# Patient Record
Sex: Female | Born: 1975 | Race: White | Hispanic: No | Marital: Married | Smoking: Never smoker
Health system: Southern US, Community
[De-identification: ages and names within clinical notes are randomized; demographics above are authoritative.]

## PROBLEM LIST (undated history)

## (undated) DIAGNOSIS — F32A Depression, unspecified: Secondary | ICD-10-CM

## (undated) DIAGNOSIS — G2581 Restless legs syndrome: Secondary | ICD-10-CM

## (undated) DIAGNOSIS — F329 Major depressive disorder, single episode, unspecified: Secondary | ICD-10-CM

## (undated) DIAGNOSIS — F319 Bipolar disorder, unspecified: Secondary | ICD-10-CM

## (undated) DIAGNOSIS — N912 Amenorrhea, unspecified: Secondary | ICD-10-CM

## (undated) DIAGNOSIS — L709 Acne, unspecified: Secondary | ICD-10-CM

## (undated) DIAGNOSIS — F419 Anxiety disorder, unspecified: Secondary | ICD-10-CM

## (undated) DIAGNOSIS — N979 Female infertility, unspecified: Secondary | ICD-10-CM

## (undated) DIAGNOSIS — E039 Hypothyroidism, unspecified: Secondary | ICD-10-CM

## (undated) HISTORY — PX: DILATION AND CURETTAGE OF UTERUS: SHX78

## (undated) HISTORY — DX: Female infertility, unspecified: N97.9

## (undated) HISTORY — PX: WISDOM TOOTH EXTRACTION: SHX21

## (undated) HISTORY — DX: Restless legs syndrome: G25.81

## (undated) HISTORY — DX: Acne, unspecified: L70.9

## (undated) HISTORY — DX: Anxiety disorder, unspecified: F41.9

## (undated) HISTORY — DX: Major depressive disorder, single episode, unspecified: F32.9

## (undated) HISTORY — DX: Amenorrhea, unspecified: N91.2

## (undated) HISTORY — DX: Depression, unspecified: F32.A

---

## 1998-03-31 ENCOUNTER — Emergency Department (HOSPITAL_COMMUNITY): Admission: EM | Admit: 1998-03-31 | Discharge: 1998-03-31 | Payer: Self-pay | Admitting: *Deleted

## 1998-06-18 ENCOUNTER — Inpatient Hospital Stay (HOSPITAL_COMMUNITY): Admission: EM | Admit: 1998-06-18 | Discharge: 1998-06-19 | Payer: Self-pay | Admitting: Emergency Medicine

## 1998-11-30 ENCOUNTER — Emergency Department (HOSPITAL_COMMUNITY): Admission: EM | Admit: 1998-11-30 | Discharge: 1998-11-30 | Payer: Self-pay | Admitting: Emergency Medicine

## 1999-03-07 ENCOUNTER — Inpatient Hospital Stay (HOSPITAL_COMMUNITY): Admission: AD | Admit: 1999-03-07 | Discharge: 1999-03-07 | Payer: Self-pay | Admitting: Obstetrics & Gynecology

## 1999-03-20 ENCOUNTER — Encounter: Payer: Self-pay | Admitting: Obstetrics & Gynecology

## 1999-03-20 ENCOUNTER — Inpatient Hospital Stay (HOSPITAL_COMMUNITY): Admission: AD | Admit: 1999-03-20 | Discharge: 1999-03-20 | Payer: Self-pay | Admitting: Obstetrics & Gynecology

## 1999-07-03 ENCOUNTER — Inpatient Hospital Stay (HOSPITAL_COMMUNITY): Admission: AD | Admit: 1999-07-03 | Discharge: 1999-07-03 | Payer: Self-pay | Admitting: Obstetrics and Gynecology

## 1999-08-13 ENCOUNTER — Observation Stay (HOSPITAL_COMMUNITY): Admission: AD | Admit: 1999-08-13 | Discharge: 1999-08-14 | Payer: Self-pay | Admitting: *Deleted

## 1999-09-11 ENCOUNTER — Encounter: Admission: RE | Admit: 1999-09-11 | Discharge: 1999-12-10 | Payer: Self-pay | Admitting: Obstetrics and Gynecology

## 1999-09-21 ENCOUNTER — Inpatient Hospital Stay (HOSPITAL_COMMUNITY): Admission: AD | Admit: 1999-09-21 | Discharge: 1999-09-21 | Payer: Self-pay | Admitting: Obstetrics and Gynecology

## 1999-09-26 ENCOUNTER — Inpatient Hospital Stay (HOSPITAL_COMMUNITY): Admission: AD | Admit: 1999-09-26 | Discharge: 1999-10-01 | Payer: Self-pay | Admitting: Obstetrics & Gynecology

## 1999-09-29 ENCOUNTER — Encounter: Payer: Self-pay | Admitting: Obstetrics and Gynecology

## 1999-10-09 ENCOUNTER — Observation Stay (HOSPITAL_COMMUNITY): Admission: AD | Admit: 1999-10-09 | Discharge: 1999-10-09 | Payer: Self-pay | Admitting: Obstetrics and Gynecology

## 1999-10-15 ENCOUNTER — Inpatient Hospital Stay (HOSPITAL_COMMUNITY): Admission: AD | Admit: 1999-10-15 | Discharge: 1999-10-17 | Payer: Self-pay | Admitting: Obstetrics & Gynecology

## 2000-03-10 ENCOUNTER — Emergency Department (HOSPITAL_COMMUNITY): Admission: EM | Admit: 2000-03-10 | Discharge: 2000-03-10 | Payer: Self-pay | Admitting: Emergency Medicine

## 2000-03-12 ENCOUNTER — Emergency Department (HOSPITAL_COMMUNITY): Admission: EM | Admit: 2000-03-12 | Discharge: 2000-03-12 | Payer: Self-pay | Admitting: Emergency Medicine

## 2002-06-18 ENCOUNTER — Other Ambulatory Visit: Admission: RE | Admit: 2002-06-18 | Discharge: 2002-06-18 | Payer: Self-pay | Admitting: Obstetrics & Gynecology

## 2004-09-12 ENCOUNTER — Other Ambulatory Visit: Admission: RE | Admit: 2004-09-12 | Discharge: 2004-09-12 | Payer: Self-pay | Admitting: Obstetrics & Gynecology

## 2004-12-28 ENCOUNTER — Ambulatory Visit: Payer: Self-pay | Admitting: Internal Medicine

## 2005-07-17 ENCOUNTER — Other Ambulatory Visit: Admission: RE | Admit: 2005-07-17 | Discharge: 2005-07-17 | Payer: Self-pay | Admitting: Obstetrics & Gynecology

## 2005-09-02 ENCOUNTER — Ambulatory Visit: Payer: Self-pay | Admitting: Internal Medicine

## 2006-05-02 ENCOUNTER — Ambulatory Visit: Payer: Self-pay | Admitting: Internal Medicine

## 2006-06-29 ENCOUNTER — Inpatient Hospital Stay (HOSPITAL_COMMUNITY): Admission: AD | Admit: 2006-06-29 | Discharge: 2006-06-29 | Payer: Self-pay | Admitting: Obstetrics and Gynecology

## 2006-11-03 ENCOUNTER — Ambulatory Visit: Payer: Self-pay | Admitting: Internal Medicine

## 2007-01-07 ENCOUNTER — Ambulatory Visit: Payer: Self-pay | Admitting: Internal Medicine

## 2007-05-05 ENCOUNTER — Ambulatory Visit: Payer: Self-pay | Admitting: Family Medicine

## 2007-08-12 DIAGNOSIS — N912 Amenorrhea, unspecified: Secondary | ICD-10-CM | POA: Insufficient documentation

## 2007-08-12 DIAGNOSIS — G43909 Migraine, unspecified, not intractable, without status migrainosus: Secondary | ICD-10-CM | POA: Insufficient documentation

## 2007-12-30 ENCOUNTER — Ambulatory Visit: Payer: Self-pay | Admitting: Internal Medicine

## 2007-12-30 DIAGNOSIS — J039 Acute tonsillitis, unspecified: Secondary | ICD-10-CM | POA: Insufficient documentation

## 2007-12-30 DIAGNOSIS — J069 Acute upper respiratory infection, unspecified: Secondary | ICD-10-CM | POA: Insufficient documentation

## 2007-12-30 LAB — CONVERTED CEMR LAB: Rapid Strep: NEGATIVE

## 2007-12-31 ENCOUNTER — Encounter: Payer: Self-pay | Admitting: Internal Medicine

## 2008-01-04 ENCOUNTER — Encounter: Payer: Self-pay | Admitting: Internal Medicine

## 2008-01-20 ENCOUNTER — Telehealth: Payer: Self-pay | Admitting: Internal Medicine

## 2008-05-24 ENCOUNTER — Telehealth: Payer: Self-pay | Admitting: Internal Medicine

## 2008-07-18 ENCOUNTER — Ambulatory Visit: Payer: Self-pay | Admitting: Internal Medicine

## 2008-07-18 DIAGNOSIS — R635 Abnormal weight gain: Secondary | ICD-10-CM | POA: Insufficient documentation

## 2008-07-18 DIAGNOSIS — N979 Female infertility, unspecified: Secondary | ICD-10-CM | POA: Insufficient documentation

## 2008-07-18 LAB — CONVERTED CEMR LAB
Thyroglobulin Ab: 38.5 (ref 0.0–60.0)
Thyroperoxidase Ab SerPl-aCnc: 2223.9 — ABNORMAL HIGH (ref 0.0–60.0)

## 2008-07-20 ENCOUNTER — Telehealth: Payer: Self-pay | Admitting: *Deleted

## 2008-07-26 ENCOUNTER — Ambulatory Visit (HOSPITAL_COMMUNITY): Admission: RE | Admit: 2008-07-26 | Discharge: 2008-07-26 | Payer: Self-pay | Admitting: Gynecology

## 2008-07-26 ENCOUNTER — Ambulatory Visit: Payer: Self-pay | Admitting: Internal Medicine

## 2008-07-26 LAB — CONVERTED CEMR LAB: Calcium, Total (PTH): 10.8 mg/dL — ABNORMAL HIGH (ref 8.4–10.5)

## 2008-07-27 ENCOUNTER — Telehealth: Payer: Self-pay | Admitting: Internal Medicine

## 2008-07-27 LAB — CONVERTED CEMR LAB
ALT: 32 units/L (ref 0–35)
AST: 30 units/L (ref 0–37)
Albumin: 4.3 g/dL (ref 3.5–5.2)
Alkaline Phosphatase: 60 units/L (ref 39–117)
BUN: 11 mg/dL (ref 6–23)
Basophils Absolute: 0 10*3/uL (ref 0.0–0.1)
Basophils Relative: 0.3 % (ref 0.0–3.0)
Bilirubin, Direct: 0.1 mg/dL (ref 0.0–0.3)
CO2: 31 meq/L (ref 19–32)
Calcium: 11.2 mg/dL — ABNORMAL HIGH (ref 8.4–10.5)
Chloride: 108 meq/L (ref 96–112)
Creatinine, Ser: 0.7 mg/dL (ref 0.4–1.2)
Eosinophils Absolute: 0.1 10*3/uL (ref 0.0–0.7)
Eosinophils Relative: 1.4 % (ref 0.0–5.0)
Free T4: 0.8 ng/dL (ref 0.6–1.6)
GFR calc Af Amer: 126 mL/min
GFR calc non Af Amer: 104 mL/min
Glucose, Bld: 89 mg/dL (ref 70–99)
HCT: 36.7 % (ref 36.0–46.0)
Hemoglobin: 13 g/dL (ref 12.0–15.0)
Lymphocytes Relative: 34 % (ref 12.0–46.0)
MCHC: 35.5 g/dL (ref 30.0–36.0)
MCV: 85.6 fL (ref 78.0–100.0)
Monocytes Absolute: 0.5 10*3/uL (ref 0.1–1.0)
Monocytes Relative: 6.6 % (ref 3.0–12.0)
Neutro Abs: 4.2 10*3/uL (ref 1.4–7.7)
Neutrophils Relative %: 57.7 % (ref 43.0–77.0)
Platelets: 288 10*3/uL (ref 150–400)
Potassium: 4 meq/L (ref 3.5–5.1)
RBC: 4.29 M/uL (ref 3.87–5.11)
RDW: 12.6 % (ref 11.5–14.6)
Sed Rate: 12 mm/hr (ref 0–22)
Sodium: 142 meq/L (ref 135–145)
T3, Free: 3.6 pg/mL (ref 2.3–4.2)
TSH: 4.24 microintl units/mL (ref 0.35–5.50)
Total Bilirubin: 0.5 mg/dL (ref 0.3–1.2)
Total Protein: 7.5 g/dL (ref 6.0–8.3)
WBC: 7.3 10*3/uL (ref 4.5–10.5)

## 2008-07-28 ENCOUNTER — Telehealth: Payer: Self-pay | Admitting: *Deleted

## 2008-08-05 ENCOUNTER — Telehealth: Payer: Self-pay | Admitting: Internal Medicine

## 2008-08-15 ENCOUNTER — Ambulatory Visit: Payer: Self-pay | Admitting: Internal Medicine

## 2008-08-15 DIAGNOSIS — J019 Acute sinusitis, unspecified: Secondary | ICD-10-CM | POA: Insufficient documentation

## 2008-12-05 ENCOUNTER — Encounter: Payer: Self-pay | Admitting: Internal Medicine

## 2009-01-02 ENCOUNTER — Ambulatory Visit: Payer: Self-pay | Admitting: Internal Medicine

## 2009-01-02 DIAGNOSIS — E069 Thyroiditis, unspecified: Secondary | ICD-10-CM | POA: Insufficient documentation

## 2009-01-02 DIAGNOSIS — R002 Palpitations: Secondary | ICD-10-CM | POA: Insufficient documentation

## 2009-01-03 ENCOUNTER — Encounter: Payer: Self-pay | Admitting: Internal Medicine

## 2009-01-03 LAB — CONVERTED CEMR LAB
Calcium, Total (PTH): 11.2 mg/dL — ABNORMAL HIGH (ref 8.4–10.5)
PTH: 46.8 pg/mL (ref 14.0–72.0)

## 2009-01-04 ENCOUNTER — Telehealth: Payer: Self-pay | Admitting: Internal Medicine

## 2009-01-05 ENCOUNTER — Ambulatory Visit: Payer: Self-pay | Admitting: Endocrinology

## 2009-01-05 DIAGNOSIS — R209 Unspecified disturbances of skin sensation: Secondary | ICD-10-CM | POA: Insufficient documentation

## 2009-01-05 DIAGNOSIS — E559 Vitamin D deficiency, unspecified: Secondary | ICD-10-CM | POA: Insufficient documentation

## 2009-01-05 LAB — CONVERTED CEMR LAB: Vitamin B-12: 333 pg/mL (ref 211–911)

## 2009-01-06 ENCOUNTER — Encounter: Payer: Self-pay | Admitting: Endocrinology

## 2009-01-06 LAB — CONVERTED CEMR LAB: Vit D, 25-Hydroxy: 28 ng/mL — ABNORMAL LOW (ref 30–89)

## 2009-01-09 ENCOUNTER — Encounter: Payer: Self-pay | Admitting: Endocrinology

## 2009-01-10 ENCOUNTER — Telehealth: Payer: Self-pay | Admitting: Endocrinology

## 2009-01-11 ENCOUNTER — Encounter: Payer: Self-pay | Admitting: Internal Medicine

## 2009-01-11 ENCOUNTER — Ambulatory Visit: Payer: Self-pay

## 2009-01-17 ENCOUNTER — Telehealth: Payer: Self-pay | Admitting: Internal Medicine

## 2009-10-27 ENCOUNTER — Encounter: Payer: Self-pay | Admitting: *Deleted

## 2009-10-27 ENCOUNTER — Encounter: Payer: Self-pay | Admitting: Internal Medicine

## 2009-11-06 ENCOUNTER — Ambulatory Visit: Payer: Self-pay | Admitting: Internal Medicine

## 2009-11-08 ENCOUNTER — Telehealth: Payer: Self-pay | Admitting: Endocrinology

## 2009-11-15 ENCOUNTER — Encounter: Payer: Self-pay | Admitting: *Deleted

## 2010-01-01 ENCOUNTER — Telehealth: Payer: Self-pay | Admitting: Internal Medicine

## 2010-12-16 LAB — CONVERTED CEMR LAB
CO2: 28 meq/L (ref 19–32)
Calcium: 10.6 mg/dL — ABNORMAL HIGH (ref 8.4–10.5)
Chloride: 108 meq/L (ref 96–112)
Creatinine, Ser: 0.6 mg/dL (ref 0.4–1.2)
Free T4: 0.6 ng/dL (ref 0.6–1.6)
Glucose, Bld: 81 mg/dL (ref 70–99)

## 2010-12-18 NOTE — Progress Notes (Signed)
Summary: yeast infection?  Phone Note Call from Patient   Caller: Patient Call For: Madelin Headings MD Complaint: Urinary/GYN Problems Summary of Call: Pt is asking for Difluclan for yeast infection (white discharge with itching and redness).  Was on an  antibiotic about 3 weeks ago. [phone] Initial call taken by: Lynann Beaver CMA,  January 01, 2010 10:28 AM  Follow-up for Phone Call        can use otc monistat   ,   or diflucan 150 Disp # 1  1 by mouth x1 . both work about the same.    recheck if persistent and progressive   symptoms Follow-up by: Madelin Headings MD,  January 01, 2010 5:39 PM    New/Updated Medications: DIFLUCAN 150 MG TABS (FLUCONAZOLE) one by mouth now Prescriptions: DIFLUCAN 150 MG TABS (FLUCONAZOLE) one by mouth now  #1 x 9   Entered by:   Lynann Beaver CMA   Authorized by:   Madelin Headings MD   Signed by:   Lynann Beaver CMA on 01/02/2010   Method used:   Electronically to        [facility]  [address] (retail)       [address]       [place], [place]  [address]       Ph: [phone]       Fax: [phone]   RxID:   [id number]  Pt. notified.

## 2011-01-02 ENCOUNTER — Other Ambulatory Visit: Payer: Self-pay | Admitting: Internal Medicine

## 2011-01-02 ENCOUNTER — Encounter: Payer: Self-pay | Admitting: Internal Medicine

## 2011-01-02 ENCOUNTER — Ambulatory Visit (INDEPENDENT_AMBULATORY_CARE_PROVIDER_SITE_OTHER): Payer: BC Managed Care – PPO | Admitting: Internal Medicine

## 2011-01-02 ENCOUNTER — Telehealth: Payer: Self-pay | Admitting: Internal Medicine

## 2011-01-02 DIAGNOSIS — R5381 Other malaise: Secondary | ICD-10-CM

## 2011-01-02 DIAGNOSIS — R5383 Other fatigue: Secondary | ICD-10-CM

## 2011-01-02 DIAGNOSIS — R252 Cramp and spasm: Secondary | ICD-10-CM

## 2011-01-02 DIAGNOSIS — E069 Thyroiditis, unspecified: Secondary | ICD-10-CM

## 2011-01-02 DIAGNOSIS — R002 Palpitations: Secondary | ICD-10-CM

## 2011-01-02 DIAGNOSIS — R635 Abnormal weight gain: Secondary | ICD-10-CM

## 2011-01-02 NOTE — Assessment & Plan Note (Signed)
Newer onset   R/o metabolic iron defic.

## 2011-01-02 NOTE — Progress Notes (Signed)
  Subjective:    Patient ID: Erica Larson, female    DOB: 1976-04-18, 35 y.o.   MRN: [id number]  HPI  patient comes in as an acute work in today upon request. She is having problems over the last couple weeks of extreme fatigue feeling like she has to go to sleep all the time and just exhausted. Has been one or 2 days where she felt a little off balance in the head but no falling treated due to his vertigo no weakness numbness and tingling. Gm died 45 of copd and cva  caretaking   .passed away a month ago  Felt better and then  Getting worse  Hard to sleep enough. Prev sleep pattern.   Was irreg  With sundowing of her gm .   She slept through the night the last week to 10 days but is still feeling tired. Her last visit with [place] was almost a year ago. Since that time she's been generally well went through infertility  Treatments with Dr. Vella Redhead that she is no longer going through and also sees the bariatric clinic at times. She believes her last lab work was in the fall. Pupils told her it was okay. She is no longer on thyroid medicine.  Review of Systems No fever, no weight loss, vision hearing  And cp sob.   Seeing nutritionist.   And jhas lost  18 pounds  .   Exercising.   Was going to the bariatic clinic .   Only ocassionally.   felt swimmy headed  drying hair and then hard to catch breath and then better and then  recurred when sitting at desk.    Increasing leg cramps mostly at night no numbness. Past Medical History  Diagnosis Date  . 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 Thyroiditis   . Acne    History reviewed. No pertinent past surgical history.  reports that she has never smoked. She does not have any smokeless tobacco history on file. She reports that she does not drink alcohol or use illicit drugs. family history includes Heart disease in her maternal grandmother; Nephrolithiasis in her father and mother; and Thyroid disease in her mother and sister.       Objective:   Physical Exam  well-developed well-nourished in no acute distress appears generally well slight proptosis but noted light her EOMs issue. HEENT normocephalic TMs clear eyes PERRLA EOMs full nares patent OP clear teeth in good repair neck without bruits lymphadenopathy thyroid is easily palpable nontender no nodules.    Chest: CTA BSE equal no wheezes rales or rhonchi. Cv: S1-S2 no gallops or murmurs peripheral pulses present without delay negative CCE.  Neurologic cranial nerves 3 through 12 appear intact no motor deficits Romberg negative heel-to-toe good finger to nose normal no tremor DTRs not abnormal and symmetrical.    Cognition and affect normal.       Assessment & Plan:    Fatigue:   probably reactive but need to rule out endocrine metabolic cause with her history of thyroiditis and hypercalcemia.  Also with her leg  cramps

## 2011-01-02 NOTE — Assessment & Plan Note (Signed)
recheck labs   Eyes  Look a bit proptotic but no lid lag.   Has lost 20 # intentionally and no other sx but sleepiness and fatigue.

## 2011-01-02 NOTE — Assessment & Plan Note (Signed)
better 

## 2011-01-02 NOTE — Assessment & Plan Note (Signed)
Improved    Had echo in past   Poss from  thryoid issue

## 2011-01-02 NOTE — Telephone Encounter (Signed)
Pt called and is complaining of extreme fatigue and not being able to sleep. This has been occuring for approx 2 wks.  Pt is req work in appt asap to see Dr Fabian Sharp.   Pls advise.

## 2011-01-02 NOTE — Patient Instructions (Signed)
Will notify you  of labs when available.     You have sleep deprivation still    A problem

## 2011-01-02 NOTE — Assessment & Plan Note (Signed)
Seems like increase sleep needs  And poss exhaustion and  Recovery from care taking and gm death and sleep deprivation  But need to recheck tyroid and calcium and iron status  With hx of same and some rls type sx.

## 2011-01-02 NOTE — Assessment & Plan Note (Signed)
Recheck labs today. 

## 2011-01-03 LAB — CBC WITH DIFFERENTIAL/PLATELET
Basophils Absolute: 0 10*3/uL (ref 0.0–0.1)
HCT: 38.3 % (ref 36.0–46.0)
Hemoglobin: 13 g/dL (ref 12.0–15.0)
Lymphs Abs: 2 10*3/uL (ref 0.7–4.0)
MCV: 86.8 fl (ref 78.0–100.0)
Monocytes Absolute: 0.4 10*3/uL (ref 0.1–1.0)
Monocytes Relative: 7 % (ref 3.0–12.0)
Neutro Abs: 3.2 10*3/uL (ref 1.4–7.7)
Platelets: 273 10*3/uL (ref 150.0–400.0)
RDW: 13.4 % (ref 11.5–14.6)

## 2011-01-03 LAB — BASIC METABOLIC PANEL
CO2: 27 mEq/L (ref 19–32)
Calcium: 10.7 mg/dL — ABNORMAL HIGH (ref 8.4–10.5)
Creatinine, Ser: 0.7 mg/dL (ref 0.4–1.2)
GFR: 102.04 mL/min (ref >60.00)
Glucose, Bld: 92 mg/dL (ref 70–99)

## 2011-01-03 LAB — SEDIMENTATION RATE: Sed Rate: 10 mm/hr (ref 0–22)

## 2011-01-03 LAB — IRON AND TIBC
%SAT: 21 % (ref 20–55)
Iron: 68 ug/dL (ref 42–145)
UIBC: 255 ug/dL

## 2011-01-03 LAB — FERRITIN: Ferritin: 37.5 ng/mL (ref 10.0–291.0)

## 2011-01-07 ENCOUNTER — Telehealth: Payer: Self-pay | Admitting: *Deleted

## 2011-01-07 NOTE — Telephone Encounter (Signed)
Tried to call pt back but was unable to leave a message.

## 2011-01-07 NOTE — Telephone Encounter (Signed)
Pt is returning shannon call please call [phone]

## 2011-01-07 NOTE — Telephone Encounter (Signed)
Message copied by Tor Netters on Mon Jan 07, 2011  9:12 AM ------      Message from: [facility] [place] [facility]      Created: Fri Jan 04, 2011  4:16 PM       Tell patient that labs are normal  except her calcium is still slightly  elevated .        Unsure if related to how she feels but would want you to follow up again about this  with endocrinology   We can do a referral back to endocrinologist of her choice.         Also because her iron level is low normal can try adding iron supplement daily to see if it helps  her legs at night .              Can follow up   Here if fatigue is not getting better after a month or so.

## 2011-01-07 NOTE — Telephone Encounter (Signed)
Left message to call back  

## 2011-01-07 NOTE — Telephone Encounter (Signed)
Pt aware of results. Pt wants to discuss going to a Endocrine with her husband and will call back to let us know.

## 2011-04-05 NOTE — Assessment & Plan Note (Signed)
[facility]                                 ON-CALL NOTE   NAME:Erica Larson, AKANKSHA                        MRN:          [id number]  DATE:09/20/2007                            DOB:          1976-07-19    PHONE NUMBER:  [phone]   SUBJECTIVE:  The patient complains of sharp pain in the left breast with  the right arm being numb.  She started this morning when she woke up  with stabbing pain through the chest on the left side.  She took an  aspirin, went to church, and it improved slightly.  She came home and  slept for a little while and now has woken up with numbness of her right  arm.  While talking with me, she is complaining of numbness of her left  leg as well.   OBJECTIVE:  Chest pain of unknown etiology.   PLAN:  With the constellation of symptoms she has, I do not know what is  going on.  She sounds very comfortable verbally over the phone, but  would have her go to the emergency room for evaluation.   PRIMARY CARE PHYSICIAN:  Neta Mends. Panosh, M.D., home office is  [place].     Arta Silence, MD  Electronically Signed    RNS/MedQ  DD: 09/20/2007  DT: 09/21/2007  Job #: [id number]

## 2011-04-05 NOTE — Discharge Summary (Signed)
[facility]  Patient:    Erica Larson Visit Number: [id number] MRN: [id number]          Service Type: NMG Location: [facility] Attending Physician:  Osborn Coho Dictated by:   Leilani Able, P.A. Admit Date:  09/11/1999 Disc. Date: 10/09/99                             Discharge Summary  FINAL DIAGNOSIS:              35+ week gestation in questionable labor.  This 35 year old, G1, P0, was admitted at 35+ weeks.  The patients prenatal course was complicated by gestational diabetes mellitus which was diet controlled and  history of preterm labor.  The patient was complaining of contractions.  Her cervix was 3 cm dilated, 50% effaced, and a -2 station.  The patient was questionably n early labor.  She was admitted for observation at this point.  The patient was allowed to walk.  She returned without any cervical change.  At this point, she was discharged home undelivered.  She was sent home with one Ambien 10 mg p.o. to take when she gets home if needed and told to follow up for her next scheduled appointment at the office. Dictated by:   Leilani Able, P.A. Attending Physician:  Osborn Coho DD:  11/16/99 TD:  11/17/99 Job: [id number]

## 2011-04-05 NOTE — Discharge Summary (Signed)
[facility]  Patient:    Erica Larson                          MRN: [id number] Adm. Date:  [id number] Disc. Date: 10/01/99 Attending:  Osborn Coho Dictator:   Leilani Able, P.A.                           Discharge Summary  FINAL DIAGNOSIS:              Intrauterine pregnancy at 34 weeks estimated gestational age, preterm labor.  HISTORY:                      This 35 year old G1, P0 was admitted at around 33-6/7ths weeks in preterm labor.  The patient had had multiple episodes of preterm labor since [redacted] weeks gestation.  She was treated with hospital admission, subcu  terbutaline, oral terbutaline, and bed rest. Today she presented to the office ith regular uterine contractions.  Her cervix was noted to be about 1-2 cm dilated, 50% effaced, and a full lower segment.  The patient was sent to triage where her contractions were increasing.  They were regular and painful.  The patient did ot have any response with subcu terbutaline at this point and therefore was admitted for IV magnesium sulfate.  HOSPITAL COURSE:              The patient was admitted, started on magnesium sulfate, was begun on betamethasone protocol and started on Unasyn 1 g q.6h. The patients contractions resolved and on November 9, she was stopped on her magnesium sulfate and started on Procardia but her contractions restarted again and the magnesium sulfate was begun again.  On November 11, patient had been off the magnesium sulfate for about 24 hours and was on oral terbutaline.  She started o have contractions at this point and it was felt to proceed with an amnio to check on fetal lung maturity.  The amnio revealed fetal lung maturity to be negative.  The patient was felt ready for discharge on hospital day #6.  She was doing well off her magnesium sulfate.  She was not having any contractions.  Cervix had not changed.  The patient was sent home on bed  rest, terbutaline 2.5 to 5 mg one q.4h. She was told to follow up in the office in one week. DD:  11/16/99 TD:  11/17/99 Job: [id number]

## 2011-04-10 ENCOUNTER — Encounter: Payer: Self-pay | Admitting: Internal Medicine

## 2011-04-10 ENCOUNTER — Ambulatory Visit: Payer: BC Managed Care – PPO | Admitting: Family Medicine

## 2011-04-11 ENCOUNTER — Ambulatory Visit: Payer: BC Managed Care – PPO | Admitting: Internal Medicine

## 2011-04-11 DIAGNOSIS — Z0289 Encounter for other administrative examinations: Secondary | ICD-10-CM

## 2011-06-20 ENCOUNTER — Encounter: Payer: Self-pay | Admitting: Family Medicine

## 2011-06-20 ENCOUNTER — Ambulatory Visit (INDEPENDENT_AMBULATORY_CARE_PROVIDER_SITE_OTHER): Payer: BC Managed Care – PPO | Admitting: Family Medicine

## 2011-06-20 VITALS — BP 100/70 | Temp 98.6°F | Wt 154.0 lb

## 2011-06-20 DIAGNOSIS — IMO0002 Reserved for concepts with insufficient information to code with codable children: Secondary | ICD-10-CM

## 2011-06-20 DIAGNOSIS — M545 Low back pain: Secondary | ICD-10-CM

## 2011-06-20 DIAGNOSIS — M79605 Pain in left leg: Secondary | ICD-10-CM

## 2011-06-20 MED ORDER — TRAMADOL HCL 50 MG PO TABS: 50.0000 mg | ORAL_TABLET | Freq: Four times a day (QID) | ORAL | Status: AC | PRN

## 2011-06-20 NOTE — Patient Instructions (Signed)
Do stretches as instructed. May continue with ibuprofen. Be in touch in 2 weeks if no better and sooner if any weakness or worsening pain.

## 2011-06-20 NOTE — Progress Notes (Signed)
  Subjective:    Patient ID: Erica Larson, female    DOB: Jul 16, 1976, 35 y.o.   MRN: [id number]  HPI Low back pain. Duration 4 weeks. Location left lumbar. Over the past several days radiation left anterior thigh down to the knee. No weakness. No numbness. No urine or stool incontinence. Initially thought UTI went to urgent care with normal urine. Pain is constant. Sharp stabbing pain. 7-8/10 in severity. Took husband's tramadol which helped some. No history of injury. Symptoms worse at night when supine. Advil without relief. No prior history of back problems. No appetite or weight changes.   Review of Systems  Constitutional: Negative for fever, chills, activity change, appetite change and unexpected weight change.  Gastrointestinal: Negative for abdominal pain.  Genitourinary: Negative for dysuria.  Musculoskeletal: Positive for back pain. Negative for arthralgias.  Skin: Negative for rash.  Neurological: Negative for weakness.  Hematological: Negative for adenopathy.       Objective:   Physical Exam  Constitutional: She appears well-developed and well-nourished. No distress.  Cardiovascular: Normal rate and regular rhythm.   Pulmonary/Chest: Effort normal and breath sounds normal. No respiratory distress. She has no wheezes. She has no rales.  Musculoskeletal: She exhibits no edema.       Straight leg raise left produces some pain upper thigh region.  tenderness left lower lumbar region  Neurological:       Full-strength lower extremities. Symmetric reflexes knee and ankle bilaterally No muscle atrophy  Skin: No rash noted.          Assessment & Plan:  Low-back pain with left radiculopathy symptoms. Nonfocal neuro exam. Symptoms suggest possible disc bulge or herniation. Tramadol 1-2 every 6 hours for pain relief. Continue Advil. Simple extension stretches given. Consider MRI lumbosacral spine in 2 weeks if no better and sooner as needed

## 2011-12-30 ENCOUNTER — Other Ambulatory Visit: Payer: Self-pay | Admitting: Family Medicine

## 2011-12-31 NOTE — Telephone Encounter (Signed)
Dr Panosh pt 

## 2011-12-31 NOTE — Telephone Encounter (Signed)
I have not prescribed this med for her and last visit with me was a year ago. Given by Dr B for a back problem .  Advise ROVto evaluate for refill .

## 2011-12-31 NOTE — Telephone Encounter (Signed)
Pt aware of this and didn't request the refill on this medication.

## 2013-02-18 ENCOUNTER — Other Ambulatory Visit (HOSPITAL_COMMUNITY): Payer: Self-pay | Admitting: Internal Medicine

## 2013-02-18 ENCOUNTER — Ambulatory Visit (HOSPITAL_COMMUNITY)
Admission: RE | Admit: 2013-02-18 | Discharge: 2013-02-18 | Disposition: A | Discharge status: Home / Self Care | Payer: BC Managed Care – PPO | Source: Ambulatory Visit | Attending: Internal Medicine | Admitting: Internal Medicine

## 2013-02-18 DIAGNOSIS — M25571 Pain in right ankle and joints of right foot: Secondary | ICD-10-CM

## 2013-02-18 DIAGNOSIS — M79609 Pain in unspecified limb: Secondary | ICD-10-CM | POA: Insufficient documentation

## 2013-09-20 ENCOUNTER — Emergency Department (INDEPENDENT_AMBULATORY_CARE_PROVIDER_SITE_OTHER)
Admission: EM | Admit: 2013-09-20 | Discharge: 2013-09-20 | Disposition: A | Discharge status: Home / Self Care | Payer: BC Managed Care – PPO | Source: Home / Self Care | Attending: Family Medicine | Admitting: Family Medicine

## 2013-09-20 ENCOUNTER — Emergency Department (HOSPITAL_COMMUNITY)
Admission: EM | Admit: 2013-09-20 | Discharge: 2013-09-20 | Discharge status: Against Medical Advice | Payer: BC Managed Care – PPO | Attending: Emergency Medicine | Admitting: Emergency Medicine

## 2013-09-20 ENCOUNTER — Encounter (HOSPITAL_COMMUNITY): Payer: Self-pay | Admitting: Emergency Medicine

## 2013-09-20 ENCOUNTER — Emergency Department (INDEPENDENT_AMBULATORY_CARE_PROVIDER_SITE_OTHER): Payer: BC Managed Care – PPO

## 2013-09-20 DIAGNOSIS — Z8679 Personal history of other diseases of the circulatory system: Secondary | ICD-10-CM | POA: Insufficient documentation

## 2013-09-20 DIAGNOSIS — Z8742 Personal history of other diseases of the female genital tract: Secondary | ICD-10-CM | POA: Insufficient documentation

## 2013-09-20 DIAGNOSIS — F419 Anxiety disorder, unspecified: Secondary | ICD-10-CM

## 2013-09-20 DIAGNOSIS — R209 Unspecified disturbances of skin sensation: Secondary | ICD-10-CM | POA: Insufficient documentation

## 2013-09-20 DIAGNOSIS — Z79899 Other long term (current) drug therapy: Secondary | ICD-10-CM | POA: Insufficient documentation

## 2013-09-20 DIAGNOSIS — R0602 Shortness of breath: Secondary | ICD-10-CM

## 2013-09-20 DIAGNOSIS — F411 Generalized anxiety disorder: Secondary | ICD-10-CM | POA: Insufficient documentation

## 2013-09-20 DIAGNOSIS — E069 Thyroiditis, unspecified: Secondary | ICD-10-CM | POA: Insufficient documentation

## 2013-09-20 DIAGNOSIS — R51 Headache: Secondary | ICD-10-CM | POA: Insufficient documentation

## 2013-09-20 DIAGNOSIS — Z872 Personal history of diseases of the skin and subcutaneous tissue: Secondary | ICD-10-CM | POA: Insufficient documentation

## 2013-09-20 LAB — GLUCOSE, CAPILLARY: Glucose-Capillary: 96 mg/dL (ref 70–99)

## 2013-09-20 MED ORDER — ALBUTEROL SULFATE (5 MG/ML) 0.5% IN NEBU
INHALATION_SOLUTION | RESPIRATORY_TRACT | Status: AC
  Filled 2013-09-20: qty 1

## 2013-09-20 MED ORDER — IPRATROPIUM BROMIDE 0.02 % IN SOLN
0.5000 mg | Freq: Once | RESPIRATORY_TRACT | Status: AC
  Administered 2013-09-20: 0.5 mg via RESPIRATORY_TRACT

## 2013-09-20 MED ORDER — ALBUTEROL SULFATE (5 MG/ML) 0.5% IN NEBU
5.0000 mg | INHALATION_SOLUTION | Freq: Once | RESPIRATORY_TRACT | Status: AC
  Administered 2013-09-20: 5 mg via RESPIRATORY_TRACT

## 2013-09-20 MED ORDER — IPRATROPIUM BROMIDE 0.02 % IN SOLN
RESPIRATORY_TRACT | Status: AC
  Filled 2013-09-20: qty 2.5

## 2013-09-20 MED ORDER — SODIUM CHLORIDE 0.9 % IV SOLN: Freq: Once | INTRAVENOUS | Status: DC

## 2013-09-20 NOTE — ED Notes (Signed)
Pt left AMA without notifying staff.  

## 2013-09-20 NOTE — ED Notes (Signed)
Patient dialed husbands number and gave phone to this nurse to talk to spouse, spouse is coming
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
## 2013-09-20 NOTE — ED Notes (Signed)
Patient agreeable to go by shuttle to ed for evaluation

## 2013-09-20 NOTE — ED Notes (Signed)
No improvement, reports numbness in extremities.  Instructed patient this is typical with her breathing pattern.
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
## 2013-09-20 NOTE — ED Notes (Signed)
CBG is 96. Notified Nurse Ashleigh.

## 2013-09-20 NOTE — ED Notes (Signed)
Pt instructed to try to slow down breathing.

## 2013-09-20 NOTE — ED Notes (Signed)
Dr. Effie Shy notified of pt departure.

## 2013-09-20 NOTE — ED Notes (Signed)
Shuttle not available
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
## 2013-09-22 ENCOUNTER — Ambulatory Visit (INDEPENDENT_AMBULATORY_CARE_PROVIDER_SITE_OTHER): Payer: BC Managed Care – PPO | Admitting: Psychiatry

## 2013-09-22 DIAGNOSIS — F419 Anxiety disorder, unspecified: Secondary | ICD-10-CM

## 2013-09-22 DIAGNOSIS — F411 Generalized anxiety disorder: Secondary | ICD-10-CM

## 2013-09-22 DIAGNOSIS — F39 Unspecified mood [affective] disorder: Secondary | ICD-10-CM

## 2013-09-24 ENCOUNTER — Encounter (HOSPITAL_COMMUNITY): Payer: Self-pay | Admitting: Psychiatry

## 2013-09-24 NOTE — Patient Instructions (Signed)
Discussed orally 
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
## 2013-09-27 ENCOUNTER — Ambulatory Visit (INDEPENDENT_AMBULATORY_CARE_PROVIDER_SITE_OTHER): Payer: BC Managed Care – PPO | Admitting: Psychiatry

## 2013-09-27 ENCOUNTER — Encounter (HOSPITAL_COMMUNITY): Payer: Self-pay | Admitting: Psychiatry

## 2013-09-27 VITALS — BP 110/80 | Ht 66.0 in | Wt 157.0 lb

## 2013-09-27 DIAGNOSIS — F410 Panic disorder [episodic paroxysmal anxiety] without agoraphobia: Secondary | ICD-10-CM

## 2013-09-27 DIAGNOSIS — F411 Generalized anxiety disorder: Secondary | ICD-10-CM

## 2013-09-27 MED ORDER — CLONAZEPAM 0.5 MG PO TABS: ORAL_TABLET | ORAL | Status: DC
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
## 2013-10-01 ENCOUNTER — Ambulatory Visit (HOSPITAL_COMMUNITY): Payer: Self-pay | Admitting: Psychiatry

## 2013-10-12 ENCOUNTER — Telehealth (HOSPITAL_COMMUNITY): Payer: Self-pay

## 2013-10-13 ENCOUNTER — Telehealth (HOSPITAL_COMMUNITY): Payer: Self-pay

## 2013-10-18 ENCOUNTER — Ambulatory Visit (INDEPENDENT_AMBULATORY_CARE_PROVIDER_SITE_OTHER): Payer: BC Managed Care – PPO | Admitting: Psychiatry

## 2013-10-18 DIAGNOSIS — F411 Generalized anxiety disorder: Secondary | ICD-10-CM

## 2013-10-18 DIAGNOSIS — F419 Anxiety disorder, unspecified: Secondary | ICD-10-CM

## 2013-10-18 DIAGNOSIS — F39 Unspecified mood [affective] disorder: Secondary | ICD-10-CM

## 2013-10-18 NOTE — Patient Instructions (Signed)
Discussed orally 
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
## 2013-10-22 ENCOUNTER — Ambulatory Visit (HOSPITAL_COMMUNITY): Payer: Self-pay | Admitting: Psychiatry

## 2013-10-25 ENCOUNTER — Ambulatory Visit (HOSPITAL_COMMUNITY): Payer: Self-pay | Admitting: Psychiatry

## 2013-11-04 ENCOUNTER — Ambulatory Visit (HOSPITAL_COMMUNITY): Payer: Self-pay | Admitting: Psychiatry

## 2013-11-05 ENCOUNTER — Ambulatory Visit (INDEPENDENT_AMBULATORY_CARE_PROVIDER_SITE_OTHER): Payer: BC Managed Care – PPO | Admitting: Psychiatry

## 2013-11-05 ENCOUNTER — Encounter (HOSPITAL_COMMUNITY): Payer: Self-pay | Admitting: Psychiatry

## 2013-11-05 VITALS — BP 110/80 | Ht 66.0 in | Wt 154.0 lb

## 2013-11-05 DIAGNOSIS — F419 Anxiety disorder, unspecified: Secondary | ICD-10-CM

## 2013-11-05 DIAGNOSIS — F410 Panic disorder [episodic paroxysmal anxiety] without agoraphobia: Secondary | ICD-10-CM

## 2013-11-05 MED ORDER — ZOLPIDEM TARTRATE 5 MG PO TABS: 5.0000 mg | ORAL_TABLET | Freq: Every evening | ORAL | Status: DC | PRN
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
## 2013-11-19 ENCOUNTER — Ambulatory Visit (HOSPITAL_COMMUNITY): Payer: Self-pay | Admitting: Psychiatry

## 2013-11-23 ENCOUNTER — Ambulatory Visit (INDEPENDENT_AMBULATORY_CARE_PROVIDER_SITE_OTHER): Payer: BC Managed Care – PPO | Admitting: Psychiatry

## 2013-11-23 DIAGNOSIS — F411 Generalized anxiety disorder: Secondary | ICD-10-CM

## 2013-11-23 DIAGNOSIS — F39 Unspecified mood [affective] disorder: Secondary | ICD-10-CM

## 2013-11-23 DIAGNOSIS — F419 Anxiety disorder, unspecified: Secondary | ICD-10-CM
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
## 2013-11-23 NOTE — Patient Instructions (Signed)
Discussed orally 

## 2013-11-25 ENCOUNTER — Ambulatory Visit (HOSPITAL_COMMUNITY): Payer: Self-pay | Admitting: Psychiatry

## 2013-12-20 ENCOUNTER — Ambulatory Visit (INDEPENDENT_AMBULATORY_CARE_PROVIDER_SITE_OTHER): Payer: BC Managed Care – PPO | Admitting: Psychiatry

## 2013-12-20 DIAGNOSIS — F411 Generalized anxiety disorder: Secondary | ICD-10-CM

## 2013-12-20 DIAGNOSIS — F419 Anxiety disorder, unspecified: Secondary | ICD-10-CM

## 2013-12-20 NOTE — Patient Instructions (Signed)
Discussed orally 
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
## 2014-01-03 ENCOUNTER — Telehealth (HOSPITAL_COMMUNITY): Payer: Self-pay | Admitting: *Deleted

## 2014-01-04 ENCOUNTER — Ambulatory Visit (HOSPITAL_COMMUNITY): Payer: Self-pay | Admitting: Psychiatry

## 2014-01-19 ENCOUNTER — Ambulatory Visit (INDEPENDENT_AMBULATORY_CARE_PROVIDER_SITE_OTHER): Payer: BC Managed Care – PPO | Admitting: Psychiatry

## 2014-01-19 DIAGNOSIS — F411 Generalized anxiety disorder: Secondary | ICD-10-CM

## 2014-01-19 DIAGNOSIS — F419 Anxiety disorder, unspecified: Secondary | ICD-10-CM
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
## 2014-01-19 NOTE — Patient Instructions (Signed)
Discussed orally 

## 2014-01-31 ENCOUNTER — Encounter (HOSPITAL_COMMUNITY): Payer: Self-pay | Admitting: Psychiatry

## 2014-01-31 ENCOUNTER — Ambulatory Visit (HOSPITAL_COMMUNITY): Payer: Self-pay | Admitting: Psychiatry

## 2014-02-02 ENCOUNTER — Ambulatory Visit (INDEPENDENT_AMBULATORY_CARE_PROVIDER_SITE_OTHER): Payer: BC Managed Care – PPO | Admitting: Psychiatry

## 2014-02-02 DIAGNOSIS — F411 Generalized anxiety disorder: Secondary | ICD-10-CM

## 2014-02-02 DIAGNOSIS — F419 Anxiety disorder, unspecified: Secondary | ICD-10-CM

## 2014-02-02 NOTE — Progress Notes (Signed)
   THERAPIST PROGRESS NOTE  Session Time: Wednesday 02/02/2014 1:05 PM -1:55 PM  Participation Level: Active  Behavioral Response: CasualAlertEuthymic  Type of Therapy: Individual Therapy  Treatment Goals addressed:       Increase self acceptance                                                          improve assertiveness skills and ability to set and maintain boundaries                                                          Improve mood and decrease anxiety response   Interventions: CBT, Assertiveness Training and Supportive  Summary: Erica Larson is a 38 y.o. female who presents  with a history of anxiety and anger outbursts that have been intermittent for several years. Patient reports often feeling enraged and out of control. She reports significant marital stress and trust issues in her marriage. Patient reports improved mood and decreased anxiety since last session 2 weeks ago. She also reports decreased marital stress as she and husband haven't argued as much as have been doing pleasant activities as a couple. Patient has increased efforts to improve assertiveness skills in communication with husband and shares examples. She also has set boundaries with her mother. Patient continues to experience anxiety and perfectionistic tendencies about self and performance. She is fearful of making mistakes, worries about other's opinions, and has tendency to seek others' approval. She shares more information regarding childhood relationship with parents including dad being very caring and supportive and mother being very critical and negative.  Suicidal/Homicidal: No  Therapist Response: Therapist works with patient to reinforce use of assertiveness skills, discuss the effects of use, begin to identify thinking blockers and helpers for effective assertion, identify automatic thoughts and initial sources along with effects on patient's functioning. Therapist provides patient with handouts to  improve assertiveness skills.  Plan: Return again in 2 weeks.   Diagnosis: Axis I: Anxiety Disorder    Axis II: Deferred    BYNUM,PEGGY, LCSW 02/02/2014

## 2014-02-02 NOTE — Patient Instructions (Signed)
Discussed orally 

## 2014-02-16 ENCOUNTER — Ambulatory Visit (INDEPENDENT_AMBULATORY_CARE_PROVIDER_SITE_OTHER): Payer: BC Managed Care – PPO | Admitting: Psychiatry

## 2014-02-16 DIAGNOSIS — F419 Anxiety disorder, unspecified: Secondary | ICD-10-CM

## 2014-02-16 DIAGNOSIS — F411 Generalized anxiety disorder: Secondary | ICD-10-CM

## 2014-02-16 NOTE — Patient Instructions (Signed)
Discussed orally 

## 2014-02-16 NOTE — Progress Notes (Signed)
   THERAPIST PROGRESS NOTE  Session Time: Wednesday 02/16/2014 02/16/2014 9:10 AM - 10:00 AM  Participation Level: Active  Behavioral Response: CasualAlertEuthymic  Type of Therapy: Individual Therapy  Treatment Goals addressed:  Increase self acceptance       Improve assertiveness skills and ability to set and maintain boundaries       Improve mood and decrease anxiety response   Interventions: CBT and Supportive  Summary: Erica Larson is a 38 y.o. female who presents with a history of anxiety and anger outbursts that have been intermittent for several years. Patient reports often feeling enraged and out of control. She reports significant marital stress and trust issues in her marriage. Patient reports continued improved mood and decreased anxiety since last session 2 weeks ago. 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 She reports husband has been more stressed and has made some snide remarks. However, patient has been able to manage this fairly well and has not had the anxiety response. She reports less concern about his approval. She's continued to maintain boundaries with her daughter.    Suicidal/Homicidal: No  Therapist Response: Therapist works with patient to process feelings, reinforce patient's efforts to set and maintain boundaries, and identify coping statements.  Plan: Return again in 3 weeks.  Diagnosis: Axis I: Anxiety Disorder    Axis II: No diagnosis    Khan Chura, LCSW 02/16/2014

## 2014-03-03 ENCOUNTER — Ambulatory Visit (INDEPENDENT_AMBULATORY_CARE_PROVIDER_SITE_OTHER): Payer: BC Managed Care – PPO | Admitting: Psychiatry

## 2014-03-03 DIAGNOSIS — F411 Generalized anxiety disorder: Secondary | ICD-10-CM

## 2014-03-03 DIAGNOSIS — F419 Anxiety disorder, unspecified: Secondary | ICD-10-CM

## 2014-03-03 NOTE — Progress Notes (Signed)
   THERAPIST PROGRESS NOTE  Session Time: Thursday 03/03/2014 9:05 AM - 9:55 AM  Participation Level: Active  Behavioral Response: CasualAlertAnxious  Type of Therapy: Individual Therapy  Treatment Goals addressed:  Increase self acceptance       Improve assertiveness skills and ability to set and maintain boundaries       Improve mood and decrease anxiety response   Interventions: Supportive  Summary: Erica Larson is a 38 y.o. female who presents with a history of anxiety and anger outbursts that have been intermittent for several years. Patient reports often feeling enraged and out of control. She reports significant marital stress and trust issues in her marriage. Patient reports increased stress since last session 2 weeks ago. She and husband have experienced increased conflict as husband continues to accuse patient of lying which she denies. She also reports feeling pressure from husband to attend one of her sessions as he doesn't think she's being honest in therapy. Patient expresses ambivalent feelings about husband attending a session.She wants marriage to improve but is fearful of husband's possible reaction as he may paint her as the bad guy and become even more argumentative after session.   Suicidal/Homicidal: No  Therapist Response: Therapist works with patient to process feelings, identify limits of her responsibility, began to explore the costs and benefits of having a session with husband, explore ways to improve distress tolerance  Plan: Return again in 2 weeks.  Diagnosis: Axis I: Anxiety Disorder NOS    Axis II: Deferred    Fadia Marlar, LCSW 03/03/2014

## 2014-03-03 NOTE — Patient Instructions (Signed)
Discussed orally 

## 2014-03-18 ENCOUNTER — Ambulatory Visit (INDEPENDENT_AMBULATORY_CARE_PROVIDER_SITE_OTHER): Payer: BC Managed Care – PPO | Admitting: Psychiatry

## 2014-03-18 DIAGNOSIS — F411 Generalized anxiety disorder: Secondary | ICD-10-CM

## 2014-03-18 DIAGNOSIS — F419 Anxiety disorder, unspecified: Secondary | ICD-10-CM

## 2014-03-18 NOTE — Progress Notes (Signed)
   THERAPIST PROGRESS NOTE  Session Time:  Friday 03/18/2014 9:05 AM - 10:00 AM  Participation Level: Active  Behavioral Response: CasualAlertAnxious  Type of Therapy: Individual Therapy  Treatment Goals addressed: Increase self acceptance  Improve assertiveness skills and ability to set and maintain boundaries  Improve mood and decrease anxiety response     Interventions: CBT and Supportive  Summary: Erica Larson is a 38 y.o. female who presents with a history of anxiety and anger outbursts that have been intermittent for several years. Patient reports often feeling enraged and out of control. She reports significant marital stress and trust issues in her marriage. She reports decreased conflict in marriage since last session. She continues to express frustration and anxiety regarding inconsistent positive interaction in marriage but remains reluctant to have joint session with husband. She fears husband will put her down and then argue after session. 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 She fears mistakes and failures,   Suicidal/Homicidal: No  Therapist Response: Therapist works with patient to process feelings, examine thought patterns, identify coping statements to overcome perfectionism. Therapist provides patient with handouts.  Plan: Return again in 4 weeks.   Diagnosis: Axis I: Anxiety Disorder NOS    Axis II: Deferred    Umberto Pavek, LCSW 03/18/2014

## 2014-03-18 NOTE — Patient Instructions (Signed)
Discussed orally 

## 2014-04-14 ENCOUNTER — Ambulatory Visit (INDEPENDENT_AMBULATORY_CARE_PROVIDER_SITE_OTHER): Payer: BC Managed Care – PPO | Admitting: Psychiatry

## 2014-04-14 DIAGNOSIS — F411 Generalized anxiety disorder: Secondary | ICD-10-CM

## 2014-04-14 DIAGNOSIS — F419 Anxiety disorder, unspecified: Secondary | ICD-10-CM

## 2014-04-14 NOTE — Progress Notes (Signed)
   THERAPIST PROGRESS NOTE  Session Time: Thursday 04/14/2014 1:05 PM - 2:00 PM  Participation Level: Active  Behavioral Response: CasualAlert/less anxious  Type of Therapy: Individual Therapy  Treatment Goals addressed: Increase self acceptance  Improve assertiveness skills and ability to set and maintain boundaries  Improve mood and decrease anxiety response    Interventions: CBT and Supportive  Summary: Erica Larson is a 38 y.o. female who presents with a history of anxiety and anger outbursts that have been intermittent for several years. Patient reports often feeling enraged and out of control. She reports significant marital stress and trust issues in her marriage. Since last session, patient reports increased awareness of perfectionistic thinking. She has begun coping statements to overcome perfectionism and reports reduced anxiety being able to let some things go. She reports continued decreased conflict in marriage but still continues to worry about husband's opinion and assumes husband blames her for everything. She continues to struggle with self-acceptance. 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 Although she is concerned about husband's potential comments, she wants to include husband in next session to improve their communication.   Suicidal/Homicidal: No  Therapist Response:Therapist works with patient to process feelings, identify automatic thoughts/core beliefs/ and effects on patient's mood/behaviors, dispute irrational core beliefs, and practice a grounding technique.   Plan: Return again in 2 weeks.  Diagnosis: Axis I: Anxiety Disorder    Axis II: No diagnosis    Maudean Hoffmann, LCSW 04/14/2014

## 2014-04-14 NOTE — Patient Instructions (Signed)
Discussed orally 

## 2014-04-28 ENCOUNTER — Ambulatory Visit (INDEPENDENT_AMBULATORY_CARE_PROVIDER_SITE_OTHER): Payer: BC Managed Care – PPO | Admitting: Psychiatry

## 2014-04-28 DIAGNOSIS — F419 Anxiety disorder, unspecified: Secondary | ICD-10-CM

## 2014-04-28 DIAGNOSIS — F411 Generalized anxiety disorder: Secondary | ICD-10-CM

## 2014-04-28 NOTE — Progress Notes (Signed)
   THERAPIST PROGRESS NOTE  Session Time: Thursday 04/28/2014 9:15 AM - 10:10 AM  Participation Level: Active  Behavioral Response: CasualAlertAnxious  Type of Therapy: Individual Therapy  Treatment Goals addressed:  Increase self acceptance          Improve assertiveness skills and ability to set and maintain boundaries                     Improve mood and decrease anxiety response      Interventions: Supportive  Summary: Erica Larson is a 38 y.o. female who presents with a history of anxiety and anger outbursts that have been intermittent for several years. Patient reports often feeling enraged and out of control. She reports significant marital stress and trust issues in her marriage. Since last session, patient reports increased efforts to use assertiveness skills and to express her concerns. She also reports decreased anxiety. Patient's husband attends part of session today and expresses frustration regarding poor communication in marriage. Both patient and husband admit trust issues due to communication problems.  Suicidal/Homicidal: No  Therapist Response: Therapist works with patient and husband to facilitate improved communication including empathic skills and more support for patient. Therapist also refers patient and husband to psychologist Dr. Kieth Brightly for family therapy.  Plan: Return again in 2 weeks.  Diagnosis: Axis I: Anxiety Disorder NOS    Axis II: No diagnosis    Shamecka Hocutt, LCSW 04/28/2014

## 2014-04-28 NOTE — Patient Instructions (Signed)
Discussed orally 

## 2014-05-13 ENCOUNTER — Ambulatory Visit (INDEPENDENT_AMBULATORY_CARE_PROVIDER_SITE_OTHER): Payer: BC Managed Care – PPO | Admitting: Psychiatry

## 2014-05-13 DIAGNOSIS — F411 Generalized anxiety disorder: Secondary | ICD-10-CM

## 2014-05-13 NOTE — Patient Instructions (Signed)
Discussed orally 

## 2014-05-13 NOTE — Progress Notes (Signed)
   THERAPIST PROGRESS NOTE  Session Time: Friday 05/13/2014 9:10 AM -10:00 AM  Participation Level: Active  Behavioral Response: CasualAlertAnxious  Type of Therapy: Individual Therapy  Treatment Goals addressed:   Increase self acceptance            Improve assertiveness skills and ability to set and maintain boundaries        Improve mood and decrease anxiety response    Interventions: CBT and Supportive  Summary: Erica Larson is a 38 y.o. female who presents with a history of anxiety and anger outbursts that have been intermittent for several years. Patient reports often feeling enraged and out of control. She reports significant marital stress and trust issues in her marriage.  Since last session, patient reports increased stress related to increased marital conflict. Per patient's report, husband is becoming increasingly negative and continues to blame her for everything. Patient has used assertiveness skills. However, she expresses discouragement about marriage as husband constantly brings up the past per patient's report. Patient and husband are scheduled to see Dr. Kieth Brightlyodenbough for family therapy.  Suicidal/Homicidal: No  Therapist Response: Therapist works with patient to process feelings, reinforce use of assertiveness skills, identify ways to improve empathic skills, reinforce self-care.  Plan: Return again in 3 weeks.  Diagnosis: Axis I: Anxiety Disorder NOS    Axis II: No diagnosis    BYNUM,PEGGY, LCSW 05/13/2014

## 2014-05-31 ENCOUNTER — Ambulatory Visit (INDEPENDENT_AMBULATORY_CARE_PROVIDER_SITE_OTHER): Payer: BC Managed Care – PPO | Admitting: Psychology

## 2014-05-31 DIAGNOSIS — F411 Generalized anxiety disorder: Secondary | ICD-10-CM

## 2014-06-06 ENCOUNTER — Ambulatory Visit (HOSPITAL_COMMUNITY): Payer: Self-pay | Admitting: Psychiatry

## 2014-06-15 ENCOUNTER — Ambulatory Visit (INDEPENDENT_AMBULATORY_CARE_PROVIDER_SITE_OTHER): Payer: BC Managed Care – PPO | Admitting: Psychiatry

## 2014-06-15 DIAGNOSIS — F411 Generalized anxiety disorder: Secondary | ICD-10-CM

## 2014-06-15 NOTE — Progress Notes (Signed)
   THERAPIST PROGRESS NOTE  Session Time: Wednesday 06/15/2014 2:15 PM - 3:00 PM  Participation Level: Active  Behavioral Response: CasualAlertAnxious and Depressed  Type of Therapy: Individual Therapy  Treatment Goals addressed: Increase self acceptance  Improve assertiveness skills and ability to set and maintain boundaries  Improve mood and decrease anxiety response   Interventions: Supportive  Summary: Erica Larson is a 38 y.o. female who presents with a history of anxiety and anger outbursts that have been intermittent for several years. Patient reports often feeling enraged and out of control. She reports significant marital stress and trust issues in her marriage.    Patient reports feeling better since last session until last week when she and husband had conflict. He became so angry with patient that he has barely spoken to her since Sunday per her report. Husband accuses her of lying, cheating, and being irresponsible regarding money any time they have conflict per patient's report. They are scheduled to see Dr. Kieth Brightly for their second session on 07/10/2014. Patient expresses frustration and anger regarding the marriage. She reports often agreeing with husband just to stop husband from arguing.  Suicidal/Homicidal: No  Therapist Response: Therapist works with patient to process feelings, encourage patient to resume self-care efforts, identify ways to set and maintain boundaries.  Plan: Return again in 2 weeks.  Diagnosis: Axis I: Anxiety Disorder NOS    Axis II: No diagnosis    Analeese Andreatta, LCSW 06/15/2014

## 2014-06-15 NOTE — Patient Instructions (Signed)
Discussed orally 

## 2014-06-20 ENCOUNTER — Ambulatory Visit (HOSPITAL_COMMUNITY): Payer: Self-pay | Admitting: Psychology

## 2014-06-30 ENCOUNTER — Ambulatory Visit (INDEPENDENT_AMBULATORY_CARE_PROVIDER_SITE_OTHER): Payer: BC Managed Care – PPO | Admitting: Psychiatry

## 2014-06-30 DIAGNOSIS — F411 Generalized anxiety disorder: Secondary | ICD-10-CM

## 2014-06-30 NOTE — Patient Instructions (Signed)
Discussed orally 

## 2014-06-30 NOTE — Progress Notes (Signed)
   THERAPIST PROGRESS NOTE  Session Time:  Thursday 06/30/2014 2:10 PM - 2:55 PM  Participation Level: Active  Behavioral Response: CasualAlertEuthymic  Type of Therapy: Individual Therapy  Treatment Goals addressed: Increase self acceptance  Improve assertiveness skills and ability to set and maintain boundaries  Improve mood and decrease anxiety response    Interventions: CBT and Supportive  Summary: Erica Larson is a 38 y.o. female who presents with a history of anxiety and anger outbursts that have been intermittent for several years. Patient reports often feeling enraged and out of control. She reports significant marital stress and trust issues in her marriage.  Since last session, patient reports stress related to recent biopsy of uterus and grandmother being hospitalized. She's less stressed about grandmother now as she is now home. She expresses appropriate concern regarding her health and is nervous as she hasn't yet received test results. She reports decreased marital stress since she and husband had conversation where they made an agreement to avoid bringing up the past. Prior to this agreement, patient reports being very assertive in communication with husband. She reports being able to do this because she stopped fearing husband's response. Patient 's statements in session reflect increased self-acceptance and self-confidence. Patient also reports improved to use of assertiveness skills in her relationship with her mother. Patient states feeling much better about self and her actions.  Suicidal/Homicidal: No  Therapist Response: Therapist works with patient to process feelings, reinforce her use of assertiveness skills, discuss patient's progress   Plan: Return again in 3 weeks.  Diagnosis: Axis I: Anxiety Disorder NOS    Axis II: No diagnosis    Seila Liston, LCSW 06/30/2014

## 2014-07-05 ENCOUNTER — Ambulatory Visit (INDEPENDENT_AMBULATORY_CARE_PROVIDER_SITE_OTHER): Payer: BC Managed Care – PPO | Admitting: Psychology

## 2014-07-05 DIAGNOSIS — F39 Unspecified mood [affective] disorder: Secondary | ICD-10-CM

## 2014-07-05 DIAGNOSIS — F411 Generalized anxiety disorder: Secondary | ICD-10-CM

## 2014-07-14 ENCOUNTER — Telehealth (HOSPITAL_COMMUNITY): Payer: Self-pay | Admitting: *Deleted

## 2014-07-14 NOTE — Telephone Encounter (Signed)
Pt calling to see if she could talk to Florencia Reasons due to her having a really bad week. Pt asking if she could come in tomorrow (07-15-14) but [facility] is booked. Pt states she is not of any danger to herself or anyone else.  Pt would like for Peggy to call her back at [phone].

## 2014-07-21 ENCOUNTER — Ambulatory Visit (INDEPENDENT_AMBULATORY_CARE_PROVIDER_SITE_OTHER): Payer: BC Managed Care – PPO | Admitting: Psychiatry

## 2014-07-21 DIAGNOSIS — F411 Generalized anxiety disorder: Secondary | ICD-10-CM

## 2014-07-21 NOTE — Progress Notes (Signed)
   THERAPIST PROGRESS NOTE  Session Time: Thursday 07/21/2014 10:05 AM 10:55 AM  Participation Level: Active  Behavioral Response: CasualAlertAnxious  Type of Therapy: Individual Therapy  Treatment Goals addressed:  Increase self acceptance  Improve assertiveness skills and ability to set and maintain boundaries  Improve mood and decrease anxiety response   Interventions: CBT and Supportive  Summary: Erica Larson is a 38 y.o. female who presents with a history of anxiety and anger outbursts that have been intermittent for several years. Patient reports often feeling enraged and out of control. She reports significant marital stress and trust issues in her marriage.  Patient reports increased stress and anxiety since last session due to  increased marital conflict. She is experiencing nervousness, indecisiveness, and poor concentration. She expresses frustration as husband has resumed bringing up statements from the past and continues to blame her for their issues per her report. Patient and husband are scheduled to see Dr. Sima Matas to marital therapy on 07/27/2014. Patient reports asking husband to attend a session with their pastor but he refused. However, patient met with the pastor seeking spiritual guidance. Patient shares more information today about spirituality.   Suicidal/Homicidal: No  Therapist Response: Therapist works with patient to identify and verbalize her feelings, identify and challenge negative thought patterns, identifying coping statements, identified 3 elements of emotions and ways to cope, and identify ways to avoid escalating conflict  Plan: Return again in 2 weeks.  Diagnosis: Axis I: Anxiety Disorder NOS    Axis II: No diagnosis    Brayson Livesey, LCSW 07/21/2014

## 2014-07-21 NOTE — Patient Instructions (Signed)
Discussed orally 

## 2014-07-27 ENCOUNTER — Ambulatory Visit (INDEPENDENT_AMBULATORY_CARE_PROVIDER_SITE_OTHER): Payer: BC Managed Care – PPO | Admitting: Psychology

## 2014-07-27 DIAGNOSIS — F411 Generalized anxiety disorder: Secondary | ICD-10-CM

## 2014-08-05 ENCOUNTER — Ambulatory Visit (INDEPENDENT_AMBULATORY_CARE_PROVIDER_SITE_OTHER): Payer: BC Managed Care – PPO | Admitting: Psychiatry

## 2014-08-05 DIAGNOSIS — F411 Generalized anxiety disorder: Secondary | ICD-10-CM

## 2014-08-05 NOTE — Progress Notes (Signed)
   THERAPIST PROGRESS NOTE  Session Time: Friday 08/05/2014 11:05 AM - 11:55 AM  Participation Level: Active  Behavioral Response: CasualAlertAnxious  Type of Therapy: Individual Therapy  Treatment Goals addressed: Increase self acceptance  Improve assertiveness skills and ability to set and maintain boundaries  Improve mood and decrease anxiety response   Interventions: CBT and Supportive  Summary: Erica Larson is a 38 y.o. female who presents with a history of anxiety and anger outbursts that have been intermittent for several years. Patient reports often feeling enraged and out of control. She reports significant marital stress and trust issues in her marriage.  Patient reports continued  stress and anxiety since last session due to continued marital conflict. She continues to experience  nervousness, indecisiveness, and poor concentration. She expresses frustration as husband continuesbringing up statements from the past and continues to blame her for their issues per her report. Patient admits neglecting self-care and beginning to have old thought patterns which inhibit her ability to be assertive and set/maintain boundaries.   Suicidal/Homicidal: No  Therapist Response: Therapist works with patient to process feelings, identify and challenge thinking errors, identify coping statements,  identify ways to resume self-care, identify ways to set and maintain boundaries.  Plan: Return again in 3 weeks.  Diagnosis: Axis I: Anxiety Disorder    Axis II: Deferred    Amos Micheals, LCSW 08/05/2014

## 2014-08-05 NOTE — Patient Instructions (Signed)
Discussed orally 

## 2014-08-25 ENCOUNTER — Ambulatory Visit (INDEPENDENT_AMBULATORY_CARE_PROVIDER_SITE_OTHER): Payer: BC Managed Care – PPO | Admitting: Psychiatry

## 2014-08-25 DIAGNOSIS — F419 Anxiety disorder, unspecified: Secondary | ICD-10-CM

## 2014-08-26 NOTE — Patient Instructions (Signed)
Discussed orally 

## 2014-08-26 NOTE — Progress Notes (Signed)
   THERAPIST PROGRESS NOTE  Session Time: Thursday 08/25/2014 11:10 AM - 12:00 PM  Participation Level: Active  Behavioral Response: CasualAlertAnxious  Type of Therapy: Individual Therapy  Treatment Goals addressed: Increase self acceptance  Improve assertiveness skills and ability to set and maintain boundaries  Improve mood and decrease anxiety response   Interventions: CBT and Supportive  Summary: Erica Larson is a 38 y.o. female who presents with a history of anxiety and anger outbursts that have been intermittent for several years. Patient reports often feeling enraged and out of control. She reports significant marital stress and trust issues in her marriage.   Patient reports decreased anxiety and stress since last session. Marital stress has decreased although patient and husband continued to have conflict but it has been intense. Patient has been more assertive in communicating with husband and cites several examples. She also has set boundaries and reports discontinuing to take responsibility for her husband's issues. She reports learning more information about husband's financial transactions and business has prompted her to examine previous situations in which husband has blamed her for couples financial issues. Patient has been using coping statements successfully.   Suicidal/Homicidal: No  Therapist Response: Therapist works with patient to process feelings, praises patient's use of assertiveness skills, identify triggers and early warning signs of anxiety and ways to intervene  Plan: Return again in 4 weeks.  Diagnosis: Axis I: Anxiety Disorder NOS    Axis II: No diagnosis    Cortlyn Cannell, LCSW 08/26/2014

## 2014-09-14 ENCOUNTER — Ambulatory Visit (INDEPENDENT_AMBULATORY_CARE_PROVIDER_SITE_OTHER): Payer: BC Managed Care – PPO | Admitting: Psychiatry

## 2014-09-14 DIAGNOSIS — F39 Unspecified mood [affective] disorder: Secondary | ICD-10-CM

## 2014-09-14 DIAGNOSIS — F419 Anxiety disorder, unspecified: Secondary | ICD-10-CM

## 2014-09-14 NOTE — Patient Instructions (Signed)
Discussed orally 
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
## 2014-09-21 ENCOUNTER — Ambulatory Visit (HOSPITAL_COMMUNITY): Payer: Self-pay | Admitting: Psychiatry

## 2014-09-28 ENCOUNTER — Ambulatory Visit (HOSPITAL_COMMUNITY): Payer: Self-pay | Admitting: Psychiatry

## 2014-10-24 ENCOUNTER — Ambulatory Visit (HOSPITAL_COMMUNITY): Payer: Self-pay | Admitting: Psychiatry

## 2014-10-26 ENCOUNTER — Encounter (HOSPITAL_COMMUNITY): Payer: Self-pay | Admitting: Psychology

## 2014-10-26 NOTE — Progress Notes (Signed)
   Continue to work on psychotherapeutic issues were around family therapy/marital therapy. The patient and her husband continue to struggle with they are improving to some degree. The patient has acknowledged that things that she is done with stressful nightmares but has been continues to essentially externalized project blame.

## 2014-10-26 NOTE — Psych (Signed)
The patient has had a lot of anxiety related to situation with husband and we worked on marital issues with the patient and her husband

## 2014-10-26 NOTE — Progress Notes (Signed)
  The patient and her husband came in for marital therapy due to ongoing conflicts

## 2014-10-26 NOTE — Progress Notes (Signed)
  First appointment with patient and her husband.  A lot of differences between the two and the husband appears to need a lot of contol and the patient has history of excessive acting out and behavioral discontrol.

## 2015-01-11 ENCOUNTER — Ambulatory Visit (INDEPENDENT_AMBULATORY_CARE_PROVIDER_SITE_OTHER): Payer: BLUE CROSS/BLUE SHIELD | Admitting: Psychiatry

## 2015-01-11 DIAGNOSIS — F411 Generalized anxiety disorder: Secondary | ICD-10-CM

## 2015-01-11 NOTE — Patient Instructions (Signed)
Discussed orally 
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
## 2015-01-31 ENCOUNTER — Encounter (HOSPITAL_COMMUNITY): Payer: Self-pay | Admitting: *Deleted

## 2015-01-31 ENCOUNTER — Emergency Department (HOSPITAL_COMMUNITY)
Admission: EM | Admit: 2015-01-31 | Discharge: 2015-01-31 | Disposition: A | Discharge status: Home / Self Care | Payer: BLUE CROSS/BLUE SHIELD | Attending: Emergency Medicine | Admitting: Emergency Medicine

## 2015-01-31 ENCOUNTER — Emergency Department (HOSPITAL_COMMUNITY): Payer: BLUE CROSS/BLUE SHIELD

## 2015-01-31 DIAGNOSIS — Z872 Personal history of diseases of the skin and subcutaneous tissue: Secondary | ICD-10-CM | POA: Diagnosis not present

## 2015-01-31 DIAGNOSIS — Y9389 Activity, other specified: Secondary | ICD-10-CM | POA: Insufficient documentation

## 2015-01-31 DIAGNOSIS — Y9289 Other specified places as the place of occurrence of the external cause: Secondary | ICD-10-CM | POA: Diagnosis not present

## 2015-01-31 DIAGNOSIS — S99911A Unspecified injury of right ankle, initial encounter: Secondary | ICD-10-CM | POA: Diagnosis present

## 2015-01-31 DIAGNOSIS — S93401A Sprain of unspecified ligament of right ankle, initial encounter: Secondary | ICD-10-CM | POA: Diagnosis not present

## 2015-01-31 DIAGNOSIS — W1789XA Other fall from one level to another, initial encounter: Secondary | ICD-10-CM | POA: Diagnosis not present

## 2015-01-31 DIAGNOSIS — G2581 Restless legs syndrome: Secondary | ICD-10-CM | POA: Insufficient documentation

## 2015-01-31 DIAGNOSIS — E069 Thyroiditis, unspecified: Secondary | ICD-10-CM | POA: Insufficient documentation

## 2015-01-31 DIAGNOSIS — Z79899 Other long term (current) drug therapy: Secondary | ICD-10-CM | POA: Insufficient documentation

## 2015-01-31 DIAGNOSIS — Y998 Other external cause status: Secondary | ICD-10-CM | POA: Insufficient documentation

## 2015-01-31 DIAGNOSIS — Z8742 Personal history of other diseases of the female genital tract: Secondary | ICD-10-CM | POA: Diagnosis not present

## 2015-01-31 DIAGNOSIS — Z8679 Personal history of other diseases of the circulatory system: Secondary | ICD-10-CM | POA: Diagnosis not present

## 2015-01-31 MED ORDER — OXYCODONE-ACETAMINOPHEN 5-325 MG PO TABS
1.0000 | ORAL_TABLET | Freq: Once | ORAL | Status: AC
  Administered 2015-01-31: 1 via ORAL
  Filled 2015-01-31: qty 1

## 2015-01-31 MED ORDER — IBUPROFEN 600 MG PO TABS: 600.0000 mg | ORAL_TABLET | Freq: Four times a day (QID) | ORAL | Status: DC | PRN

## 2015-01-31 MED ORDER — OXYCODONE-ACETAMINOPHEN 5-325 MG PO TABS: 1.0000 | ORAL_TABLET | ORAL | Status: DC | PRN

## 2015-01-31 MED ORDER — IBUPROFEN 800 MG PO TABS
800.0000 mg | ORAL_TABLET | Freq: Once | ORAL | Status: AC
  Administered 2015-01-31: 800 mg via ORAL
  Filled 2015-01-31: qty 1

## 2015-01-31 NOTE — Discharge Instructions (Signed)
Ankle Sprain °An ankle sprain is an injury to the strong, fibrous tissues (ligaments) that hold the bones of your ankle joint together.  °CAUSES °An ankle sprain is usually caused by a fall or by twisting your ankle. Ankle sprains most commonly occur when you step on the outer edge of your foot, and your ankle turns inward. People who participate in sports are more prone to these types of injuries.  °SYMPTOMS  °· Pain in your ankle. The pain may be present at rest or only when you are trying to stand or walk. °· Swelling. °· Bruising. Bruising may develop immediately or within 1 to 2 days after your injury. °· Difficulty standing or walking, particularly when turning corners or changing directions. °DIAGNOSIS  °Your caregiver will ask you details about your injury and perform a physical exam of your ankle to determine if you have an ankle sprain. During the physical exam, your caregiver will press on and apply pressure to specific areas of your foot and ankle. Your caregiver will try to move your ankle in certain ways. An X-ray exam may be done to be sure a bone was not broken or a ligament did not separate from one of the bones in your ankle (avulsion fracture).  °TREATMENT  °Certain types of braces can help stabilize your ankle. Your caregiver can make a recommendation for this. Your caregiver may recommend the use of medicine for pain. If your sprain is severe, your caregiver may refer you to a surgeon who helps to restore function to parts of your skeletal system (orthopedist) or a physical therapist. °HOME CARE INSTRUCTIONS  °· Apply ice to your injury for 1-2 days or as directed by your caregiver. Applying ice helps to reduce inflammation and pain. °¨ Put ice in a plastic bag. °¨ Place a towel between your skin and the bag. °¨ Leave the ice on for 15-20 minutes at a time, every 2 hours while you are awake. °· Only take over-the-counter or prescription medicines for pain, discomfort, or fever as directed by  your caregiver. °· Elevate your injured ankle above the level of your heart as much as possible for 2-3 days. °· If your caregiver recommends crutches, use them as instructed. Gradually put weight on the affected ankle. Continue to use crutches or a cane until you can walk without feeling pain in your ankle. °· If you have a plaster splint, wear the splint as directed by your caregiver. Do not rest it on anything harder than a pillow for the first 24 hours. Do not put weight on it. Do not get it wet. You may take it off to take a shower or bath. °· You may have been given an elastic bandage to wear around your ankle to provide support. If the elastic bandage is too tight (you have numbness or tingling in your foot or your foot becomes cold and blue), adjust the bandage to make it comfortable. °· If you have an air splint, you may blow more air into it or let air out to make it more comfortable. You may take your splint off at night and before taking a shower or bath. Wiggle your toes in the splint several times per day to decrease swelling. °SEEK MEDICAL CARE IF:  °· You have rapidly increasing bruising or swelling. °· Your toes feel extremely cold or you lose feeling in your foot. °· Your pain is not relieved with medicine. °SEEK IMMEDIATE MEDICAL CARE IF: °· Your toes are numb or blue. °·   You have severe pain that is increasing. MAKE SURE YOU:   Understand these instructions.  Will watch your condition.  Will get help right away if you are not doing well or get worse. Document Released: 11/04/2005 Document Revised: 07/29/2012 Document Reviewed: 11/16/2011 [facility] Patient Information 2015 [place], [place]. This information is not intended to replace advice given to you by your health care provider. Make sure you discuss any questions you have with your health care provider.   Wear the ASO and use crutches to avoid weight bearing.  Use ice and elevation as much as possible for the next several days to  help reduce the swelling.  Take the medications prescribed.  You may take the oxycodone prescribed for pain relief.  This will make you drowsy - do not drive within 4 hours of taking this medication.  Use the ibuprofen also for inflammation.  Call your doctor for a recheck of your injury in 1 week.  You may benefit from physical therapy of your ankle if your injury does not progress as expected.

## 2015-01-31 NOTE — ED Notes (Signed)
Pt reporting pain in right ankle after falling.  Swelling noted. Injury aprox 1 hour ago.

## 2015-02-01 NOTE — ED Provider Notes (Signed)
CSN: [id number]     Arrival date & time 01/31/15  2046 History   First MD Initiated Contact with Patient 01/31/15 2120     Chief Complaint  Patient presents with  . Ankle Pain     (Consider location/radiation/quality/duration/timing/severity/associated sxs/prior Treatment) The history is provided by the patient and a relative.   Erica Larson is a 39 y.o. female presenting with right ankle pain which occurred suddenly one hour ago when the patient fell off an inverted bucket that tipped when she attempted to stand on it, inverting the ankle.  Pain is aching, constant and worse with palpation, movement and weight bearing.  The patient was unable to initially bear weight.  There is no radiation of pain and the patient denies numbness distal to the injury site.  The patients treatment prior to arrival included resting the joint.  She has had no medicines for pain.     Past Medical History  Diagnosis Date  . 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 Thyroiditis   . Acne   . Amenorrhea   . Migraine   . Restless legs syndrome    History reviewed. No pertinent past surgical history. Family History  Problem Relation Age of Onset  . Thyroid disease Mother   . Nephrolithiasis Mother   . Urolithiasis Mother   . Heart attack Mother     signs but neg eval  . Nephrolithiasis Father   . Urolithiasis Father   . Anxiety disorder Father   . Drug abuse Father   . Thyroid disease Sister   . Heart disease Maternal Grandmother    History  Substance Use Topics  . Smoking status: Never Smoker   . Smokeless tobacco: Not on file  . Alcohol Use: No   OB History    No data available     Review of Systems  Musculoskeletal: Positive for joint swelling and arthralgias.  Skin: Negative for wound.  Neurological: Negative for weakness and numbness.      Allergies  Review of patient's allergies indicates no known allergies.  Home Medications   Prior to  Admission medications   Medication Sig Start Date End Date Taking? Authorizing Provider  clonazePAM (KLONOPIN) 0.5 MG tablet Take one per day as needed for anxiety 09/27/13   Myrlene Broker, MD  ibuprofen (ADVIL,MOTRIN) 600 MG tablet Take 1 tablet (600 mg total) by mouth every 6 (six) hours as needed. 01/31/15   Burgess Amor, PA-C  levothyroxine (SYNTHROID, LEVOTHROID) 25 MCG tablet  09/10/13   Historical Provider, MD  oxyCODONE-acetaminophen (PERCOCET/ROXICET) 5-325 MG per tablet Take 1 tablet by mouth every 4 (four) hours as needed. 01/31/15   Burgess Amor, PA-C  rOPINIRole (REQUIP) 1 MG tablet Take 1 mg by mouth at bedtime.    Historical Provider, MD  zolpidem (AMBIEN) 5 MG tablet Take 1 tablet (5 mg total) by mouth at bedtime as needed for sleep. 11/05/13   Myrlene Broker, MD   BP 114/72 mmHg  Pulse 73  Temp(Src) 97.8 F (36.6 C) (Oral)  Resp 16  Ht  (1.651 m)  Wt 156 lb (70.761 kg)  BMI 25.96 kg/m2  SpO2 100% Physical Exam  Constitutional: She appears well-developed and well-nourished.  HENT:  Head: Normocephalic.  Cardiovascular: Normal rate and intact distal pulses.  Exam reveals no decreased pulses.   Pulses:      Dorsalis pedis pulses are 2+ on the right side, and 2+ on the left  side.       Posterior tibial pulses are 2+ on the right side, and 2+ on the left side.  Musculoskeletal: She exhibits edema and tenderness.       Right ankle: She exhibits decreased range of motion, swelling and ecchymosis. She exhibits normal pulse. Tenderness. Lateral malleolus tenderness found. No head of 5th metatarsal and no proximal fibula tenderness found. Achilles tendon normal.  Neurological: She is alert. No sensory deficit.  Skin: Skin is warm, dry and intact.  Nursing note and vitals reviewed.   ED Course  Procedures (including critical care time) Labs Review Labs Reviewed - No data to display  Imaging Review Dg Ankle Complete Right  01/31/2015   CLINICAL DATA:  Pain after falling   EXAM: RIGHT ANKLE - COMPLETE 3+ VIEW  FINDINGS: There is marked lateral malleolar soft tissue swelling. No fracture is evident. The mortise is symmetric. There is no radiopaque foreign body.  IMPRESSION: Negative for acute fracture   Electronically Signed   By: Ellery Plunk M.D.   On: 01/31/2015 21:38     EKG Interpretation None      MDM   Final diagnoses:  Ankle sprain, right, initial encounter    RICE, aso, crutches. Ibuprofen, oxycodone.  F/u with pcp in 1 week for recheck.  Pt with significant edema, may benefit from PT once swelling improved.    Patients labs and/or radiological studies were reviewed and considered during the medical decision making and disposition process.  Results were also discussed with patient.     Burgess Amor, PA-C 02/01/15 1408  Bethann Berkshire, MD 02/01/15 [phone]

## 2015-03-14 ENCOUNTER — Ambulatory Visit (INDEPENDENT_AMBULATORY_CARE_PROVIDER_SITE_OTHER): Payer: BLUE CROSS/BLUE SHIELD | Admitting: Psychiatry

## 2015-03-14 DIAGNOSIS — F329 Major depressive disorder, single episode, unspecified: Secondary | ICD-10-CM | POA: Diagnosis not present

## 2015-03-14 DIAGNOSIS — F32A Depression, unspecified: Secondary | ICD-10-CM

## 2015-03-14 NOTE — Patient Instructions (Signed)
Discussed orally 
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
## 2015-03-15 ENCOUNTER — Encounter (HOSPITAL_COMMUNITY): Payer: Self-pay | Admitting: Psychiatry

## 2015-03-15 ENCOUNTER — Ambulatory Visit (INDEPENDENT_AMBULATORY_CARE_PROVIDER_SITE_OTHER): Payer: BLUE CROSS/BLUE SHIELD | Admitting: Psychiatry

## 2015-03-15 VITALS — BP 111/68 | HR 72 | Ht 65.0 in | Wt 162.2 lb

## 2015-03-15 DIAGNOSIS — F329 Major depressive disorder, single episode, unspecified: Secondary | ICD-10-CM

## 2015-03-15 DIAGNOSIS — F410 Panic disorder [episodic paroxysmal anxiety] without agoraphobia: Secondary | ICD-10-CM | POA: Diagnosis not present

## 2015-03-15 DIAGNOSIS — F32A Depression, unspecified: Secondary | ICD-10-CM

## 2015-03-15 MED ORDER — ESCITALOPRAM OXALATE 20 MG PO TABS: 20.0000 mg | ORAL_TABLET | Freq: Every day | ORAL | Status: DC
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
## 2015-04-03 ENCOUNTER — Ambulatory Visit (INDEPENDENT_AMBULATORY_CARE_PROVIDER_SITE_OTHER): Payer: BLUE CROSS/BLUE SHIELD | Admitting: Psychiatry

## 2015-04-03 DIAGNOSIS — F329 Major depressive disorder, single episode, unspecified: Secondary | ICD-10-CM | POA: Diagnosis not present

## 2015-04-03 DIAGNOSIS — F32A Depression, unspecified: Secondary | ICD-10-CM
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
## 2015-04-03 NOTE — Patient Instructions (Signed)
Discussed orally 

## 2015-04-25 ENCOUNTER — Ambulatory Visit (INDEPENDENT_AMBULATORY_CARE_PROVIDER_SITE_OTHER): Payer: BLUE CROSS/BLUE SHIELD | Admitting: Psychiatry

## 2015-04-25 DIAGNOSIS — F32A Depression, unspecified: Secondary | ICD-10-CM

## 2015-04-25 DIAGNOSIS — F329 Major depressive disorder, single episode, unspecified: Secondary | ICD-10-CM | POA: Diagnosis not present
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
## 2015-04-25 NOTE — Patient Instructions (Signed)
Discussed orally 

## 2015-05-10 ENCOUNTER — Ambulatory Visit (HOSPITAL_COMMUNITY): Payer: Self-pay | Admitting: Psychiatry

## 2015-05-12 ENCOUNTER — Ambulatory Visit (INDEPENDENT_AMBULATORY_CARE_PROVIDER_SITE_OTHER): Payer: BLUE CROSS/BLUE SHIELD | Admitting: Psychiatry

## 2015-05-12 DIAGNOSIS — F329 Major depressive disorder, single episode, unspecified: Secondary | ICD-10-CM | POA: Diagnosis not present

## 2015-05-12 DIAGNOSIS — F32A Depression, unspecified: Secondary | ICD-10-CM

## 2015-05-12 NOTE — Patient Instructions (Signed)
Discussed orally 
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
## 2015-06-13 ENCOUNTER — Ambulatory Visit (INDEPENDENT_AMBULATORY_CARE_PROVIDER_SITE_OTHER): Payer: BLUE CROSS/BLUE SHIELD | Admitting: Psychiatry

## 2015-06-13 DIAGNOSIS — F329 Major depressive disorder, single episode, unspecified: Secondary | ICD-10-CM | POA: Diagnosis not present

## 2015-06-13 DIAGNOSIS — F411 Generalized anxiety disorder: Secondary | ICD-10-CM | POA: Diagnosis not present

## 2015-06-13 DIAGNOSIS — F32A Depression, unspecified: Secondary | ICD-10-CM

## 2015-06-13 NOTE — Patient Instructions (Signed)
Discussed orally 
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
## 2015-07-04 ENCOUNTER — Telehealth (HOSPITAL_COMMUNITY): Payer: Self-pay | Admitting: *Deleted

## 2015-07-04 ENCOUNTER — Ambulatory Visit (INDEPENDENT_AMBULATORY_CARE_PROVIDER_SITE_OTHER): Payer: BLUE CROSS/BLUE SHIELD | Admitting: Psychiatry

## 2015-07-04 DIAGNOSIS — F411 Generalized anxiety disorder: Secondary | ICD-10-CM

## 2015-07-04 DIAGNOSIS — F329 Major depressive disorder, single episode, unspecified: Secondary | ICD-10-CM

## 2015-07-04 DIAGNOSIS — F32A Depression, unspecified: Secondary | ICD-10-CM

## 2015-07-04 NOTE — Telephone Encounter (Signed)
Lmtcb. Number provided 

## 2015-07-04 NOTE — Telephone Encounter (Signed)
Pt called stating her Lexapro is not working. Per pt, the Lexapro is interfering with her sexual life and would like to know if Dr. Tenny Craw had another medication she could recommend. Pt number is [phone]

## 2015-07-04 NOTE — Telephone Encounter (Signed)
Not seen since April-will need to come in

## 2015-07-04 NOTE — Patient Instructions (Signed)
Discussed orally 
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
## 2015-07-05 ENCOUNTER — Ambulatory Visit (INDEPENDENT_AMBULATORY_CARE_PROVIDER_SITE_OTHER): Payer: BLUE CROSS/BLUE SHIELD | Admitting: Psychiatry

## 2015-07-05 ENCOUNTER — Encounter (HOSPITAL_COMMUNITY): Payer: Self-pay | Admitting: Psychiatry

## 2015-07-05 DIAGNOSIS — F410 Panic disorder [episodic paroxysmal anxiety] without agoraphobia: Secondary | ICD-10-CM

## 2015-07-05 DIAGNOSIS — F329 Major depressive disorder, single episode, unspecified: Secondary | ICD-10-CM

## 2015-07-05 DIAGNOSIS — F32A Depression, unspecified: Secondary | ICD-10-CM

## 2015-07-05 MED ORDER — ESCITALOPRAM OXALATE 20 MG PO TABS: 20.0000 mg | ORAL_TABLET | Freq: Every day | ORAL | Status: DC

## 2015-07-05 MED ORDER — CLONAZEPAM 0.5 MG PO TABS: ORAL_TABLET | ORAL | Status: DC

## 2015-07-05 MED ORDER — BUPROPION HCL 100 MG PO TABS: 100.0000 mg | ORAL_TABLET | Freq: Every day | ORAL | Status: DC
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
## 2015-07-06 NOTE — Telephone Encounter (Signed)
Pt is aware and came into office for her appt.../or

## 2015-08-03 ENCOUNTER — Ambulatory Visit (INDEPENDENT_AMBULATORY_CARE_PROVIDER_SITE_OTHER): Payer: BLUE CROSS/BLUE SHIELD | Admitting: Psychiatry

## 2015-08-03 ENCOUNTER — Encounter (HOSPITAL_COMMUNITY): Payer: Self-pay | Admitting: Psychiatry

## 2015-08-03 DIAGNOSIS — F411 Generalized anxiety disorder: Secondary | ICD-10-CM

## 2015-08-03 DIAGNOSIS — F32A Depression, unspecified: Secondary | ICD-10-CM

## 2015-08-03 DIAGNOSIS — F329 Major depressive disorder, single episode, unspecified: Secondary | ICD-10-CM

## 2015-08-03 NOTE — Patient Instructions (Signed)
Discussed orrally 

## 2015-08-03 NOTE — Progress Notes (Signed)
       THERAPIST PROGRESS NOTE  Session Time: Thursday 08/03/2015 3:10 PM 3:55 PM  Participation Level: Active  Behavioral Response: CasualAlert/less anxious  Type of Therapy: Individual Therapy  Treatment Goals addressed:  Increase self acceptance  Improve assertiveness skills and ability to set and maintain boundaries  Improve mood and decrease anxiety response    Interventions: Supportive  Summary: Erica Larson is a 39 y.o. female who presents with a history of anxiety and anger outbursts that have been intermittent for several years. Patient reports often feeling enraged and out of control. She reports significant marital stress and trust issues in her marriage.   Patient reports doing well and feeling better about self since last session. She reports states medication ,Welbutrin, recently prescribed by psychiatrist Dr. Tenny Craw  has been very helpful by helping her slow down and not be so impulsive. She is excited about  an upcoming job interview. She has maintained social involvement and contact with extended family. She reports decreased marital stress and improvement in marriage until this past Monday. She statess finding a text and learning information that has caused her to have suspicions that husband may have cheated on her earlier this year. She also reports husband has become more distant and withdrawn in the last few days. She expresses worry about whether or not to investigate the situation.   Suicidal/Homicidal: No  Therapist Response: Therapist works with patient to identify and verbalize feelings, works with patient to do cost benefit analysis of investigating and not investigating her concerns about marriage, encourage patient to continue positive self-care  Plan: Return again in 4 weeks.  Diagnosis: Axis I: Anxiety Disorder NOS and Mood Disorder NOS    Axis II: Deferred    [name], LCSW 08/03/2015

## 2015-08-04 ENCOUNTER — Ambulatory Visit (HOSPITAL_COMMUNITY): Payer: Self-pay | Admitting: Psychiatry

## 2015-08-21 ENCOUNTER — Ambulatory Visit (HOSPITAL_COMMUNITY): Payer: Self-pay | Admitting: Psychiatry

## 2015-08-21 ENCOUNTER — Telehealth (HOSPITAL_COMMUNITY): Payer: Self-pay | Admitting: *Deleted

## 2015-08-21 ENCOUNTER — Telehealth (HOSPITAL_COMMUNITY): Payer: Self-pay | Admitting: Psychiatry
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
## 2015-08-27 ENCOUNTER — Inpatient Hospital Stay (HOSPITAL_COMMUNITY)
Admission: AD | Admit: 2015-08-27 | Discharge: 2015-08-27 | Disposition: A | Discharge status: Home / Self Care | Payer: BLUE CROSS/BLUE SHIELD | Source: Ambulatory Visit | Attending: Obstetrics and Gynecology | Admitting: Obstetrics and Gynecology

## 2015-08-27 ENCOUNTER — Encounter (HOSPITAL_COMMUNITY): Payer: Self-pay | Admitting: *Deleted

## 2015-08-27 DIAGNOSIS — R1032 Left lower quadrant pain: Secondary | ICD-10-CM | POA: Diagnosis not present

## 2015-08-27 DIAGNOSIS — N939 Abnormal uterine and vaginal bleeding, unspecified: Secondary | ICD-10-CM | POA: Diagnosis present

## 2015-08-27 DIAGNOSIS — Z3202 Encounter for pregnancy test, result negative: Secondary | ICD-10-CM | POA: Diagnosis not present

## 2015-08-27 LAB — CBC
HCT: 38.2 % (ref 36.0–46.0)
HEMOGLOBIN: 12.7 g/dL (ref 12.0–15.0)
MCH: 29.5 pg (ref 26.0–34.0)
MCHC: 33.2 g/dL (ref 30.0–36.0)
MCV: 88.8 fL (ref 78.0–100.0)
Platelets: 336 10*3/uL (ref 150–400)
RBC: 4.3 MIL/uL (ref 3.87–5.11)
RDW: 14.4 % (ref 11.5–15.5)
WBC: 8.7 10*3/uL (ref 4.0–10.5)

## 2015-08-27 LAB — POCT PREGNANCY, URINE: Preg Test, Ur: NEGATIVE

## 2015-08-27 NOTE — Discharge Instructions (Signed)

## 2015-08-27 NOTE — MAU Provider Note (Signed)
History     CSN: [id number]  Arrival date and time: 08/27/15 1126   None     Chief Complaint  Patient presents with  . Vaginal Bleeding  . Fatigue   HPI 39 y.o. [id number] presenting w/ vaginal bleeding ongoing x 2 months. Pt states she had IUD placed 2 months ago for PCOS, bleeding started after IUD placement, IUD was removed Tuesday in office and Nuva Ring was placed, pt removed Nuva Ring 2 days ago. Hgb was 12.0 at that time. Pt states she had not had a period for 2-3 months prior to IUD insertion and prior to that it had been "several months" since last period". She reports for the past 2 months she has been bleeding intermittently, sometimes no bleeding or light bleeding, sometimes heavy, soaking through a tampon and pad in less than 2 hours. Pt reports ongoing LLQ pain that has been present "for a long time" and has not changed. Pt also c/o fatigue, shakiness and shortness of breath - pt states she feels short of breath just laying in bed and speaking with [place], however her breathing appears unlabored and she is speaking w/o difficulty. Pt denies h/o anxiety, although she was seen at behavioral health 2 months ago specifically for anxiety.   Past Medical History  Diagnosis Date  . 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 Thyroiditis   . Acne   . Amenorrhea   . Migraine   . Restless legs syndrome     Past Surgical History  Procedure Laterality Date  . Dilation and curettage of uterus      Family History  Problem Relation Age of Onset  . Thyroid disease Mother   . Nephrolithiasis Mother   . Urolithiasis Mother   . Heart attack Mother     signs but neg eval  . Nephrolithiasis Father   . Urolithiasis Father   . Anxiety disorder Father   . Drug abuse Father   . Thyroid disease Sister   . Heart disease Maternal Grandmother     Social History  Substance Use Topics  . Smoking status: Never Smoker   . Smokeless tobacco: None  . Alcohol Use: No     Allergies: No Known Allergies  No prescriptions prior to admission    Review of Systems  Constitutional: Negative.   Respiratory: Negative.   Cardiovascular: Negative.   Gastrointestinal: Positive for abdominal pain (LLQ ). Negative for nausea, vomiting, diarrhea and constipation.  Genitourinary: Negative for dysuria, urgency, frequency, hematuria and flank pain.       + bleeding   Musculoskeletal: Negative.   Neurological: Negative.   Psychiatric/Behavioral: Negative.    Physical Exam   Blood pressure 119/79, pulse 95, temperature 98.1 F (36.7 C), temperature source Oral, resp. rate 18, height 5\' 6"  (1.676 m), weight 163 lb (73.936 kg), last menstrual period 03/17/2015, SpO2 100 %.  Physical Exam  Nursing note and vitals reviewed. Constitutional: She is oriented to person, place, and time. She appears well-developed and well-nourished. No distress.  HENT:  Head: Normocephalic and atraumatic.  Cardiovascular: Normal rate, regular rhythm and normal heart sounds.   Respiratory: Effort normal and breath sounds normal. No respiratory distress. She has no wheezes.  GI: Soft. Bowel sounds are normal. She exhibits no distension and no mass. There is no tenderness. There is no rebound and no guarding.  Genitourinary: There is no rash or lesion on the right labia. There is no rash or lesion  on the left labia. Uterus is not deviated, not enlarged, not fixed and not tender. Cervix exhibits no motion tenderness, no discharge and no friability. Right adnexum displays no mass, no tenderness and no fullness. Left adnexum displays no mass, no tenderness and no fullness. There is bleeding (moderate) in the vagina. No erythema or tenderness in the vagina. No vaginal discharge found.  Neurological: She is alert and oriented to person, place, and time.  Skin: Skin is warm and dry.  Psychiatric: She has a normal mood and affect.    MAU Course  Procedures Results for orders placed or performed  during the hospital encounter of 08/27/15 (from the past 24 hour(s))  CBC     Status: None   Collection Time: 08/27/15 11:29 AM  Result Value Ref Range   WBC 8.7 4.0 - 10.5 K/uL   RBC 4.30 3.87 - 5.11 MIL/uL   Hemoglobin 12.7 12.0 - 15.0 g/dL   HCT 14.7 82.9 - 56.2 %   MCV 88.8 78.0 - 100.0 fL   MCH 29.5 26.0 - 34.0 pg   MCHC 33.2 30.0 - 36.0 g/dL   RDW 13.0 86.5 - 78.4 %   Platelets 336 150 - 400 K/uL  Pregnancy, urine POC     Status: None   Collection Time: 08/27/15 11:41 AM  Result Value Ref Range   Preg Test, Ur NEGATIVE NEGATIVE     Assessment and Plan   1. Abnormal vaginal bleeding   Pt w/ moderate bleeding today, Hgb WNL. Pt to call office tomorrow to arrange for follow up per Dr. Arelia Sneddon. No further intervention needed today.     Medication List    TAKE these medications        buPROPion 100 MG tablet  Commonly known as:  WELLBUTRIN  Take 1 tablet (100 mg total) by mouth daily with breakfast.     clonazePAM 0.5 MG tablet  Commonly known as:  KLONOPIN  Take one per day as needed for anxiety     escitalopram 20 MG tablet  Commonly known as:  LEXAPRO  Take 1 tablet (20 mg total) by mouth daily.     ibuprofen 600 MG tablet  Commonly known as:  ADVIL,MOTRIN  Take 1 tablet (600 mg total) by mouth every 6 (six) hours as needed.     levothyroxine 25 MCG tablet  Commonly known as:  SYNTHROID, LEVOTHROID  Take 25 mcg by mouth.     rOPINIRole 1 MG tablet  Commonly known as:  REQUIP  Take 1 mg by mouth at bedtime.     zolpidem 5 MG tablet  Commonly known as:  AMBIEN  Take 1 tablet (5 mg total) by mouth at bedtime as needed for sleep.            Follow-up Information    Schedule an appointment as soon as possible for a visit with [facility] Cristie Hem, MD.   Specialty:  Obstetrics and Gynecology   Contact information:   [address] [place] [address] [phone]         Celina Shiley 08/27/2015, 2:15 PM

## 2015-08-27 NOTE — MAU Note (Addendum)
Patient presents stating she is not pregnant with c/o heavy vaginal bleeding and fatigue following placement of an IUD 2 months ago for polycystic ovaries. At her one month checkup following placement, she requested that the IUD be removed but was told to give it another month because her body needs to adjust to the IUD. IUD was removed Tuesday and a neuvo-ring was placed at that time. HGB level that day was 12. States she has left-sided pain and bleeding has continued. States she is passing large clots. Removed neuvo-ring Friday.

## 2015-08-31 ENCOUNTER — Ambulatory Visit (INDEPENDENT_AMBULATORY_CARE_PROVIDER_SITE_OTHER): Payer: BLUE CROSS/BLUE SHIELD | Admitting: Psychiatry

## 2015-08-31 ENCOUNTER — Encounter (HOSPITAL_COMMUNITY): Payer: Self-pay | Admitting: Psychiatry

## 2015-08-31 DIAGNOSIS — F329 Major depressive disorder, single episode, unspecified: Secondary | ICD-10-CM

## 2015-08-31 DIAGNOSIS — F411 Generalized anxiety disorder: Secondary | ICD-10-CM | POA: Diagnosis not present

## 2015-08-31 DIAGNOSIS — F32A Depression, unspecified: Secondary | ICD-10-CM

## 2015-08-31 NOTE — Patient Instructions (Signed)
Discussed orally 

## 2015-08-31 NOTE — Progress Notes (Signed)
       THERAPIST PROGRESS NOTE  Session Time: Thursday   08/31/2015 1:15 PM -1:53 PM  Participation Level: Active  Behavioral Response: CasualAlert/less anxious  Type of Therapy: Individual Therapy  Treatment Goals addressed:  Increase self acceptance  Improve assertiveness skills and ability to set and maintain boundaries  Improve mood and decrease anxiety response    Interventions: Supportive  Summary: Erica Larson is a 39 y.o. female who presents with a history of anxiety and anger outbursts that have been intermittent for several years. Patient reports often feeling enraged and out of control. She reports significant marital stress and trust issues in her marriage.   Patient reports increased stress and states mood has been up and down since last session. She left home for 3 days after conflict with her her husband who told her to leave. She has returned home but reports continued conflict in the marriage. Husband continues to fail to take responsibility for his actions and blames patient for their problems. She did confront husband about her suspicions regarding his possible cheating and says he reassured her he is not cheating. Patient still is contemplating whether or not she wants to remain in the marriage as husband is repeating his pattern per her report.  Patient is disappointed some of her relatives are not as supportive as they once were but reports continued strong support from one of her uncles. Patient also has increased health issues due to uterine bleeding and had to go to ER this past Sunday.   Suicidal/Homicidal: No  Therapist Response: Therapist works with patient to review symptoms, identify and verbalize feelings, discuss prioritizing her health, discuss possibility of patient obtaining legal consultation regarding her options, encourage patient to maintain positive self-care  Plan: Return again in 4 weeks.  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

## 2015-09-01 ENCOUNTER — Ambulatory Visit (INDEPENDENT_AMBULATORY_CARE_PROVIDER_SITE_OTHER): Payer: BLUE CROSS/BLUE SHIELD | Admitting: Psychiatry

## 2015-09-01 ENCOUNTER — Encounter (HOSPITAL_COMMUNITY): Payer: Self-pay | Admitting: Psychiatry

## 2015-09-01 VITALS — BP 117/72 | HR 75 | Ht 66.0 in | Wt 173.0 lb

## 2015-09-01 DIAGNOSIS — F410 Panic disorder [episodic paroxysmal anxiety] without agoraphobia: Secondary | ICD-10-CM | POA: Diagnosis not present

## 2015-09-01 DIAGNOSIS — F411 Generalized anxiety disorder: Secondary | ICD-10-CM

## 2015-09-01 MED ORDER — BUPROPION HCL ER (XL) 150 MG PO TB24: 150.0000 mg | ORAL_TABLET | ORAL | Status: DC

## 2015-09-01 MED ORDER — CLONAZEPAM 0.5 MG PO TABS: ORAL_TABLET | ORAL | Status: DC

## 2015-09-01 NOTE — Progress Notes (Signed)
Patient ID: ERMAL LOM, female   DOB: 06-Jun-1976, 39 y.o.   MRN: [id number] Patient ID: EMILYANNE PRIMAS, female   DOB: Nov 03, 1976, 39 y.o.   MRN: [id number] Patient ID: RENESME FILBECK, female   DOB: [birth year], 39 y.o.   MRN: [id number] Patient ID: ZAILA SHANKMAN, female   DOB: 1976/01/16, 39 y.o.   MRN: [id number]  Psychiatric Assessment Adult  Patient Identification:  Erica Larson Date of Evaluation:  09/01/2015 Chief Complaint: "I left my husband for 3 days." History of Chief Complaint:   Chief Complaint  Patient presents with  . Depression  . Anxiety  . Follow-up    Depression        Past medical history includes anxiety.   Anxiety Symptoms include nervous/anxious behavior and shortness of breath.     this patient is a 39 year old married white female lives with her husband and 23 year old daughter in [place]. She owns a travel agency,   The patient is self-referred. About 10 days ago she was in her car and went into a rage on the phone with her husband. The next day he had a big argument with her mother. She felt like she couldn't calm down and anytime a door open she was startle and her heart rate was accelerated. She [facility] and eventually to the ER. Apparently she was up to a Holter monitor and everything looked normal but she didn't want to wait for further monitoring and left after an hour.  The patient states that she's never had any psychiatric treatment or psychological counseling. She has been under a fair amount of stress. She and her husband have had a bit of a rocky time in a relationship. They don't really trust each other. She and her mother don't get along either and she states her mother is very controlling. 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 She states this is been like this since she was a child. She feels like she's finally set up another breaking point with her mom.  She is  calming down now but still has some panicky nervous feelings. She's not significantly depressed. She's been exercising more and her sleep is fairly good. She states her mother was always antagonistic and her father used drugs and alcohol and she grew out in a home where the parents fought. Her mother abruptly walked out when she was 14. She states she and her father were very close although he was still using drugs and alcohol. A 16 she moved out with a boyfriend. She eventually reconciled relationships with both parents. Her father died suddenly at 7 years ago when he was working in [place].f  The patient returns after 2 months. About 2 weeks ago she and her husband got in another bad argument. He wanted her to sign some sort of financial agreement and she refused. She ended up leaving for 3 days. Since she's been back he's been rather withdrawn and distant. She also had to go the emergency room with abnormal vaginal bleeding. She's had her IUD removed and a new Nuvoring put in and she took that out as well. She still doesn't like the Lexapro because of the sexual problems but the Wellbutrin seems to have helped her mood a bit so we can go ahead and increase it and stop the Lexapro. The clonazepam helps her anxiety and she continues in her therapy here. She still not  sure why she continues to stay with her husband Review of Systems  Respiratory: Positive for chest tightness and shortness of breath.   Psychiatric/Behavioral: Positive for depression. The patient is nervous/anxious.    Physical Exam not done  Depressive Symptoms: anxiety, panic attacks,  (Hypo) Manic Symptoms:   Elevated Mood:  No Irritable Mood:  Yes Grandiosity:  No Distractibility:  No Labiality of Mood:  Yes Delusions:  No Hallucinations:  No Impulsivity:  No Sexually Inappropriate Behavior:  No Financial Extravagance:  No Flight of Ideas:  No  Anxiety Symptoms: Excessive Worry:  Yes Panic Symptoms:  Yes Agoraphobia:   No Obsessive Compulsive: No  Symptoms: None, Specific Phobias:  No Social Anxiety:  No  Psychotic Symptoms:  Hallucinations: No None Delusions:  No Paranoia:  No   Ideas of Reference:  No  PTSD Symptoms: Ever had a traumatic exposure:  No Had a traumatic exposure in the last month:  No Re-experiencing: No None Hypervigilance:  No Hyperarousal: Yes Irritability/Anger Avoidance: No None  Traumatic Brain Injury: No  Past Psychiatric History: Diagnosis: None   Hospitalizations: None   Outpatient Care: None   Substance Abuse Care: None   Self-Mutilation: None   Suicidal Attempts: None   Violent Behaviors: None    Past Medical History:   Past Medical History  Diagnosis Date  . 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 Thyroiditis   . Acne   . Amenorrhea   . Migraine   . Restless legs syndrome    History of Loss of Consciousness:  No Seizure History:  No Cardiac History:  No Allergies:  No Known Allergies Current Medications:  Current Outpatient Prescriptions  Medication Sig Dispense Refill  . clonazePAM (KLONOPIN) 0.5 MG tablet Take one per day as needed for anxiety 60 tablet 2  . ibuprofen (ADVIL,MOTRIN) 600 MG tablet Take 1 tablet (600 mg total) by mouth every 6 (six) hours as needed. 30 tablet 0  . levothyroxine (SYNTHROID, LEVOTHROID) 25 MCG tablet Take 25 mcg by mouth.     Marland Kitchen rOPINIRole (REQUIP) 1 MG tablet Take 1 mg by mouth at bedtime.    Marland Kitchen buPROPion (WELLBUTRIN XL) 150 MG 24 hr tablet Take 1 tablet (150 mg total) by mouth every morning. 30 tablet 2  . zolpidem (AMBIEN) 5 MG tablet Take 1 tablet (5 mg total) by mouth at bedtime as needed for sleep. (Patient not taking: Reported on 03/15/2015) 30 tablet 2   No current facility-administered medications for this visit.    Previous Psychotropic Medications:  Medication Dose                         Substance Abuse History in the last 12 months: Substance Age of 1st Use Last  Use Amount Specific Type  Nicotine      Alcohol      Cannabis      Opiates      Cocaine      Methamphetamines      LSD      Ecstasy      Benzodiazepines      Caffeine      Inhalants      Others:                          Medical Consequences of Substance Abuse: n/a  Legal Consequences of Substance Abuse: n/a  Family Consequences of Substance Abuse: n/a  Blackouts:  No DT's:  No Withdrawal Symptoms:  No None  Social History: Current Place of Residence: [place] of Birth: Same Family Members: Husband, 25 year old daughter Marital Status:  Married Children:   Sons:   Daughters: 1 Relationships:  Education:  Corporate treasurer Problems/Performance:  Religious Beliefs/Practices: Christian History of Abuse: none Teacher, music History:  None. Legal History: None Hobbies/Interests: Spending time with daughter  Family History:   Family History  Problem Relation Age of Onset  . Thyroid disease Mother   . Nephrolithiasis Mother   . Urolithiasis Mother   . Heart attack Mother     signs but neg eval  . Nephrolithiasis Father   . Urolithiasis Father   . Anxiety disorder Father   . Drug abuse Father   . Thyroid disease Sister   . Heart disease Maternal Grandmother     Mental Status Examination/Evaluation: Objective:  Appearance: Neat and Well Groomed  Eye Contact::  Good  Speech:  Clear and Coherent  Mood: Fairly good     Affect:  Bright  Thought Process:  Negative  Orientation:  Full (Time, Place, and Person)  Thought Content:  Negative  Suicidal Thoughts:no  Homicidal Thoughts:  No  Judgement:  Good  Insight:  Fair  Psychomotor Activity:  Normal  Akathisia:  No  Handed:  Right  AIMS (if indicated):    Assets:  Communication Skills Desire for Improvement Social Support    Laboratory/X-Ray Psychological Evaluation(s)        Assessment:  Axis I: Panic Disorder  AXIS I Panic Disorder  AXIS II Deferred  AXIS  III Past Medical History  Diagnosis Date  . 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 Thyroiditis   . Acne   . Amenorrhea   . Migraine   . Restless legs syndrome      AXIS IV other psychosocial or environmental problems  AXIS V 61-70 mild symptoms   Treatment Plan/Recommendations:  Plan of Care: Medication management   Laboratory:    Psychotherapy: She is seeing Peggy Bynum   Medications: She'll taper off Lexapro 20 mg daily for depression and change to Wellbutrin XL 150 mg every morning for depression and continue clonazepam 0.5 mg daily at bedtime for sleep and anxiety   Routine PRN Medications:  No  Consultations:  Safety Concerns:  She denies thoughts of harm to self or others   Other: She will return in 6 weeks    Diannia Ruder, MD 10/14/201611:46 AM

## 2015-10-02 ENCOUNTER — Ambulatory Visit (INDEPENDENT_AMBULATORY_CARE_PROVIDER_SITE_OTHER): Payer: BLUE CROSS/BLUE SHIELD | Admitting: Psychiatry

## 2015-10-02 ENCOUNTER — Encounter (HOSPITAL_COMMUNITY): Payer: Self-pay | Admitting: Psychiatry

## 2015-10-02 VITALS — BP 103/58 | HR 76 | Ht 66.0 in | Wt 174.2 lb

## 2015-10-02 DIAGNOSIS — F410 Panic disorder [episodic paroxysmal anxiety] without agoraphobia: Secondary | ICD-10-CM

## 2015-10-02 DIAGNOSIS — F411 Generalized anxiety disorder: Secondary | ICD-10-CM | POA: Diagnosis not present

## 2015-10-02 MED ORDER — BUPROPION HCL 100 MG PO TABS: 100.0000 mg | ORAL_TABLET | Freq: Every day | ORAL | Status: DC

## 2015-10-02 MED ORDER — ESCITALOPRAM OXALATE 20 MG PO TABS: 20.0000 mg | ORAL_TABLET | Freq: Every day | ORAL | Status: DC

## 2015-10-02 NOTE — Progress Notes (Signed)
Patient ID: Erica Larson, female   DOB: October 21, 1976, 39 y.o.   MRN: [id number] Patient ID: Erica Larson, female   DOB: May 09, 1976, 39 y.o.   MRN: [id number] Patient ID: Erica Larson, female   DOB: 1976/02/15, 39 y.o.   MRN: [id number] Patient ID: Erica Larson, female   DOB: May 09, 1976, 39 y.o.   MRN: [id number] Patient ID: Erica Larson, female   DOB: 02/04/1976, 39 y.o.   MRN: [id number]  Psychiatric Assessment Adult  Patient Identification:  Erica Larson Date of Evaluation:  10/02/2015 Chief Complaint: "I'm irritable without the Lexapro." History of Chief Complaint:   Chief Complaint  Patient presents with  . Depression  . Anxiety  . Follow-up    Depression        Past medical history includes anxiety.   Anxiety Symptoms include nervous/anxious behavior and shortness of breath.     this patient is a 39 year old married white female lives with her husband and 59 year old daughter in [place]. She owns a travel agency,   The patient is self-referred. About 10 days ago she was in her car and went into a rage on the phone with her husband. The next day he had a big argument with her mother. She felt like she couldn't calm down and anytime a door open she was startle and her heart rate was accelerated. [facility] and eventually to the ER. Apparently she was up to a Holter monitor and everything looked normal but she didn't want to wait for further monitoring and left after an hour.  The patient states that she's never had any psychiatric treatment or psychological counseling. She has been under a fair amount of stress. She and her husband have had a bit of a rocky time in a relationship. They don't really trust each other. She and her mother don't get along either and she states her mother is very controlling. 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 She states this is been like this since she was a child.  She feels like she's finally set up another breaking point with her mom.  She is calming down now but still has some panicky nervous feelings. She's not significantly depressed. She's been exercising more and her sleep is fairly good. She states her mother was always antagonistic and her father used drugs and alcohol and she grew out in a home where the parents fought. Her mother abruptly walked out when she was 14. She states she and her father were very close although he was still using drugs and alcohol. A 16 she moved out with a boyfriend. She eventually reconciled relationships with both parents. Her father died suddenly at 7 years ago when he was working in [place].f  The patient returns after 2 months. Last time we stopped the Lexapro and started an increased dose of Wellbutrin at 150 mg. Her sexual problems of stop but now she is more irritable and angry. She thinks she did better on the 100 mg dose of Wellbutrin. She feels more edgy and angry without the Lexapro. She and her husband continue to have difficulties and she thinks she is going to leave fairly soon so she's thinks she may be just as well go back on the Lexapro. At times she has trouble sleeping but she does have both clonazepam and Ambien at home if she needs him but she rarely takes either one.  Review of Systems  Respiratory: Positive for chest tightness and shortness of breath.   Psychiatric/Behavioral: Positive for depression. The patient is nervous/anxious.    Physical Exam not done  Depressive Symptoms: anxiety, panic attacks,  (Hypo) Manic Symptoms:   Elevated Mood:  No Irritable Mood:  Yes Grandiosity:  No Distractibility:  No Labiality of Mood:  Yes Delusions:  No Hallucinations:  No Impulsivity:  No Sexually Inappropriate Behavior:  No Financial Extravagance:  No Flight of Ideas:  No  Anxiety Symptoms: Excessive Worry:  Yes Panic Symptoms:  Yes Agoraphobia:  No Obsessive Compulsive: No  Symptoms:  None, Specific Phobias:  No Social Anxiety:  No  Psychotic Symptoms:  Hallucinations: No None Delusions:  No Paranoia:  No   Ideas of Reference:  No  PTSD Symptoms: Ever had a traumatic exposure:  No Had a traumatic exposure in the last month:  No Re-experiencing: No None Hypervigilance:  No Hyperarousal: Yes Irritability/Anger Avoidance: No None  Traumatic Brain Injury: No  Past Psychiatric History: Diagnosis: None   Hospitalizations: None   Outpatient Care: None   Substance Abuse Care: None   Self-Mutilation: None   Suicidal Attempts: None   Violent Behaviors: None    Past Medical History:   Past Medical History  Diagnosis Date  . 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 Thyroiditis   . Acne   . Amenorrhea   . Migraine   . Restless legs syndrome    History of Loss of Consciousness:  No Seizure History:  No Cardiac History:  No Allergies:  No Known Allergies Current Medications:  Current Outpatient Prescriptions  Medication Sig Dispense Refill  . clonazePAM (KLONOPIN) 0.5 MG tablet Take one per day as needed for anxiety 60 tablet 2  . ibuprofen (ADVIL,MOTRIN) 600 MG tablet Take 1 tablet (600 mg total) by mouth every 6 (six) hours as needed. 30 tablet 0  . levothyroxine (SYNTHROID, LEVOTHROID) 25 MCG tablet Take 50 mcg by mouth.     Marland Kitchen PHENTERMINE HCL PO Take 18.75 mg by mouth daily.    Marland Kitchen rOPINIRole (REQUIP) 1 MG tablet Take 1 mg by mouth at bedtime.    Marland Kitchen buPROPion (WELLBUTRIN) 100 MG tablet Take 1 tablet (100 mg total) by mouth daily. 30 tablet 2  . escitalopram (LEXAPRO) 20 MG tablet Take 1 tablet (20 mg total) by mouth daily. 30 tablet 2   No current facility-administered medications for this visit.    Previous Psychotropic Medications:  Medication Dose                         Substance Abuse History in the last 12 months: Substance Age of 1st Use Last Use Amount Specific Type  Nicotine      Alcohol      Cannabis       Opiates      Cocaine      Methamphetamines      LSD      Ecstasy      Benzodiazepines      Caffeine      Inhalants      Others:                          Medical Consequences of Substance Abuse: n/a  Legal Consequences of Substance Abuse: n/a  Family Consequences of Substance Abuse: n/a  Blackouts:  No DT's:  No Withdrawal Symptoms:  No None  Social History: Current Place of Residence: [facility] of Birth: Same Family Members: Husband, 68 year old daughter Marital Status:  Married Children:   Sons:   Daughters: 1 Relationships:  Education:  Corporate treasurer Problems/Performance:  Religious Beliefs/Practices: Christian History of Abuse: none Teacher, music History:  None. Legal History: None Hobbies/Interests: Spending time with daughter  Family History:   Family History  Problem Relation Age of Onset  . Thyroid disease Mother   . Nephrolithiasis Mother   . Urolithiasis Mother   . Heart attack Mother     signs but neg eval  . Nephrolithiasis Father   . Urolithiasis Father   . Anxiety disorder Father   . Drug abuse Father   . Thyroid disease Sister   . Heart disease Maternal Grandmother     Mental Status Examination/Evaluation: Objective:  Appearance: Neat and Well Groomed  Eye Contact::  Good  Speech:  Clear and Coherent  Mood: Anxious     Affect: Congruent, seems to present as almost artificially happy   Thought Process:  Negative  Orientation:  Full (Time, Place, and Person)  Thought Content:  Negative  Suicidal Thoughts:no  Homicidal Thoughts:  No  Judgement:  Good  Insight:  Fair  Psychomotor Activity:  Normal  Akathisia:  No  Handed:  Right  AIMS (if indicated):    Assets:  Communication Skills Desire for Improvement Social Support    Laboratory/X-Ray Psychological Evaluation(s)        Assessment:  Axis I: Panic Disorder  AXIS I Panic Disorder  AXIS II Deferred  AXIS III Past Medical History   Diagnosis Date  . 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 Thyroiditis   . Acne   . Amenorrhea   . Migraine   . Restless legs syndrome      AXIS IV other psychosocial or environmental problems  AXIS V 61-70 mild symptoms   Treatment Plan/Recommendations:  Plan of Care: Medication management   Laboratory:    Psychotherapy: She is seeing Peggy Bynum   Medications: She'll restart Lexapro 20 mg daily for depression and change to Wellbutrin 100 mg every morning for depression and continue clonazepam 0.5 mg daily at bedtime for sleep and anxiety   Routine PRN Medications:  No  Consultations:  Safety Concerns:  She denies thoughts of harm to self or others   Other: She will return in 2 months     Diannia Ruder, MD 11/14/20161:29 PM

## 2015-11-01 ENCOUNTER — Encounter (HOSPITAL_COMMUNITY): Payer: Self-pay | Admitting: Psychiatry

## 2015-11-01 ENCOUNTER — Ambulatory Visit (INDEPENDENT_AMBULATORY_CARE_PROVIDER_SITE_OTHER): Payer: BLUE CROSS/BLUE SHIELD | Admitting: Psychiatry

## 2015-11-01 DIAGNOSIS — F411 Generalized anxiety disorder: Secondary | ICD-10-CM | POA: Diagnosis not present

## 2015-11-01 NOTE — Patient Instructions (Signed)
Discussed orally 

## 2015-11-01 NOTE — Progress Notes (Signed)
       THERAPIST PROGRESS NOTE  Session Time:    Wednesday 11/01/2015  3:10 PM - 3:57 PM  Participation Level: Active  Behavioral Response: CasualAlert/less anxious  Type of Therapy: Individual Therapy  Treatment Goals addressed:  Increase self acceptance  Improve assertiveness skills and ability to set and maintain boundaries  Improve mood and decrease anxiety response    Interventions: Supportive  Summary: Erica Larson is a 39 y.o. female who presents with a history of anxiety and anger outbursts that have been intermittent for several years. Patient reports often feeling enraged and out of control. She reports significant marital stress and trust issues in her marriage.   Patient reports increased stress, decreased interest in activities, depressed mood, crying spells, and poor motivation since last session. She admits sometimes just staying in pajamas for the day.  She and husband continue to have marital conflict. She reports additional stress related to feeling caught in the middle regarding husband's conflict with her mother. He also blames her for negativity in his relationship with their daughter. Patient also is experiencing grief loss issues triggered by the recent anniversaries of her father's and grandfather's deaths.    Suicidal/Homicidal: No  Therapist Response: Therapist works with patient to review symptoms, identify and verbalize feelings, identify ways to improve self-care Plan: Return again in 4 weeks.  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

## 2015-11-03 ENCOUNTER — Telehealth (HOSPITAL_COMMUNITY): Payer: Self-pay | Admitting: *Deleted

## 2015-11-03 DIAGNOSIS — S60811A Abrasion of right wrist, initial encounter: Secondary | ICD-10-CM | POA: Diagnosis not present

## 2015-11-03 DIAGNOSIS — X788XXA Intentional self-harm by other sharp object, initial encounter: Secondary | ICD-10-CM | POA: Insufficient documentation

## 2015-11-03 DIAGNOSIS — Y998 Other external cause status: Secondary | ICD-10-CM | POA: Diagnosis not present

## 2015-11-03 DIAGNOSIS — Z79899 Other long term (current) drug therapy: Secondary | ICD-10-CM | POA: Insufficient documentation

## 2015-11-03 DIAGNOSIS — F3181 Bipolar II disorder: Secondary | ICD-10-CM | POA: Diagnosis not present

## 2015-11-03 DIAGNOSIS — S60812A Abrasion of left wrist, initial encounter: Secondary | ICD-10-CM | POA: Diagnosis not present

## 2015-11-03 DIAGNOSIS — T424X2A Poisoning by benzodiazepines, intentional self-harm, initial encounter: Secondary | ICD-10-CM | POA: Insufficient documentation

## 2015-11-03 DIAGNOSIS — Z3202 Encounter for pregnancy test, result negative: Secondary | ICD-10-CM | POA: Insufficient documentation

## 2015-11-03 DIAGNOSIS — R45851 Suicidal ideations: Secondary | ICD-10-CM | POA: Diagnosis present

## 2015-11-03 DIAGNOSIS — F329 Major depressive disorder, single episode, unspecified: Secondary | ICD-10-CM | POA: Diagnosis not present

## 2015-11-03 DIAGNOSIS — F1310 Sedative, hypnotic or anxiolytic abuse, uncomplicated: Secondary | ICD-10-CM | POA: Diagnosis not present

## 2015-11-03 DIAGNOSIS — Y9289 Other specified places as the place of occurrence of the external cause: Secondary | ICD-10-CM | POA: Diagnosis not present

## 2015-11-03 DIAGNOSIS — Y9389 Activity, other specified: Secondary | ICD-10-CM | POA: Diagnosis not present

## 2015-11-03 NOTE — Telephone Encounter (Signed)
Pt called office stating that she wanted her life to be done. Asked pt what was going on and she stated that she just wanted everything to be easy. Tried to verify pt address but she refused to give it. Per pt, she did not want office to send the police to her house because she did not want to mess up her husbands' business. Per pt, she do not want her husband to be called either. Per pt, her husband works on cars from home and don't want his clients to see the cops come by her house. Asked pt if she took any medications and per pt she did. Per pt, she took 8 klonopin the night on 10-03-15. Then on 10-04-15, per pt, she stated she took additional 30 klonopin and 12 Ambien. Per pt, she just want to sleep and make everything easier. Per pt, she wants to just let Gigi Gin know that she loves her and thank you for everything she have done for her. Asked pt what happened for her her to say that she just want to make everything better and easier but pt would not say anything. Per pt, she wanted office to promise that the police would not come to the house. Asked pt who was around her and she stated that her dog was but her husband was in the shop at work outside. Pt then stated that her husband was coming inside and stated that she have to go now and hung up the know. While on the phone with pt, co-worker called for emergency to go to pt current address on file and they informed her that they will send someone out to her house immediately. Also while on the phone with pt to make sure she was ok before authorities came, both providers that pt see at office was informed of what was going on.

## 2015-11-03 NOTE — ED Notes (Signed)
Pt arrived with police to ED after taking 12 ambien,18 clonazepam and 4 requip throughout the day. Pt verbalized this to be an attempt at suicide and verbalized continuing SI. Pt denies HI. Pt reports having a hx of SI. Pt has also had a previous behavioral health eval at main campus [facility]. Pt verbalized hurting self due to "disagreement" with husband. Pt present drowsy and unable to keep eyes open. Pt is able to speak in complete sentences and is A&O x 4.

## 2015-11-04 ENCOUNTER — Encounter: Payer: Self-pay | Admitting: *Deleted

## 2015-11-04 ENCOUNTER — Emergency Department
Admission: EM | Admit: 2015-11-04 | Discharge: 2015-11-05 | Disposition: A | Discharge status: Other Acute Inpatient Hospital | Payer: BLUE CROSS/BLUE SHIELD | Attending: Emergency Medicine | Admitting: Emergency Medicine

## 2015-11-04 DIAGNOSIS — F3181 Bipolar II disorder: Secondary | ICD-10-CM | POA: Diagnosis not present

## 2015-11-04 DIAGNOSIS — T50902A Poisoning by unspecified drugs, medicaments and biological substances, intentional self-harm, initial encounter: Secondary | ICD-10-CM

## 2015-11-04 DIAGNOSIS — Z7289 Other problems related to lifestyle: Secondary | ICD-10-CM

## 2015-11-04 DIAGNOSIS — R45851 Suicidal ideations: Secondary | ICD-10-CM

## 2015-11-04 LAB — COMPREHENSIVE METABOLIC PANEL
ALBUMIN: 4.6 g/dL (ref 3.5–5.0)
ALT: 39 U/L (ref 14–54)
AST: 33 U/L (ref 15–41)
Alkaline Phosphatase: 71 U/L (ref 38–126)
Anion gap: 6 (ref 5–15)
BILIRUBIN TOTAL: 0.5 mg/dL (ref 0.3–1.2)
BUN: 18 mg/dL (ref 6–20)
CHLORIDE: 106 mmol/L (ref 101–111)
CO2: 25 mmol/L (ref 22–32)
CREATININE: 0.73 mg/dL (ref 0.44–1.00)
Calcium: 10.9 mg/dL — ABNORMAL HIGH (ref 8.9–10.3)
GFR calc Af Amer: >60 mL/min (ref >60)
GLUCOSE: 115 mg/dL — AB (ref 65–99)
POTASSIUM: 3.6 mmol/L (ref 3.5–5.1)
Sodium: 137 mmol/L (ref 135–145)
Total Protein: 8.4 g/dL — ABNORMAL HIGH (ref 6.5–8.1)

## 2015-11-04 LAB — CBC
HEMATOCRIT: 41.4 % (ref 35.0–47.0)
Hemoglobin: 13.8 g/dL (ref 12.0–16.0)
MCH: 29.1 pg (ref 26.0–34.0)
MCHC: 33.4 g/dL (ref 32.0–36.0)
MCV: 87.2 fL (ref 80.0–100.0)
Platelets: 301 10*3/uL (ref 150–440)
RBC: 4.75 MIL/uL (ref 3.80–5.20)
RDW: 13.3 % (ref 11.5–14.5)
WBC: 7.7 10*3/uL (ref 3.6–11.0)

## 2015-11-04 LAB — URINE DRUG SCREEN, QUALITATIVE (ARMC ONLY)
AMPHETAMINES, UR SCREEN: NOT DETECTED
Barbiturates, Ur Screen: NOT DETECTED
Benzodiazepine, Ur Scrn: POSITIVE — AB
CANNABINOID 50 NG, UR ~~LOC~~: NOT DETECTED
COCAINE METABOLITE, UR ~~LOC~~: NOT DETECTED
MDMA (ECSTASY) UR SCREEN: NOT DETECTED
Methadone Scn, Ur: NOT DETECTED
OPIATE, UR SCREEN: NOT DETECTED
PHENCYCLIDINE (PCP) UR S: NOT DETECTED
Tricyclic, Ur Screen: NOT DETECTED

## 2015-11-04 LAB — ACETAMINOPHEN LEVEL
Acetaminophen (Tylenol), Serum: <10 ug/mL — ABNORMAL LOW (ref 10–30)
Acetaminophen (Tylenol), Serum: <10 ug/mL — ABNORMAL LOW (ref 10–30)

## 2015-11-04 LAB — POCT PREGNANCY, URINE: Preg Test, Ur: NEGATIVE

## 2015-11-04 LAB — SALICYLATE LEVEL: Salicylate Lvl: <4 mg/dL (ref 2.8–30.0)

## 2015-11-04 LAB — ETHANOL

## 2015-11-04 MED ORDER — ALUM & MAG HYDROXIDE-SIMETH 200-200-20 MG/5ML PO SUSP: 30.0000 mL | ORAL | Status: DC | PRN

## 2015-11-04 MED ORDER — ROPINIROLE HCL 1 MG PO TABS: 1.0000 mg | ORAL_TABLET | Freq: Every day | ORAL | Status: DC

## 2015-11-04 MED ORDER — ZIPRASIDONE MESYLATE 20 MG IM SOLR
INTRAMUSCULAR | Status: AC
  Administered 2015-11-04: 10 mg via INTRAMUSCULAR
  Filled 2015-11-04: qty 20

## 2015-11-04 MED ORDER — MAGNESIUM HYDROXIDE 400 MG/5ML PO SUSP: 30.0000 mL | Freq: Every day | ORAL | Status: DC | PRN

## 2015-11-04 MED ORDER — ONDANSETRON HCL 4 MG PO TABS
4.0000 mg | ORAL_TABLET | Freq: Once | ORAL | Status: AC
  Administered 2015-11-04: 4 mg via ORAL
  Filled 2015-11-04: qty 1

## 2015-11-04 MED ORDER — ACETAMINOPHEN 325 MG PO TABS
650.0000 mg | ORAL_TABLET | Freq: Once | ORAL | Status: AC
  Administered 2015-11-04: 650 mg via ORAL
  Filled 2015-11-04: qty 2

## 2015-11-04 MED ORDER — ZIPRASIDONE MESYLATE 20 MG IM SOLR
10.0000 mg | Freq: Once | INTRAMUSCULAR | Status: AC
  Administered 2015-11-04: 10 mg via INTRAMUSCULAR

## 2015-11-04 MED ORDER — ZIPRASIDONE MESYLATE 20 MG IM SOLR: 10.0000 mg | Freq: Once | INTRAMUSCULAR | Status: DC

## 2015-11-04 MED ORDER — LEVOTHYROXINE SODIUM 100 MCG PO TABS: 50.0000 ug | ORAL_TABLET | Freq: Every day | ORAL | Status: DC

## 2015-11-04 MED ORDER — LURASIDONE HCL 40 MG PO TABS
20.0000 mg | ORAL_TABLET | Freq: Every day | ORAL | Status: DC
  Filled 2015-11-04 (×2): qty 1

## 2015-11-04 MED ORDER — HYDROXYZINE HCL 25 MG PO TABS: 25.0000 mg | ORAL_TABLET | Freq: Three times a day (TID) | ORAL | Status: DC | PRN

## 2015-11-04 MED ORDER — LURASIDONE HCL 20 MG PO TABS
20.0000 mg | ORAL_TABLET | Freq: Every day | ORAL | Status: DC
  Filled 2015-11-04: qty 1

## 2015-11-04 MED ORDER — ROPINIROLE HCL 1 MG PO TABS
1.0000 mg | ORAL_TABLET | Freq: Three times a day (TID) | ORAL | Status: DC
Start: 2015-11-04 — End: 2015-11-04
  Administered 2015-11-04: 1 mg via ORAL
  Filled 2015-11-04: qty 1

## 2015-11-04 MED ORDER — ACETAMINOPHEN 325 MG PO TABS: 650.0000 mg | ORAL_TABLET | Freq: Four times a day (QID) | ORAL | Status: DC | PRN

## 2015-11-04 MED ORDER — TRAZODONE HCL 50 MG PO TABS: 50.0000 mg | ORAL_TABLET | Freq: Every evening | ORAL | Status: DC | PRN

## 2015-11-04 NOTE — ED Provider Notes (Signed)
[facility] Emergency Department Provider Note  ____________________________________________  Time seen: Approximately  108 AM  I have reviewed the triage vital signs and the nursing notes.   HISTORY  Chief Complaint Suicidal    HPI Erica Larson is a 39 y.o. female who comes into the hospital today after taking medications and cutting her wrists. The patient reports that she took Klonopin more than she should have, Ambien and frequent. She reports that she started taking pills around 11:30 this morning and was taking them all day. The patient reports that she wanted to numb the pain because it's been a long week. She reports that a long time ago she took pills to try to kill herself but never cut her wrists. The patient reports that she is depressed but does not have any anxiety. She does not feel like herself. The patient denies any voices has not been drinking doing drugs or smoking. The patient denies any suicidal ideation.   Past Medical History  Diagnosis Date  . 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 Thyroiditis   . Acne   . Amenorrhea   . Migraine   . Restless legs syndrome     Patient Active Problem List   Diagnosis Date Noted  . Anxiety state, unspecified 09/27/2013  . Fatigue 01/02/2011  . Leg cramps 01/02/2011  . VITAMIN D DEFICIENCY 01/05/2009  . Hypercalcemia 01/05/2009  . NUMBNESS 01/05/2009  . THYROIDITIS 01/02/2009  . PALPITATIONS, RECURRENT 01/02/2009  . SINUSITIS- ACUTE-NOS 08/15/2008  . FEMALE INFERTILITY 07/18/2008  . WEIGHT GAIN 07/18/2008  . ACUTE TONSILLITIS 12/30/2007  . URI 12/30/2007  . MIGRAINE HEADACHE 08/12/2007  . AMENORRHEA 08/12/2007    Past Surgical History  Procedure Laterality Date  . Dilation and curettage of uterus      Current Outpatient Rx  Name  Route  Sig  Dispense  Refill  . buPROPion (WELLBUTRIN) 100 MG tablet   Oral   Take 1 tablet (100 mg total) by mouth  daily.   30 tablet   2   . clonazePAM (KLONOPIN) 0.5 MG tablet      Take one per day as needed for anxiety   60 tablet   2   . escitalopram (LEXAPRO) 20 MG tablet   Oral   Take 1 tablet (20 mg total) by mouth daily.   30 tablet   2   . ibuprofen (ADVIL,MOTRIN) 600 MG tablet   Oral   Take 1 tablet (600 mg total) by mouth every 6 (six) hours as needed.   30 tablet   0   . levothyroxine (SYNTHROID, LEVOTHROID) 25 MCG tablet   Oral   Take 50 mcg by mouth.          Marland Kitchen PHENTERMINE HCL PO   Oral   Take 18.75 mg by mouth daily.         Marland Kitchen rOPINIRole (REQUIP) 1 MG tablet   Oral   Take 1 mg by mouth at bedtime.           Allergies Review of patient's allergies indicates no known allergies.  Family History  Problem Relation Age of Onset  . Thyroid disease Mother   . Nephrolithiasis Mother   . Urolithiasis Mother   . Heart attack Mother     signs but neg eval  . Nephrolithiasis Father   . Urolithiasis Father   . Anxiety disorder Father   . Drug abuse Father   .  Thyroid disease Sister   . Heart disease Maternal Grandmother     Social History Social History  Substance Use Topics  . Smoking status: Never Smoker   . Smokeless tobacco: None  . Alcohol Use: No    Review of Systems Constitutional: No fever/chills Eyes: No visual changes. ENT: No sore throat. Cardiovascular: Denies chest pain. Respiratory: Denies shortness of breath. Gastrointestinal: No abdominal pain.  No nausea, no vomiting.  No diarrhea.  No constipation. Genitourinary: Negative for dysuria. Musculoskeletal: Negative for back pain. Skin: Negative for rash. Neurological: Negative for headaches, focal weakness or numbness. Psych: Depression and suicidal ideation  10-point ROS otherwise negative.  ____________________________________________   PHYSICAL EXAM:  VITAL SIGNS: ED Triage Vitals  Enc Vitals Group     BP 11/03/15 2342 112/80 mmHg     Pulse Rate 11/03/15 2342 97     Resp  11/03/15 2342 16     Temp 11/03/15 2342 97.6 F (36.4 C)     Temp Source 11/03/15 2342 Oral     SpO2 11/03/15 2342 97 %     Weight 11/03/15 2342 173 lb (78.472 kg)     Height 11/03/15 2342  (1.676 m)     Head Cir --      Peak Flow --      Pain Score 11/04/15 0752 8     Pain Loc --      Pain Edu? --      Excl. in GC? --     Constitutional: Alert and oriented. Well appearing and in no acute distress. Eyes: Conjunctivae are normal. PERRL. EOMI. Head: Atraumatic. Nose: No congestion/rhinnorhea. Mouth/Throat: Mucous membranes are moist.  Oropharynx non-erythematous. Cardiovascular: Normal rate, regular rhythm. Grossly normal heart sounds.  Good peripheral circulation. Respiratory: Normal respiratory effort.  No retractions. Lungs CTAB. Gastrointestinal: Soft and nontender. No distention.  Musculoskeletal: No lower extremity tenderness nor edema.   Neurologic:  Normal speech and language.  Skin:  Skin is warm, dry with superficial abrasions to bilateral wrists.  Psychiatric: Mood and affect are normal.   ____________________________________________   LABS (all labs ordered are listed, but only abnormal results are displayed)  Labs Reviewed  COMPREHENSIVE METABOLIC PANEL - Abnormal; Notable for the following:    Glucose, Bld 115 (*)    Calcium 10.9 (*)    Total Protein 8.4 (*)    All other components within normal limits  ACETAMINOPHEN LEVEL - Abnormal; Notable for the following:    Acetaminophen (Tylenol), Serum <10 (*)    All other components within normal limits  URINE DRUG SCREEN, QUALITATIVE ([facility] ONLY) - Abnormal; Notable for the following:    Benzodiazepine, Ur Scrn POSITIVE (*)    All other components within normal limits  ACETAMINOPHEN LEVEL - Abnormal; Notable for the following:    Acetaminophen (Tylenol), Serum <10 (*)    All other components within normal limits  ETHANOL  SALICYLATE LEVEL  CBC  POC URINE PREG, ED  POCT PREGNANCY, URINE    ____________________________________________  EKG  ED ECG REPORT I, Rebecka Apley, the attending physician, personally viewed and interpreted this ECG.   Date: 11/04/2015  EKG Time: 0051  Rate: 79  Rhythm: normal sinus rhythm  Axis: normal  Intervals:none  ST&T Change: normal  ____________________________________________  RADIOLOGY  none ____________________________________________   PROCEDURES  Procedure(s) performed: None  Critical Care performed: No  ____________________________________________   INITIAL IMPRESSION / ASSESSMENT AND PLAN / ED COURSE  Pertinent labs & imaging results that were available during  my care of the patient were reviewed by me and considered in my medical decision making (see chart for details).  This is a 39 year old female who was brought into the hospital today after overdosing on medications that she took throughout the day. We contacted poison control and they recommended 6 hour ops. After 6 hour observation the patient had no complaints or concerns. The patient will be evaluated by psych for further evaluation of her overdose, cutting and suicidal ideation. ____________________________________________   FINAL CLINICAL IMPRESSION(S) / ED DIAGNOSES  Final diagnoses:  Overdose, intentional self-harm, initial encounter [facility])  Deliberate self-cutting  Suicidal ideation      Rebecka Apley, MD 11/04/15 [phone]
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
## 2015-11-04 NOTE — ED Notes (Signed)
Patient ask for something for anxiety, MD will not order anything at this time due to all the sedatives she took yesterday, patient voiced understanding. Patient is alert and oriented, but has been sleeping on and off today, patient denies wanting to harm self at this time, refuses all meals, patient being monitored per camera and per 15 min checks.
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
## 2015-11-04 NOTE — ED Notes (Signed)
Spoke with Poison Control on final blood lab results. Poison control will close case. No new recommendations.
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
## 2015-11-04 NOTE — ED Notes (Signed)
Pt sleeping on the bed. Attempted to wake pt to take her medication. Pt not responding to loud verbal stimuli. Pt given sternal rub and pushed this RN away at that time. Pt refused her medication at this time.

## 2015-11-04 NOTE — ED Notes (Signed)
Pt. To ED-[facility] from ED ambulatory without difficulty, to room  . Report from RN. Pt. Is alert and oriented, warm and dry in no distress. Pt. Verbalizes having SI, with having taken an overdose; UDS is positive for benzos; also noted cutting to right wrist; denies having HI, and AVH. Pt. Calm and cooperative. Pt. Made aware of security cameras and Q15 minute rounds. Pt. Encouraged to let Nursing staff know of any concerns or needs.

## 2015-11-04 NOTE — ED Notes (Signed)
Patient took shower.

## 2015-11-04 NOTE — BHH Counselor (Addendum)
Pt was assigned to [facility] room 303B.  Due to staffing issues, admit to the [facility] put on hold.  Will seek placement options with [facility] [address], [place], [place], [place] and [place].  12:32 am Received call from [place] RN, reporting that patient can now be accepted for admission.  Pt will be placed in room 306B.

## 2015-11-04 NOTE — ED Notes (Addendum)
Pt visitor at bedside with supervision of security via cameras and RN at bedside.

## 2015-11-04 NOTE — Consult Note (Signed)
[facility] Face-to-Face Psychiatry Consult   Reason for Consult:  Medication overdose Referring Physician:  Loney Hering, MD  Patient Identification: Erica Larson MRN:  [id number] Principal Diagnosis: <principal problem not specified> Diagnosis:   Patient Active Problem List   Diagnosis Date Noted  . Anxiety state, unspecified [F41.1] 09/27/2013  . Fatigue [R53.83] 01/02/2011  . Leg cramps [R25.2] 01/02/2011  . VITAMIN D DEFICIENCY [E55.9] 01/05/2009  . Hypercalcemia [E83.52] 01/05/2009  . NUMBNESS [R20.9] 01/05/2009  . THYROIDITIS [E06.9] 01/02/2009  . PALPITATIONS, RECURRENT [R00.2] 01/02/2009  . SINUSITIS- ACUTE-NOS [J01.90] 08/15/2008  . FEMALE INFERTILITY [N97.9] 07/18/2008  . WEIGHT GAIN [R63.5] 07/18/2008  . ACUTE TONSILLITIS [J03.90] 12/30/2007  . URI [J06.9] 12/30/2007  . MIGRAINE HEADACHE [G43.909] 08/12/2007  . AMENORRHEA [N91.2] 08/12/2007    Total Time spent with patient: 1 hour  Subjective:   Erica Larson is a 39 y.o. female married white female with a h/o panic disorder who presents to [facility] ED today after taking varying reported amounts of clonazepam, zolpidem and Requip and cut her wrists in an attempt to end her life. Per the Registered Medical Assistant notes, the pt called police and stated she wanted her life to be done. Per this report, on 10-03-15, she took 8 clonazepam and on 10-04-15 she took 30 clonazepam and 12 zolpidem.   On the evening of 11-03-15, the pt took 12 zolpidem, 18 clonazepam and 4 Requip throughout the day. She verbalized to nursing last night that this was a suicide attempt. She continued to verbalize SI and denies HI.  Pt reports a h/o suicidal ideation. Pt continued to verbalize SI this morning at 6:55am per nursing reports. Pt continues to deny HI and AVH.     Today on interview, when asked why she was admitted, the pt stretched out her arms, and showed me her wrists. She cut her wrists several times fairly superficially. She explained  she has increasing stress related to her 46 year marriage. Due to increased stress yesterday, the pt shared she overdosed on 16  Ibuprofen, 4 zolpidem and 5 escitalopram. The pt states, "I just wanted it all to end." She shared if she were dead, the financial issues at home would not continue to occur.    She endorses feelings of depression and sadness lasting several months. She endorses decreased sleep, anhedonia, guilt, decreased energy, decreased concentration and decreased appetite. She endorses periods of decreased sleep without fatigue, increased impulsivity, increased talkativeness, increased distractibility and flight of ideas. This last occurred in the past couple of weeks.   She endorses hopelessness and worthlessness. She denies feelings of helplessness. S  She continues to attend appts with her psychiatrist and psychotherapist. She notes this is helpful and she wants to continue to attend. Her last appt was 10-02-2015 with Dr. Harrington Challenger and her current medications include bupropion 170m tablet daily, escitalopram 283mpo QD, clonazepam 0.53m54mo QHS for anxiety, ropinirole 1mg48m QHS, synthroid 50mc39m Qam and ibuprofen 600mg 48m6H/prn pain. Pt shared she is medication compliant but stopped escitalopram once for about 30 days due to sexual side effects. Pt realized her mood worsened at that time. The pt feels her medications are helpful but she overdosed on several of them as noted above. Pt denies drug and alcohol use.   Pt denies SI, HI and AVH at this time.   Past Psychiatric History: Dx: Panic Disorder Psychiatrist: DeboraCloria Springit appears pt last saw her on 10-02-15) Therapist: Peggy Maurice Smallitalizations:  Denies ECT: Denies Suicide attempt/Self-harm: Endorses Homicide attempts/harming others: Denies Previous Medication Trials: Denies   Risk to Self:   Risk to Others:   Prior Inpatient Therapy:   Prior Outpatient Therapy:    Past Medical History:  Past Medical  History  Diagnosis Date  . Infertility, female     treatment with oligammenorhea  Dr Carren Rang   . Thyroiditis   . Acne   . Amenorrhea   . Migraine   . Restless legs syndrome     Past Surgical History  Procedure Laterality Date  . Dilation and curettage of uterus     Family History:  Family History  Problem Relation Age of Onset  . Thyroid disease Mother   . Nephrolithiasis Mother   . Urolithiasis Mother   . Heart attack Mother     signs but neg eval  . Nephrolithiasis Father   . Urolithiasis Father   . Anxiety disorder Father   . Drug abuse Father   . Thyroid disease Sister   . Heart disease Maternal Grandmother    Family Psychiatric History:  Depression: MGrM Anxiety: Mother, PGrM Bipolar disorder: Denies Schizophrenia: Denies Panic disorder: Denies  Substance abuse: Father Alcohol: Father  Suicide: Denies   Social History:  History  Alcohol Use No     History  Drug Use No    Social History   Social History  . Marital Status: Married    Spouse Name: N/A  . Number of Children: N/A  . Years of Education: N/A   Social History Main Topics  . Smoking status: Never Smoker   . Smokeless tobacco: None  . Alcohol Use: No  . Drug Use: No  . Sexual Activity: Yes    Birth Control/ Protection: Inserts, IUD   Other Topics Concern  . 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  Legal History: None  Hobbies/Interests: Spending time with daughter   Allergies:  No Known Allergies  Labs:  Results for orders placed or performed during  the hospital encounter of 11/04/15 (from the past 48 hour(s))  Comprehensive metabolic panel     Status: Abnormal   Collection Time: 11/03/15 11:58 PM  Result Value Ref Range   Sodium 137 135 - 145 mmol/L   Potassium 3.6 3.5 - 5.1 mmol/L   Chloride 106 101 - 111 mmol/L   CO2 25 22 - 32 mmol/L   Glucose, Bld 115 (H) 65 - 99 mg/dL   BUN 18 6 - 20 mg/dL   Creatinine, Ser 0.73 0.44 - 1.00 mg/dL   Calcium 10.9 (H) 8.9 - 10.3 mg/dL   Total Protein 8.4 (H) 6.5 - 8.1 g/dL   Albumin 4.6 3.5 - 5.0 g/dL   AST 33 15 - 41 U/L   ALT 39 14 - 54 U/L   Alkaline Phosphatase 71 38 - 126 U/L   Total Bilirubin 0.5 0.3 - 1.2 mg/dL   GFR calc non Af Amer >60 >60 mL/min   GFR calc Af Amer >60 >60 mL/min    Comment: (NOTE) The eGFR has been calculated using the CKD EPI equation. This calculation has not been  validated in all clinical situations. eGFR's persistently <60 mL/min signify possible Chronic Kidney Disease.    Anion gap 6 5 - 15  Ethanol (ETOH)     Status: None   Collection Time: 11/03/15 11:58 PM  Result Value Ref Range   Alcohol, Ethyl (B) <5 <5 mg/dL    Comment:        LOWEST DETECTABLE LIMIT FOR SERUM ALCOHOL IS 5 mg/dL FOR MEDICAL PURPOSES ONLY   Salicylate level     Status: None   Collection Time: 11/03/15 11:58 PM  Result Value Ref Range   Salicylate Lvl <0.9 2.8 - 30.0 mg/dL  Acetaminophen level     Status: Abnormal   Collection Time: 11/03/15 11:58 PM  Result Value Ref Range   Acetaminophen (Tylenol), Serum <10 (L) 10 - 30 ug/mL    Comment:        THERAPEUTIC CONCENTRATIONS VARY SIGNIFICANTLY. A RANGE OF 10-30 ug/mL MAY BE AN EFFECTIVE CONCENTRATION FOR MANY PATIENTS. HOWEVER, SOME ARE BEST TREATED AT CONCENTRATIONS OUTSIDE THIS RANGE. ACETAMINOPHEN CONCENTRATIONS >150 ug/mL AT 4 HOURS AFTER INGESTION AND >50 ug/mL AT 12 HOURS AFTER INGESTION ARE OFTEN ASSOCIATED WITH TOXIC REACTIONS.   CBC     Status: None   Collection Time: 11/03/15 11:58 PM  Result Value Ref  Range   WBC 7.7 3.6 - 11.0 K/uL   RBC 4.75 3.80 - 5.20 MIL/uL   Hemoglobin 13.8 12.0 - 16.0 g/dL   HCT 41.4 35.0 - 47.0 %   MCV 87.2 80.0 - 100.0 fL   MCH 29.1 26.0 - 34.0 pg   MCHC 33.4 32.0 - 36.0 g/dL   RDW 13.3 11.5 - 14.5 %   Platelets 301 150 - 440 K/uL  Acetaminophen level     Status: Abnormal   Collection Time: 11/04/15  3:00 AM  Result Value Ref Range   Acetaminophen (Tylenol), Serum <10 (L) 10 - 30 ug/mL    Comment:        THERAPEUTIC CONCENTRATIONS VARY SIGNIFICANTLY. A RANGE OF 10-30 ug/mL MAY BE AN EFFECTIVE CONCENTRATION FOR MANY PATIENTS. HOWEVER, SOME ARE BEST TREATED AT CONCENTRATIONS OUTSIDE THIS RANGE. ACETAMINOPHEN CONCENTRATIONS >150 ug/mL AT 4 HOURS AFTER INGESTION AND >50 ug/mL AT 12 HOURS AFTER INGESTION ARE OFTEN ASSOCIATED WITH TOXIC REACTIONS.   Urine Drug Screen, Qualitative ([facility] only)     Status: Abnormal   Collection Time: 11/04/15  3:41 AM  Result Value Ref Range   Tricyclic, Ur Screen NONE DETECTED NONE DETECTED   Amphetamines, Ur Screen NONE DETECTED NONE DETECTED   MDMA (Ecstasy)Ur Screen NONE DETECTED NONE DETECTED   Cocaine Metabolite,Ur  [place] NONE DETECTED NONE DETECTED   Opiate, Ur Screen NONE DETECTED NONE DETECTED   Phencyclidine (PCP) Ur S NONE DETECTED NONE DETECTED   Cannabinoid 50 Ng, Ur [place] NONE DETECTED NONE DETECTED   Barbiturates, Ur Screen NONE DETECTED NONE DETECTED   Benzodiazepine, Ur Scrn POSITIVE (A) NONE DETECTED   Methadone Scn, Ur NONE DETECTED NONE DETECTED    Comment: (NOTE) 604  Tricyclics, urine               Cutoff 1000 ng/mL 200  Amphetamines, urine             Cutoff 1000 ng/mL 300  MDMA (Ecstasy), urine           Cutoff 500 ng/mL 400  Cocaine Metabolite, urine       Cutoff 300 ng/mL 500  Opiate, urine  Cutoff 300 ng/mL 600  Phencyclidine (PCP), urine      Cutoff 25 ng/mL 700  Cannabinoid, urine              Cutoff 50 ng/mL 800  Barbiturates, urine             Cutoff 200 ng/mL 900   Benzodiazepine, urine           Cutoff 200 ng/mL 1000 Methadone, urine                Cutoff 300 ng/mL 1100 1200 The urine drug screen provides only a preliminary, unconfirmed 1300 analytical test result and should not be used for non-medical 1400 purposes. Clinical consideration and professional judgment should 1500 be applied to any positive drug screen result due to possible 1600 interfering substances. A more specific alternate chemical method 1700 must be used in order to obtain a confirmed analytical result.  1800 Gas chromato graphy / mass spectrometry (GC/MS) is the preferred 1900 confirmatory method.   Pregnancy, urine POC     Status: None   Collection Time: 11/04/15  3:47 AM  Result Value Ref Range   Preg Test, Ur NEGATIVE NEGATIVE    Comment:        THE SENSITIVITY OF THIS METHODOLOGY IS >24 mIU/mL     Current Facility-Administered Medications  Medication Dose Route Frequency Provider Last Rate Last Dose  . levothyroxine (SYNTHROID, LEVOTHROID) tablet 50 mcg  50 mcg Oral Daily Harvest Dark, MD       Current Outpatient Prescriptions  Medication Sig Dispense Refill  . buPROPion (WELLBUTRIN) 100 MG tablet Take 1 tablet (100 mg total) by mouth daily. 30 tablet 2  . clonazePAM (KLONOPIN) 0.5 MG tablet Take one per day as needed for anxiety 60 tablet 2  . escitalopram (LEXAPRO) 20 MG tablet Take 1 tablet (20 mg total) by mouth daily. 30 tablet 2  . ibuprofen (ADVIL,MOTRIN) 600 MG tablet Take 1 tablet (600 mg total) by mouth every 6 (six) hours as needed. 30 tablet 0  . levothyroxine (SYNTHROID, LEVOTHROID) 25 MCG tablet Take 50 mcg by mouth.     Marland Kitchen PHENTERMINE HCL PO Take 18.75 mg by mouth daily.    Marland Kitchen rOPINIRole (REQUIP) 1 MG tablet Take 1 mg by mouth at bedtime.      Musculoskeletal: Strength & Muscle Tone: within normal limits Gait & Station: normal Patient leans: N/A  Psychiatric Specialty Exam: Review of Systems  Neurological: Positive for tingling.   Psychiatric/Behavioral: Positive for depression and suicidal ideas. Negative for hallucinations and memory loss. The patient has insomnia. The patient is not nervous/anxious.   All other systems reviewed and are negative.   Blood pressure 93/77, pulse 87, temperature 97.8 F (36.6 C), temperature source Oral, resp. rate 20, height 5' 6"  (1.676 m), weight 78.472 kg (173 lb), SpO2 98 %.Body mass index is 27.94 kg/(m^2).  General Appearance: Fairly Groomed  Engineer, water::  Good  Speech:  Clear and Coherent and Normal Rate  Volume:  Decreased  Mood:  "I'm ok."  Affect:  Restricted and meek   Thought Process:  Coherent, Goal Directed, Intact, Linear and Logical  Orientation:  Full (Time, Place, and Person)  Thought Content:  Negative  Suicidal Thoughts:  No  Homicidal Thoughts:  No  Memory:  Immediate;   Good Recent;   Good Remote;   Good  Judgement:  Poor  Insight:  Lacking  Psychomotor Activity:  Normal  Concentration:  Good  Recall:  [place]  of Knowledge:Good  Language: Good  Akathisia:  Negative  Handed:  Right  AIMS (if indicated):     Assets:  Communication Skills Desire for Improvement Financial Resources/Insurance Housing Intimacy Social Support Talents/Skills Transportation Vocational/Educational  ADL's:  Intact  Cognition: WNL  Sleep:      Treatment Plan Summary: Medication management   Pt likely has a diagnosis of bipolar 2 disorder, MDE. Pt exhibits symptoms consistent with this diagnosis. She is currently on 2 antidepressants which will be stopped during this hospitalization. The pt is willing to try Latuda starting at 74m po QD after dinner to treat bipolar depression. I do not necessarily want to start an antidepressant which may cause mania in the pt. Pt denies SI, HI and AVH.   Disposition: Recommend psychiatric Inpatient admission when medically cleared.  1. Continue IVC.  2. Will seek inpatient psychiatric hospitalization.  3. Will not start home  psychotropic medications at this time.  4. Bipolar 2 disorder, MDE  - Start Latuda 279mpo QD.  5. R/B/Se discussed including but not limited to any and all black box warnings, metabolic derangements, weight gain, increased appetite, mood derangements and need to take Latuda after dinner for better absorption.  6. Case discussed with ED physician and SW.    McDonita Brooks2/17/2016 10:46 AM

## 2015-11-04 NOTE — ED Notes (Signed)
Pt standing on her bed. Appears to be messing with the light fixture. This RN and ODS Geologist, engineering in to speak with pt. Pt asked to not stand on the bed. Pt refusing to get off the bed and asking to see the nursing supervisor. Nursing supervisor called and will come to see the patient.

## 2015-11-04 NOTE — ED Provider Notes (Signed)
-----------------------------------------   11:34 PM on 11/04/2015 -----------------------------------------   Blood pressure 108/75, pulse 79, temperature 98.1 F (36.7 C), temperature source Oral, resp. rate 18, height 5\' 6"  (1.676 m), weight 78.472 kg, SpO2 98 %.  The patient became agitated and combative and required 10 mg of Geodon IM while in the tissue.  She was subsequently taken down stairs after being admitted to the psych unit.  Loleta Rose, MD 11/05/15 [phone]

## 2015-11-04 NOTE — ED Notes (Signed)
Patient complain of nausea and headache and nurse received order for tylenol and zofran for patient. Patient took without difficulty.

## 2015-11-04 NOTE — ED Notes (Signed)
Spoke with Alona Bene, Poison Control   Requip: Monitor N/V, Headache, Seizure Activity  Benzo: Monitor respiratory depression   Gather EKG,  tylenol, aspirin, and liver functions with 6-hour observation period

## 2015-11-04 NOTE — ED Notes (Signed)
Hand off report given to Margaret, RN

## 2015-11-04 NOTE — ED Notes (Signed)
Dr. Jenne Campus in room talking with patient.

## 2015-11-04 NOTE — ED Notes (Signed)
Meal tray given 

## 2015-11-04 NOTE — ED Notes (Signed)
ENVIRONMENTAL ASSESSMENT  Potentially harmful objects out of patient reach: Yes.  Personal belongings secured: Yes.  Patient dressed in hospital provided attire only: Yes.  Plastic bags out of patient reach: Yes.  Patient care equipment (cords, cables, call bells, lines, and drains) shortened, removed, or accounted for: Yes.  Equipment and supplies removed from bottom of stretcher: Yes.  Potentially toxic materials out of patient reach: Yes.  Sharps container removed or out of patient reach: Yes.   ED BHU PLACEMENT JUSTIFICATION  Is the patient under IVC or is there intent for IVC: Yes.  Is the patient medically cleared: Yes.  Is there vacancy in the ED BHU: Yes.  Is the population mix appropriate for patient: Yes.  Is the patient awaiting placement in inpatient or outpatient setting: Yes.  Has the patient had a psychiatric consult: Yes.  Survey of unit performed for contraband, proper placement and condition of furniture, tampering with fixtures in bathroom, shower, and each patient room: Yes. Findings: All clear  APPEARANCE/BEHAVIOR  calm, cooperative and adequate rapport can be established  NEURO ASSESSMENT  Orientation: time, place and person  Hallucinations: No.None noted (Hallucinations)  Speech: Normal  Gait: normal  RESPIRATORY ASSESSMENT  WNL  CARDIOVASCULAR ASSESSMENT  WNL  GASTROINTESTINAL ASSESSMENT  WNL  EXTREMITIES  WNL  PLAN OF CARE  Provide calm/safe environment. Vital signs assessed twice daily. ED BHU Assessment once each 12-hour shift. Collaborate with intake RN daily or as condition indicates. Assure the ED provider has rounded once each shift. Provide and encourage hygiene. Provide redirection as needed. Assess for escalating behavior; address immediately and inform ED provider.  Assess family dynamic and appropriateness for visitation as needed: Yes. ; If necessary, describe findings:  Educate the patient/family about BHU procedures/visitation: Yes.  If  necessary, describe findings: Pt is calm and cooperative at this time. Pt understanding and accepting of unit procedures/rules. Will continue to monitor with Q 15 min safety rounds and observation via security camera.

## 2015-11-04 NOTE — ED Notes (Signed)
Patient alert and oriented, patient had three visitors today, patient states that it did make her feel better, Patient is safe, watching tv.

## 2015-11-04 NOTE — ED Notes (Signed)
Report received from Garnetta Buddy., RN. Pt. Alert and oriented in no distress; verbalizes having SI, with having taken an overdose; UDS is positive for benzos; also noted cutting at right wrist; denies having HI, AVH and pain.  Pt. Instructed to come to me with problems or concerns.Will continue to monitor for safety via security cameras and Q 15 minute checks.

## 2015-11-04 NOTE — ED Notes (Signed)
Pt given lunch tray.

## 2015-11-04 NOTE — ED Notes (Signed)
Made MD Zenda Alpers made aware of Poison Control recommendations.

## 2015-11-04 NOTE — ED Notes (Signed)
BEHAVIORAL HEALTH ROUNDING  Patient sleeping: No.  Patient alert and oriented: yes  Behavior appropriate: Yes. ; If no, describe:  Nutrition and fluids offered: Yes  Toileting and hygiene offered: Yes  Sitter present: not applicable, Q 15 min safety rounds and observation via security camera. Law enforcement present: Yes ODS  

## 2015-11-04 NOTE — ED Notes (Signed)
Pt given breakfast tray. Pt in bed, eating and watching TV at this time.

## 2015-11-04 NOTE — ED Notes (Signed)
[place], [place] [phone]  Cell [phone]

## 2015-11-04 NOTE — ED Notes (Signed)
Patient has been sleeping on and off today, patient is aware that she will be admitted to behavioral medicine.

## 2015-11-04 NOTE — ED Notes (Signed)
BEHAVIORAL HEALTH ROUNDING  Patient sleeping: No.  Patient alert and oriented: yes  Behavior appropriate: No. ; If no, describe: Yelling at staff. Putting her finger down her throat trying to make herself throw up.  Nutrition and fluids offered: Yes  Toileting and hygiene offered: Yes  Sitter present: not applicable, Q 15 min safety rounds and observation via security camera. Law enforcement present: Yes ODS

## 2015-11-05 ENCOUNTER — Inpatient Hospital Stay
Admission: AD | Admit: 2015-11-05 | Discharge: 2015-11-05 | DRG: 885 | Disposition: A | Discharge status: Home / Self Care | Payer: BLUE CROSS/BLUE SHIELD | Source: Ambulatory Visit | Attending: Psychiatry | Admitting: Psychiatry

## 2015-11-05 DIAGNOSIS — Z79899 Other long term (current) drug therapy: Secondary | ICD-10-CM | POA: Diagnosis not present

## 2015-11-05 DIAGNOSIS — Z915 Personal history of self-harm: Secondary | ICD-10-CM

## 2015-11-05 DIAGNOSIS — T428X2A Poisoning by antiparkinsonism drugs and other central muscle-tone depressants, intentional self-harm, initial encounter: Secondary | ICD-10-CM | POA: Diagnosis present

## 2015-11-05 DIAGNOSIS — R45851 Suicidal ideations: Secondary | ICD-10-CM | POA: Diagnosis present

## 2015-11-05 DIAGNOSIS — Z818 Family history of other mental and behavioral disorders: Secondary | ICD-10-CM | POA: Diagnosis not present

## 2015-11-05 DIAGNOSIS — T426X2A Poisoning by other antiepileptic and sedative-hypnotic drugs, intentional self-harm, initial encounter: Secondary | ICD-10-CM | POA: Diagnosis present

## 2015-11-05 DIAGNOSIS — F3181 Bipolar II disorder: Secondary | ICD-10-CM | POA: Diagnosis present

## 2015-11-05 DIAGNOSIS — E039 Hypothyroidism, unspecified: Secondary | ICD-10-CM | POA: Diagnosis present

## 2015-11-05 DIAGNOSIS — T424X2A Poisoning by benzodiazepines, intentional self-harm, initial encounter: Secondary | ICD-10-CM | POA: Diagnosis present

## 2015-11-05 DIAGNOSIS — G2581 Restless legs syndrome: Secondary | ICD-10-CM | POA: Diagnosis present

## 2015-11-05 DIAGNOSIS — T43222A Poisoning by selective serotonin reuptake inhibitors, intentional self-harm, initial encounter: Secondary | ICD-10-CM | POA: Diagnosis present

## 2015-11-05 DIAGNOSIS — Z8249 Family history of ischemic heart disease and other diseases of the circulatory system: Secondary | ICD-10-CM | POA: Diagnosis not present

## 2015-11-05 DIAGNOSIS — G4700 Insomnia, unspecified: Secondary | ICD-10-CM | POA: Diagnosis present

## 2015-11-05 DIAGNOSIS — E069 Thyroiditis, unspecified: Secondary | ICD-10-CM | POA: Diagnosis present

## 2015-11-05 MED ORDER — TRAZODONE HCL 50 MG PO TABS
50.0000 mg | ORAL_TABLET | Freq: Every evening | ORAL | Status: DC | PRN
  Administered 2015-11-05: 50 mg via ORAL
  Filled 2015-11-05: qty 1

## 2015-11-05 MED ORDER — BUPROPION HCL 100 MG PO TABS
100.0000 mg | ORAL_TABLET | Freq: Every morning | ORAL | Status: DC
  Administered 2015-11-05: 100 mg via ORAL
  Filled 2015-11-05: qty 1

## 2015-11-05 MED ORDER — TRAZODONE HCL 50 MG PO TABS: 50.0000 mg | ORAL_TABLET | Freq: Every evening | ORAL | Status: DC | PRN

## 2015-11-05 MED ORDER — MAGNESIUM HYDROXIDE 400 MG/5ML PO SUSP: 30.0000 mL | Freq: Every day | ORAL | Status: DC | PRN

## 2015-11-05 MED ORDER — LEVOTHYROXINE SODIUM 25 MCG PO TABS
50.0000 ug | ORAL_TABLET | Freq: Every day | ORAL | Status: DC
  Administered 2015-11-05: 50 ug via ORAL
  Filled 2015-11-05: qty 2

## 2015-11-05 MED ORDER — LURASIDONE HCL 40 MG PO TABS: 40.0000 mg | ORAL_TABLET | Freq: Every day | ORAL | Status: DC

## 2015-11-05 MED ORDER — ROPINIROLE HCL 1 MG PO TABS
1.0000 mg | ORAL_TABLET | Freq: Three times a day (TID) | ORAL | Status: DC
  Administered 2015-11-05: 1 mg via ORAL
  Filled 2015-11-05: qty 1

## 2015-11-05 MED ORDER — ALUM & MAG HYDROXIDE-SIMETH 200-200-20 MG/5ML PO SUSP: 30.0000 mL | ORAL | Status: DC | PRN

## 2015-11-05 MED ORDER — ROPINIROLE HCL 1 MG PO TABS
1.0000 mg | ORAL_TABLET | Freq: Every day | ORAL | Status: DC
  Administered 2015-11-05: 1 mg via ORAL
  Filled 2015-11-05: qty 1

## 2015-11-05 MED ORDER — IBUPROFEN 600 MG PO TABS: 600.0000 mg | ORAL_TABLET | Freq: Three times a day (TID) | ORAL | Status: DC | PRN

## 2015-11-05 MED ORDER — ACETAMINOPHEN 325 MG PO TABS: 650.0000 mg | ORAL_TABLET | Freq: Four times a day (QID) | ORAL | Status: DC | PRN

## 2015-11-05 MED ORDER — BUPROPION HCL 100 MG PO TABS: 100.0000 mg | ORAL_TABLET | Freq: Every day | ORAL | Status: DC

## 2015-11-05 NOTE — Progress Notes (Signed)
Patient with depressed affect, cooperative behavior with meds and plan of care. Alert and oriented x 4.  No SI/HI at this time. Refuses am meal. Taking po fluids.  Patient reports she "is really tired this am". Takes am med and returns back to bed to sleep. Husband phones (without security code) to speak with wife. Writer unable to give husband info but patient is aware her family will like to speak with her and patient to call family at 12 pm. Safety maintained.
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
## 2015-11-05 NOTE — ED Notes (Signed)
BEHAVIORAL HEALTH ROUNDING  Patient sleeping: No.  Patient alert and oriented: yes  Behavior appropriate: Yes. ; If no, describe: calmer at this time.  Nutrition and fluids offered: Yes  Toileting and hygiene offered: Yes  Sitter present: not applicable, Q 15 min safety rounds and observation via security camera. Law enforcement present: Yes ODS
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
## 2015-11-05 NOTE — ED Notes (Signed)
Pt remains standing on bed. Pt noted to be rubbing her wrist over the corner of the light fixture. This RN and ODS officer in to speak with pt and will move pt to another room that does not have reachable light fixture. Pt asked to walk to another room. Pt refusing. Pt yelling at staff stating that "that nurse is a bitch, she will not give me my medication. I take the medication 4 times a day at home and I cannot sleep with out it." Once again explained to the pt that the doctor had only ordered the medication once a day at bedtime and to start tomorrow night. Pt continues to argue with the staff. Continues to refuse to walk to new room. Explained to pt that she would have to walk to the new room or she would be moved.

## 2015-11-05 NOTE — Plan of Care (Signed)
Problem: Ineffective individual coping Goal: STG: Patient will remain free from self harm Outcome: Progressing Pt remains free from self-harm and denies SI.     

## 2015-11-05 NOTE — ED Notes (Signed)
Report called to Franky Macho, RN on [facility].

## 2015-11-05 NOTE — Progress Notes (Signed)
Patient denies SI/HI at this time. MD and social worker aware patient to discharge to husband. Patient states she is ready to discharge. Patient states she has an Education officer, community and her family for positive support, plus her therapist and doctor. Safety maintained.

## 2015-11-05 NOTE — Progress Notes (Signed)
Patient has not complete self inventory sheet by this time.

## 2015-11-05 NOTE — H&P (Signed)
Psychiatric Admission Assessment Adult  Patient Identification: Erica Larson MRN:  [id number] Date of Evaluation:  11/05/2015 Chief Complaint:  major depression Principal Diagnosis: <principal problem not specified> Diagnosis:   Patient Active Problem List   Diagnosis Date Noted  . Bipolar 2 disorder, major depressive episode ([place]) [F31.81] 11/04/2015  . Anxiety state, unspecified [F41.1] 09/27/2013  . Fatigue [R53.83] 01/02/2011  . Leg cramps [R25.2] 01/02/2011  . VITAMIN D DEFICIENCY [E55.9] 01/05/2009  . Hypercalcemia [E83.52] 01/05/2009  . NUMBNESS [R20.9] 01/05/2009  . THYROIDITIS [E06.9] 01/02/2009  . PALPITATIONS, RECURRENT [R00.2] 01/02/2009  . SINUSITIS- ACUTE-NOS [J01.90] 08/15/2008  . FEMALE INFERTILITY [N97.9] 07/18/2008  . WEIGHT GAIN [R63.5] 07/18/2008  . ACUTE TONSILLITIS [J03.90] 12/30/2007  . URI [J06.9] 12/30/2007  . MIGRAINE HEADACHE [G43.909] 08/12/2007  . AMENORRHEA [N91.2] 08/12/2007   History of Present Illness:  Identifying data. Erica Larson is a 39 year old female with history of depression and mood instability.  Chief complaint. "I've had an argument with my husband."  History of present illness. Information was obtained from the patient and the chart. The patient has a long history of depression. She has been maintained on a combination of Celexa, Wellbutrin, and low-dose clonazepam with excellent results. She has a psychiatrist and therapist whom she sees weekly. She was admitted to our hospital after an overdose on multiple medications including Celexa, Ambien, clonazepam, and Requip. She also superficially cut her wrists. She has been under considerable stress due to difficulties in her marriage. She and her husband has been arguing about the money. He feels that she spent all his money that he made in [place]. They are about to separate. The patient was arguing with her husband and took an overdose. She reports some symptoms of depression but they were  adequately controlled with medicine and psychotherapy. She regrets her affect. She is very remorseful. She has a 68 year old daughter means the world to her. She is no longer suicidal. She decided to move in with her uncle who is very supportive while she is trying to get separated from her husband. She denies excessive anxiety. She does report mood swings and periods of time when she spends too much money, is full of energy, talkative, insomnia, too happy for the situation. She denies psychotic symptoms. She denies alcohol or illicit substance use.  Past psychiatric history. She was briefly hospitalized one time at the age of 81 due to conflict with her current husband. She denies attempting suicide before. She complains of sexual side effects from Celexa.  Family psychiatric history. Grandmother with anxiety. Father with substance abuse problems.  Social history. She is married. She lives with her husband. 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 Her relationship with her husband is strained. They've tried couples therapy. They will most likely get a divorce. She has a 61 year old daughter who  already moved out of the house and stays with her uncle.  Total Time spent with patient: 1 hour  Past Psychiatric History:Depression.  Risk to Self: Is patient at risk for suicide?: Yes Risk to Others:   Prior Inpatient Therapy:   Prior Outpatient Therapy:    Alcohol Screening: 1. How often do you have a drink containing alcohol?: Never 9. Have you or someone else been injured as a result of your drinking?: No 10. Has a relative or friend or a doctor or another health worker been concerned about your drinking or suggested you cut down?: No Alcohol Use Disorder  Identification Test Final Score (AUDIT): 0 Brief Intervention: AUDIT score less than 7 or less-screening does not suggest unhealthy drinking-brief intervention not indicated Substance Abuse History in the  last 12 months:  No. Consequences of Substance Abuse: NA Previous Psychotropic Medications: Yes  Psychological Evaluations: No  Past Medical History:  Past Medical History  Diagnosis Date  . Infertility, female     treatment with oligammenorhea  Dr Carren Rang   . Thyroiditis   . Acne   . Amenorrhea   . Migraine   . Restless legs syndrome     Past Surgical History  Procedure Laterality Date  . Dilation and curettage of uterus     Family History:  Family History  Problem Relation Age of Onset  . Thyroid disease Mother   . Nephrolithiasis Mother   . Urolithiasis Mother   . Heart attack Mother     signs but neg eval  . Nephrolithiasis Father   . Urolithiasis Father   . Anxiety disorder Father   . Drug abuse Father   . Thyroid disease Sister   . Heart disease Maternal Grandmother    Family Psychiatric  HistoryGrandmother with anxiety, father with substance abuse.  Social History:  History  Alcohol Use No     History  Drug Use No    Social History   Social History  . Marital Status: Married    Spouse Name: N/A  . Number of Children: N/A  . Years of Education: N/A   Social History Main Topics  . Smoking status: Never Smoker   . Smokeless tobacco: None  . Alcohol Use: No  . Drug Use: No  . Sexual Activity: Yes    Birth Control/ Protection: Inserts, IUD   Other Topics Concern  . None   Social History Narrative   hhof 3 married non smoker  Daughter husband and Medical sales representative Social History:                         Allergies:  No Known Allergies Lab Results:  Results for orders placed or performed during the hospital encounter of 11/04/15 (from the past 48 hour(s))  Comprehensive metabolic panel     Status: Abnormal   Collection Time: 11/03/15 11:58 PM  Result Value Ref Range   Sodium 137 135 - 145 mmol/L   Potassium 3.6 3.5 - 5.1 mmol/L   Chloride 106 101 - 111 mmol/L   CO2 25 22 - 32 mmol/L   Glucose, Bld 115 (H) 65 - 99 mg/dL   BUN 18  6 - 20 mg/dL   Creatinine, Ser 0.73 0.44 - 1.00 mg/dL   Calcium 10.9 (H) 8.9 - 10.3 mg/dL   Total Protein 8.4 (H) 6.5 - 8.1 g/dL   Albumin 4.6 3.5 - 5.0 g/dL   AST 33 15 - 41 U/L   ALT 39 14 - 54 U/L   Alkaline Phosphatase 71 38 - 126 U/L   Total Bilirubin 0.5 0.3 - 1.2 mg/dL   GFR calc non Af Amer >60 >60 mL/min   GFR calc Af Amer >60 >60 mL/min    Comment: (NOTE) The eGFR has been calculated using the CKD EPI equation. This calculation has not been validated in all clinical situations. eGFR's persistently <60 mL/min signify possible Chronic Kidney Disease.    Anion gap 6 5 - 15  Ethanol (ETOH)     Status: None   Collection Time: 11/03/15 11:58 PM  Result Value Ref Range  Alcohol, Ethyl (B) <5 <5 mg/dL    Comment:        LOWEST DETECTABLE LIMIT FOR SERUM ALCOHOL IS 5 mg/dL FOR MEDICAL PURPOSES ONLY   Salicylate level     Status: None   Collection Time: 11/03/15 11:58 PM  Result Value Ref Range   Salicylate Lvl <5.9 2.8 - 30.0 mg/dL  Acetaminophen level     Status: Abnormal   Collection Time: 11/03/15 11:58 PM  Result Value Ref Range   Acetaminophen (Tylenol), Serum <10 (L) 10 - 30 ug/mL    Comment:        THERAPEUTIC CONCENTRATIONS VARY SIGNIFICANTLY. A RANGE OF 10-30 ug/mL MAY BE AN EFFECTIVE CONCENTRATION FOR MANY PATIENTS. HOWEVER, SOME ARE BEST TREATED AT CONCENTRATIONS OUTSIDE THIS RANGE. ACETAMINOPHEN CONCENTRATIONS >150 ug/mL AT 4 HOURS AFTER INGESTION AND >50 ug/mL AT 12 HOURS AFTER INGESTION ARE OFTEN ASSOCIATED WITH TOXIC REACTIONS.   CBC     Status: None   Collection Time: 11/03/15 11:58 PM  Result Value Ref Range   WBC 7.7 3.6 - 11.0 K/uL   RBC 4.75 3.80 - 5.20 MIL/uL   Hemoglobin 13.8 12.0 - 16.0 g/dL   HCT 41.4 35.0 - 47.0 %   MCV 87.2 80.0 - 100.0 fL   MCH 29.1 26.0 - 34.0 pg   MCHC 33.4 32.0 - 36.0 g/dL   RDW 13.3 11.5 - 14.5 %   Platelets 301 150 - 440 K/uL  Acetaminophen level     Status: Abnormal   Collection Time: 11/04/15  3:00 AM   Result Value Ref Range   Acetaminophen (Tylenol), Serum <10 (L) 10 - 30 ug/mL    Comment:        THERAPEUTIC CONCENTRATIONS VARY SIGNIFICANTLY. A RANGE OF 10-30 ug/mL MAY BE AN EFFECTIVE CONCENTRATION FOR MANY PATIENTS. HOWEVER, SOME ARE BEST TREATED AT CONCENTRATIONS OUTSIDE THIS RANGE. ACETAMINOPHEN CONCENTRATIONS >150 ug/mL AT 4 HOURS AFTER INGESTION AND >50 ug/mL AT 12 HOURS AFTER INGESTION ARE OFTEN ASSOCIATED WITH TOXIC REACTIONS.   Urine Drug Screen, Qualitative ([place] only)     Status: Abnormal   Collection Time: 11/04/15  3:41 AM  Result Value Ref Range   Tricyclic, Ur Screen NONE DETECTED NONE DETECTED   Amphetamines, Ur Screen NONE DETECTED NONE DETECTED   MDMA (Ecstasy)Ur Screen NONE DETECTED NONE DETECTED   Cocaine Metabolite,Ur [place] NONE DETECTED NONE DETECTED   Opiate, Ur Screen NONE DETECTED NONE DETECTED   Phencyclidine (PCP) Ur S NONE DETECTED NONE DETECTED   Cannabinoid 50 Ng, Ur [place] NONE DETECTED NONE DETECTED   Barbiturates, Ur Screen NONE DETECTED NONE DETECTED   Benzodiazepine, Ur Scrn POSITIVE (A) NONE DETECTED   Methadone Scn, Ur NONE DETECTED NONE DETECTED    Comment: (NOTE) 935  Tricyclics, urine               Cutoff 1000 ng/mL 200  Amphetamines, urine             Cutoff 1000 ng/mL 300  MDMA (Ecstasy), urine           Cutoff 500 ng/mL 400  Cocaine Metabolite, urine       Cutoff 300 ng/mL 500  Opiate, urine                   Cutoff 300 ng/mL 600  Phencyclidine (PCP), urine      Cutoff 25 ng/mL 700  Cannabinoid, urine              Cutoff 50 ng/mL 800  Barbiturates,  urine             Cutoff 200 ng/mL 900  Benzodiazepine, urine           Cutoff 200 ng/mL 1000 Methadone, urine                Cutoff 300 ng/mL 1100 1200 The urine drug screen provides only a preliminary, unconfirmed 1300 analytical test result and should not be used for non-medical 1400 purposes. Clinical consideration and professional judgment should 1500 be applied to any positive drug  screen result due to possible 1600 interfering substances. A more specific alternate chemical method 1700 must be used in order to obtain a confirmed analytical result.  1800 Gas chromato graphy / mass spectrometry (GC/MS) is the preferred 1900 confirmatory method.   Pregnancy, urine POC     Status: None   Collection Time: 11/04/15  3:47 AM  Result Value Ref Range   Preg Test, Ur NEGATIVE NEGATIVE    Comment:        THE SENSITIVITY OF THIS METHODOLOGY IS >24 mIU/mL     Metabolic Disorder Labs:  No results found for: HGBA1C, MPG No results found for: PROLACTIN No results found for: CHOL, TRIG, HDL, CHOLHDL, VLDL, LDLCALC  Current Medications: Current Facility-Administered Medications  Medication Dose Route Frequency Provider Last Rate Last Dose  . acetaminophen (TYLENOL) tablet 650 mg  650 mg Oral Q6H PRN Donita Brooks, MD      . alum & mag hydroxide-simeth (MAALOX/MYLANTA) 200-200-20 MG/5ML suspension 30 mL  30 mL Oral Q4H PRN Donita Brooks, MD      . buPROPion [facility]) tablet 100 mg  100 mg Oral q morning - 10a Yechiel Erny B Paitynn Mikus, MD      . ibuprofen (ADVIL,MOTRIN) tablet 600 mg  600 mg Oral TID PRN Donita Brooks, MD      . levothyroxine (SYNTHROID, LEVOTHROID) tablet 50 mcg  50 mcg Oral QAC breakfast Donita Brooks, MD   50 mcg at 11/05/15 [phone]  . [START ON 11/06/2015] lurasidone (LATUDA) tablet 40 mg  40 mg Oral Q breakfast Evelen Vazguez B Rita Prom, MD      . magnesium hydroxide (MILK OF MAGNESIA) suspension 30 mL  30 mL Oral Daily PRN Donita Brooks, MD      . rOPINIRole (REQUIP) tablet 1 mg  1 mg Oral TID Ermal Brzozowski B Shanon Becvar, MD      . traZODone (DESYREL) tablet 50 mg  50 mg Oral QHS PRN Donita Brooks, MD   50 mg at 11/05/15 0150   PTA Medications: Prescriptions prior to admission  Medication Sig Dispense Refill Last Dose  . buPROPion (WELLBUTRIN) 100 MG tablet Take 1 tablet (100 mg total) by mouth daily. 30 tablet 2 11/04/2015 at Unknown time  . clonazePAM  (KLONOPIN) 0.5 MG tablet Take one per day as needed for anxiety 60 tablet 2 11/04/2015 at Unknown time  . escitalopram (LEXAPRO) 20 MG tablet Take 1 tablet (20 mg total) by mouth daily. 30 tablet 2 11/04/2015 at Unknown time  . ibuprofen (ADVIL,MOTRIN) 600 MG tablet Take 1 tablet (600 mg total) by mouth every 6 (six) hours as needed. 30 tablet 0 Past Week at Unknown time  . levothyroxine (SYNTHROID, LEVOTHROID) 25 MCG tablet Take 50 mcg by mouth.    11/04/2015 at Unknown time  . PHENTERMINE HCL PO Take 18.75 mg by mouth daily.   11/04/2015 at Unknown time  . rOPINIRole (REQUIP) 1 MG tablet Take 1 mg by mouth at bedtime.  11/04/2015 at Unknown time    Musculoskeletal: Strength & Muscle Tone: within normal limits Gait & Station: normal Patient leans: N/A  Psychiatric Specialty Exam: Physical Exam  Nursing note and vitals reviewed.   Review of Systems  Psychiatric/Behavioral: The patient has insomnia.   All other systems reviewed and are negative.   Blood pressure 81/53, pulse 52, temperature 97.9 F (36.6 C), temperature source Oral, resp. rate 20, height 5' 6"  (1.676 m), weight 76.204 kg (168 lb), SpO2 97 %.Body mass index is 27.13 kg/(m^2).  See SRA.                                                  Sleep:  Number of Hours: 2.25     Treatment Plan Summary: Daily contact with patient to assess and evaluate symptoms and progress in treatment and Medication management   Ms. Kimm is a 39 year old female with history of depression admitted after suicide attempt by medication overdose.  1. Suicidal ideation. This has resolved. The patient denies any thoughts intentions or plans of hurting herself or others. She is able to contract for safety.  2. Mood. The patient has been maintained on a combination of Celexa and Wellbutrin. She no longer wants to take Celexa as it causes sexual side effects. As the patient is diagnosed with bipolar illness we will start [place]  for mood stabilization.  3. Restless leg syndrome. We'll continue Requip.  4. Hypothyroidism. We'll continue Synthroid.  5. Insomnia. We'll offer trazodone.  6. Disposition. She will be discharged to home with her uncle. She will follow up with her regular psychiatrist Dr. Harrington Challenger and her therapist.   Observation Level/Precautions:  15 minute checks  Laboratory:  CBC Chemistry Profile UDS UA  Psychotherapy:    Medications:    Consultations:    Discharge Concerns:    Estimated LOS:  Other:     I certify that inpatient services furnished can reasonably be expected to improve the patient's condition.   Christop Hippert 12/18/201611:10 AM

## 2015-11-05 NOTE — [facility] Suicide Risk Assessment (Addendum)
[facility] Admission Suicide Risk Assessment   Nursing information obtained from:  Patient Demographic factors:  Caucasian Current Mental Status:  NA (Denies SI currently) Loss Factors:  Financial problems / change in socioeconomic status Historical Factors:  Impulsivity Risk Reduction Factors:  Responsible for children under 38 years of age, Sense of responsibility to family, Living with another person, especially a relative, Positive social support, Positive therapeutic relationship Total Time spent with patient: 1 hour Principal Problem: <principal problem not specified> Diagnosis:   Patient Active Problem List   Diagnosis Date Noted  . Bipolar 2 disorder, major depressive episode ([facility]) [F31.81] 11/04/2015  . Anxiety state, unspecified [F41.1] 09/27/2013  . Fatigue [R53.83] 01/02/2011  . Leg cramps [R25.2] 01/02/2011  . VITAMIN D DEFICIENCY [E55.9] 01/05/2009  . Hypercalcemia [E83.52] 01/05/2009  . NUMBNESS [R20.9] 01/05/2009  . THYROIDITIS [E06.9] 01/02/2009  . PALPITATIONS, RECURRENT [R00.2] 01/02/2009  . SINUSITIS- ACUTE-NOS [J01.90] 08/15/2008  . FEMALE INFERTILITY [N97.9] 07/18/2008  . WEIGHT GAIN [R63.5] 07/18/2008  . ACUTE TONSILLITIS [J03.90] 12/30/2007  . URI [J06.9] 12/30/2007  . MIGRAINE HEADACHE [G43.909] 08/12/2007  . AMENORRHEA [N91.2] 08/12/2007     Continued Clinical Symptoms:  Alcohol Use Disorder Identification Test Final Score (AUDIT): 0 The "Alcohol Use Disorders Identification Test", Guidelines for Use in Primary Care, Second Edition.  World Science writer [facility]). Score between 0-7:  no or low risk or alcohol related problems. Score between 8-15:  moderate risk of alcohol related problems. Score between 16-19:  high risk of alcohol related problems. Score 20 or above:  warrants further diagnostic evaluation for alcohol dependence and treatment.   CLINICAL FACTORS:   Bipolar Disorder:   Bipolar II Depression:   Insomnia   Musculoskeletal: Strength &  Muscle Tone: within normal limits Gait & Station: normal Patient leans: N/A  Psychiatric Specialty Exam: I reviewed physical exam performed in the emergency room and agree with the findings. Physical Exam  Nursing note and vitals reviewed.   Review of Systems  Psychiatric/Behavioral: The patient has insomnia.   All other systems reviewed and are negative.   Blood pressure 81/53, pulse 52, temperature 97.9 F (36.6 C), temperature source Oral, resp. rate 20, height 5\' 6"  (1.676 m), weight 76.204 kg (168 lb), SpO2 97 %.Body mass index is 27.13 kg/(m^2).  General Appearance: Casual  Eye Contact::  Good  Speech:  Clear and Coherent  Volume:  Normal  Mood:  Euthymic  Affect:  Appropriate  Thought Process:  Goal Directed  Orientation:  Full (Time, Place, and Person)  Thought Content:  WDL  Suicidal Thoughts:  No  Homicidal Thoughts:  No  Memory:  Immediate;   Fair Recent;   Fair Remote;   Fair  Judgement:  Fair  Insight:  Fair  Psychomotor Activity:  Normal  Concentration:  Fair  Recall:  [employer] of Knowledge:Fair  Language: Fair  Akathisia:  No  Handed:  Right  AIMS (if indicated):     Assets:  Communication Skills Desire for Improvement Financial Resources/Insurance Housing Physical Health Resilience Social Support  Sleep:  Number of Hours: 2.25  Cognition: WNL  ADL's:  Intact     COGNITIVE FEATURES THAT CONTRIBUTE TO RISK:  None    SUICIDE RISK:   Minimal: No identifiable suicidal ideation.  Patients presenting with no risk factors but with morbid ruminations; may be classified as minimal risk based on the severity of the depressive symptoms  PLAN OF CARE: Hospital admission, medication management, discharge planning.  Medical Decision Making:  New problem, with  additional work up planned, Review of Psycho-Social Stressors (1), Review or order clinical lab tests (1), Review of Medication Regimen & Side Effects (2) and Review of New Medication or Change in  Dosage (2)    Ms. Taite is a 39 year old female with history of depression admitted after suicide attempt by medication overdose.  1. Suicidal ideation. This has resolved. The patient denies any thoughts intentions or plans of hurting herself or others. She is able to contract for safety.  2. Mood. The patient has been maintained on a combination of Celexa and Wellbutrin. She no longer wants to take Celexa as it causes sexual side effects. As the patient is diagnosed with bipolar illness we will start Latuda for mood stabilization.  3. Restless leg syndrome. We'll continue Requip.  4. Hypothyroidism. We'll continue Synthroid.  5. Insomnia. We'll offer trazodone.  6. Disposition. She will be discharged to home with her uncle. She will follow up with her regular psychiatrist Dr. Tenny Craw and her therapist.  I certify that inpatient services furnished can reasonably be expected to improve the patient's condition.   Allyna Pittsley 11/05/2015, 10:45 AM

## 2015-11-05 NOTE — [facility] Suicide Risk Assessment (Signed)
[facility] Discharge Suicide Risk Assessment   Demographic Factors:  Caucasian  Total Time spent with patient: 1 hour  Musculoskeletal: Strength & Muscle Tone: within normal limits Gait & Station: normal Patient leans: N/A  Psychiatric Specialty Exam: Physical Exam  Nursing note and vitals reviewed.   Review of Systems  Psychiatric/Behavioral: The patient has insomnia.   All other systems reviewed and are negative.   Blood pressure 81/53, pulse 52, temperature 97.9 F (36.6 C), temperature source Oral, resp. rate 20, height  (1.676 m), weight 76.204 kg (168 lb), SpO2 97 %.Body mass index is 27.13 kg/(m^2).  See SRA.                                                     Have you used any form of tobacco in the last 30 days? (Cigarettes, Smokeless Tobacco, Cigars, and/or Pipes): No  Has this patient used any form of tobacco in the last 30 days? (Cigarettes, Smokeless Tobacco, Cigars, and/or Pipes) No  Mental Status Per Nursing Assessment::   On Admission:  NA (Denies SI currently)  Current Mental Status by Physician: NA  Loss Factors: Loss of significant relationship  Historical Factors: Family history of mental illness or substance abuse and Impulsivity  Risk Reduction Factors:   Responsible for children under 58 years of age, Sense of responsibility to family, Religious beliefs about death, Employed, Living with another person, especially a relative, Positive social support and Positive therapeutic relationship  Continued Clinical Symptoms:  Bipolar Disorder:   Bipolar II  Cognitive Features That Contribute To Risk:  None    Suicide Risk:  Minimal: No identifiable suicidal ideation.  Patients presenting with no risk factors but with morbid ruminations; may be classified as minimal risk based on the severity of the depressive symptoms  Principal Problem: <principal problem not specified> Discharge Diagnoses:  Patient Active Problem List   Diagnosis Date Noted  . Bipolar 2 disorder, major depressive episode ([facility]) [F31.81] 11/04/2015  . Anxiety state, unspecified [F41.1] 09/27/2013  . Fatigue [R53.83] 01/02/2011  . Leg cramps [R25.2] 01/02/2011  . VITAMIN D DEFICIENCY [E55.9] 01/05/2009  . Hypercalcemia [E83.52] 01/05/2009  . NUMBNESS [R20.9] 01/05/2009  . THYROIDITIS [E06.9] 01/02/2009  . PALPITATIONS, RECURRENT [R00.2] 01/02/2009  . SINUSITIS- ACUTE-NOS [J01.90] 08/15/2008  . FEMALE INFERTILITY [N97.9] 07/18/2008  . WEIGHT GAIN [R63.5] 07/18/2008  . ACUTE TONSILLITIS [J03.90] 12/30/2007  . URI [J06.9] 12/30/2007  . MIGRAINE HEADACHE [G43.909] 08/12/2007  . AMENORRHEA [N91.2] 08/12/2007      Plan Of Care/Follow-up recommendations:  Activity:  As tolerated. Diet:  Low sodium heart healthy. Other:  Keep follow-up appointments.  Is patient on multiple antipsychotic therapies at discharge:  No   Has Patient had three or more failed trials of antipsychotic monotherapy by history:  No  Recommended Plan for Multiple Antipsychotic Therapies: NA    Erica Larson 11/05/2015, 12:03 PM

## 2015-11-05 NOTE — Progress Notes (Signed)
Patient discharge at this to Uncle, Husband and at least 4 other female family present present. Verbalize understanding rt recommended discharge plan of care. Acknowledges that all personal belongings have been returned. Safety maintained.

## 2015-11-05 NOTE — [facility] Suicide Risk Assessment (Signed)
[facility] INPATIENT:  Family/Significant Other Suicide Prevention Education  Suicide Prevention Education:  Contact Attempts:Darrel Rolm Baptise (uncle) [phone] has been identified by the patient as the family member/significant other with whom the patient will be residing, and identified as the person(s) who will aid the patient in the event of a mental health crisis.  With written consent from the patient, two attempts were made to provide suicide prevention education, prior to and/or following the patient's discharge.  We were unsuccessful in providing suicide prevention education.  A suicide education pamphlet was given to the patient to share with family/significant other.  Date and time of first attempt:11/05/2015 /1:11 PM  Date and time of second attempt:11/05/2015/ 3:11 PM   Mitchelle Sultan L Samiksha Pellicano MSW, LCSWA  11/05/2015, 1:10 PM

## 2015-11-05 NOTE — ED Notes (Signed)
[employer] Officer Isley in to talk with pt and attempt to get her to move to the new room. Pt continues to refuse.

## 2015-11-05 NOTE — ED Notes (Signed)
Dr York Cerise called for orders to help pt calm down. New orders received.

## 2015-11-05 NOTE — ED Notes (Signed)
Pt agreeable to have injection given. [employer] officers in room for safety while IM given.

## 2015-11-05 NOTE — [facility] Group Notes (Signed)
[facility] LCSW Group Therapy  11/05/2015 3:08 PM  Type of Therapy:  Group Therapy  Participation Level:  Did Not Attend  Modes of Intervention:  Discussion, Education, Socialization and Support  Summary of Progress/Problems: Pt will identify unhealthy thoughts and how they impact their emotions and behavior. Pt will be encouraged to discuss these thoughts, emotions and behaviors with the group.   Tarry Fountain L Halsey Hammen MSW, LCSWA  11/05/2015, 3:08 PM 

## 2015-11-05 NOTE — ED Notes (Signed)
Pt picked up off the bed by [employer] Officer Ernest Mallick, ODS officer Orson Aloe and [employer]. Move observed by this RN and Newman Nickels, RN and ODS Sgt. McAdoo. Pt moved without incident or injury. Pt placed on bed in Rm 6 and given her covers and pillow. Pt threw covers at this RN and slung her pillow at [facility]. Pillow and covers removed from the room and pt encouraged to calm down. Officer Isley attempting to calm pt.

## 2015-11-05 NOTE — Tx Team (Signed)
Initial Interdisciplinary Treatment Plan   PATIENT STRESSORS: Financial difficulties Marital or family conflict   PATIENT STRENGTHS: Ability for insight Average or above average intelligence Communication skills Motivation for treatment/growth Physical Health Supportive family/friends   PROBLEM LIST: Problem List/Patient Goals Date to be addressed Date deferred Reason deferred Estimated date of resolution  Suicide Attempt: Overdose      Depression      Anxiety      "Being able to cope with stress better"                                     DISCHARGE CRITERIA:  Adequate post-discharge living arrangements Improved stabilization in mood, thinking, and/or behavior  PRELIMINARY DISCHARGE PLAN: Outpatient therapy Participate in family therapy  PATIENT/FAMIILY INVOLVEMENT: This treatment plan has been presented to and reviewed with the patient, Erica Larson.  The patient and family have been given the opportunity to ask questions and make suggestions.  Jonette Mate Prentice Sackrider 11/05/2015, 2:44 AM

## 2015-11-05 NOTE — Progress Notes (Signed)
Patient ID: Erica Larson, female   DOB: Mar 19, 1976, 39 y.o.   MRN: [id number]  PSA was not complete. Pt discharged within 24 hours of admission. Pt plans to stay with her uncle and grandmother upon discharge.   Rondall Allegra, MSW, LCSWA  11/05/2015 1:06 PM

## 2015-11-05 NOTE — Progress Notes (Signed)
Refuses lunch and speaks with husband on the phone. Patient to discharge today when discharge plan and transportation in place. Safety maintained.

## 2015-11-05 NOTE — Progress Notes (Signed)
  [facility] Adult Case Management Discharge Plan :  Will you be returning to the same living situation after discharge:  No. At discharge, do you have transportation home?: Yes,  family Do you have the ability to pay for your medications: Yes,  insurance  Release of information consent forms completed and in the chart;  Patient's signature needed at discharge.  Patient to Follow up at: Follow-up Information    Follow up with [facility] . Schedule an appointment as soon as possible for a visit in 1 day.   Why:  Please call to schedule an appointment as soon as possible for a hospital follow up.    Contact information:    [address],  [place], [place] [address] Phone: [phone]      Next level of care provider has access to [facility]:no  Safety Planning and Suicide Prevention discussed: Yes,  with patient   Have you used any form of tobacco in the last 30 days? (Cigarettes, Smokeless Tobacco, Cigars, and/or Pipes): No  Has patient been referred to the Quitline?: N/A patient is not a smoker  Patient has been referred for addiction treatment: N/A  Rondall Allegra MSW, LCSWA  11/05/2015, 1:09 PM

## 2015-11-06 ENCOUNTER — Telehealth (HOSPITAL_COMMUNITY): Payer: Self-pay | Admitting: *Deleted

## 2015-11-06 ENCOUNTER — Encounter (HOSPITAL_COMMUNITY): Payer: Self-pay | Admitting: Psychiatry

## 2015-11-06 ENCOUNTER — Encounter (HOSPITAL_COMMUNITY): Payer: Self-pay | Admitting: *Deleted

## 2015-11-06 ENCOUNTER — Ambulatory Visit (INDEPENDENT_AMBULATORY_CARE_PROVIDER_SITE_OTHER): Payer: BLUE CROSS/BLUE SHIELD | Admitting: Psychiatry

## 2015-11-06 DIAGNOSIS — F329 Major depressive disorder, single episode, unspecified: Secondary | ICD-10-CM

## 2015-11-06 DIAGNOSIS — F32A Depression, unspecified: Secondary | ICD-10-CM

## 2015-11-06 NOTE — Telephone Encounter (Signed)
Per Ines Bloomer to send auth to [place]. Pt is aware and shows understanding.

## 2015-11-06 NOTE — Telephone Encounter (Signed)
phone call from patient who said while she was inpatient, she was prescribed Latuda and it needs prior authorization.  She said the doctor that prescribed this medicine is gone for a week.  She needs authorization for this medication.   She has an appointment with Gigi Gin today, but says there is no point in seeing Peggy since she can't prescribe meds.

## 2015-11-06 NOTE — Progress Notes (Signed)
Per Shawn, Dr. Rutherford Limerick approved that office give pt 2 wks worth of Latuda 40 mg samples while Dr. Tenny Craw is out of office and while office is waiting for Prior Auth. LOT number is [id number] ex 2-20. Pt D/L number is [id number]. With expiration date of 05-22-2023.

## 2015-11-06 NOTE — Patient Instructions (Signed)
Discussed orally 
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
## 2015-11-06 NOTE — Telephone Encounter (Signed)
Pt called office stating she would like to see if office could do her prior auth for her Kasandra Knudsen that was given to her by Inpt. Per pt, the doctor that gived her the script in inpt is going to be out of the office for a week. Called Everlene Balls and he stated that office should call [facility] and see if they could send the prior auth request to office for office to send request to the person that helps with the prior auth. Called [facility] and was told that pt Jordan script was not given to them due to them not having script on file for pt. Called pt and asked her where was the printed script and per pt, inpt doctor informed her to take it to the [facility] in [place] due to it being the only pharmacy open at the time of her discharge. Called [facility] in [place] off [address] and they stated they will fax request to office. Office did receive authorization and it was sent to the lady that helps office with it.

## 2015-11-07 ENCOUNTER — Telehealth (HOSPITAL_COMMUNITY): Payer: Self-pay | Admitting: *Deleted

## 2015-11-07 NOTE — Telephone Encounter (Signed)
Prior authorization received for Latuda.

## 2015-11-07 NOTE — Telephone Encounter (Signed)
Pt called stating she did not want to take the Latuda this early due to it making her sleeping and do not wanting to sleep the rest of the day. Per pt, she just need something that will knock off the edge a little today. Per pt, she would like to see if office could call in a script for an extra Klonopin to help her. Per pt, she is ok with even taking something else to work with the Jordan. Per pt, she took the Wellbutrin and it's not working. Pt number is [phone].

## 2015-11-08 NOTE — Telephone Encounter (Signed)
Prior authorization for Latuda received from office. Submitted online with cover my meds. Awaiting decision to be faxed.

## 2015-11-08 NOTE — Telephone Encounter (Signed)
Met with Dr. Lovena Le to discuss patient's calls and recent inpatient.  Message left for patient that Dr. Lovena Le suggested patient change Latuda to taking around dinner time with some food to see if would help stop daytime drowsiness, that he was not willing to change any other medications at this time since she was just released on these medications from inpatient and if any further concerns to follow up with Dr. Harrington Challenger upon her return to the [place] office in the coming week. Requested patient call back if any questions.

## 2015-11-09 ENCOUNTER — Telehealth (HOSPITAL_COMMUNITY): Payer: Self-pay | Admitting: *Deleted

## 2015-11-15 ENCOUNTER — Encounter (HOSPITAL_COMMUNITY): Payer: Self-pay | Admitting: Psychiatry

## 2015-11-15 ENCOUNTER — Ambulatory Visit (INDEPENDENT_AMBULATORY_CARE_PROVIDER_SITE_OTHER): Payer: No Typology Code available for payment source | Admitting: Psychiatry

## 2015-11-15 DIAGNOSIS — F329 Major depressive disorder, single episode, unspecified: Secondary | ICD-10-CM | POA: Diagnosis not present

## 2015-11-15 DIAGNOSIS — F32A Depression, unspecified: Secondary | ICD-10-CM

## 2015-11-15 NOTE — Progress Notes (Signed)
       THERAPIST PROGRESS NOTE  Session Time:     Wednesday 11/15/2015  9:10 AM -  10:00 AM      Participation Level: Active  Behavioral Response: CasualAlert/anxious/depressed  Type of Therapy: Individual Therapy  Treatment Goals addressed:  Increase self acceptance  Improve assertiveness skills and ability to set and maintain boundaries  Improve mood and decrease anxiety response    Interventions: Supportive  Summary: Erica Larson is a 39 y.o. female who presents with a history of anxiety and anger outbursts that have been intermittent for several years. Patient reports often feeling enraged and out of control. She reports significant marital stress and trust issues in her marriage.   Patient reports more stable mood since last session. She still experiences nausea with Latuda. She denies having any suicidal ideations since last session. She and husband continue to experience marital conflict. She has resided primarily at her uncle's house but stayed at her home with her daughter and husband for three nights since last session. She reports continuing to received mixed messages from husband about remaining in the marriage. She reports ambivalent feelings about remaining in the marriage. She says she still loves husband but he always blames her for everything. She said she has talked with husband about possibility of returning to marriage counseling but he refuses and says he's not going until she gets fixed first per patient's report.   Suicidal/Homicidal: No. Patient agrees to call 911 or have someone take her to the ER should symptoms worsen.   Therapist Response: Therapist works with patient to review symptoms, process feelings, encourage patient to maintain self-care, discuss possibility of pursuing marriage counseling and ways to communicate with husband about this  Plan: Return again in 2 weeks  Diagnosis: Axis I: Anxiety Disorder NOS and Mood Disorder NOS    Axis II:  Deferred    Lanessa Shill, LCSW 11/15/2015

## 2015-11-15 NOTE — Patient Instructions (Signed)
Discussed orally 

## 2015-11-17 ENCOUNTER — Ambulatory Visit (INDEPENDENT_AMBULATORY_CARE_PROVIDER_SITE_OTHER): Payer: BLUE CROSS/BLUE SHIELD | Admitting: Psychiatry

## 2015-11-17 ENCOUNTER — Encounter (HOSPITAL_COMMUNITY): Payer: Self-pay | Admitting: Psychiatry

## 2015-11-17 VITALS — BP 98/73 | HR 83 | Ht 66.0 in | Wt 173.0 lb

## 2015-11-17 DIAGNOSIS — F329 Major depressive disorder, single episode, unspecified: Secondary | ICD-10-CM

## 2015-11-17 DIAGNOSIS — F4010 Social phobia, unspecified: Secondary | ICD-10-CM | POA: Diagnosis not present

## 2015-11-17 DIAGNOSIS — F32A Depression, unspecified: Secondary | ICD-10-CM

## 2015-11-17 MED ORDER — ESCITALOPRAM OXALATE 20 MG PO TABS: 20.0000 mg | ORAL_TABLET | Freq: Every day | ORAL | Status: DC

## 2015-11-17 MED ORDER — BUPROPION HCL 100 MG PO TABS: 100.0000 mg | ORAL_TABLET | Freq: Every day | ORAL | Status: DC
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
## 2015-11-30 ENCOUNTER — Encounter (HOSPITAL_COMMUNITY): Payer: Self-pay | Admitting: Psychiatry

## 2015-11-30 ENCOUNTER — Ambulatory Visit (INDEPENDENT_AMBULATORY_CARE_PROVIDER_SITE_OTHER): Payer: BLUE CROSS/BLUE SHIELD | Admitting: Psychiatry

## 2015-11-30 ENCOUNTER — Ambulatory Visit (HOSPITAL_COMMUNITY): Payer: Self-pay | Admitting: Psychiatry

## 2015-11-30 DIAGNOSIS — F329 Major depressive disorder, single episode, unspecified: Secondary | ICD-10-CM

## 2015-11-30 DIAGNOSIS — F32A Depression, unspecified: Secondary | ICD-10-CM
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
## 2015-11-30 NOTE — Patient Instructions (Signed)
Discussed orally 

## 2015-12-14 ENCOUNTER — Encounter (HOSPITAL_COMMUNITY): Payer: Self-pay | Admitting: Psychiatry

## 2015-12-14 ENCOUNTER — Ambulatory Visit (INDEPENDENT_AMBULATORY_CARE_PROVIDER_SITE_OTHER): Payer: BLUE CROSS/BLUE SHIELD | Admitting: Psychiatry

## 2015-12-14 DIAGNOSIS — F411 Generalized anxiety disorder: Secondary | ICD-10-CM | POA: Diagnosis not present

## 2015-12-14 DIAGNOSIS — F32A Depression, unspecified: Secondary | ICD-10-CM

## 2015-12-14 DIAGNOSIS — F329 Major depressive disorder, single episode, unspecified: Secondary | ICD-10-CM
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
## 2015-12-14 NOTE — Patient Instructions (Signed)
Discussed orally 

## 2015-12-25 ENCOUNTER — Ambulatory Visit (HOSPITAL_COMMUNITY): Payer: Self-pay | Admitting: Psychiatry

## 2015-12-25 ENCOUNTER — Encounter (HOSPITAL_COMMUNITY): Payer: Self-pay | Admitting: *Deleted

## 2016-01-22 ENCOUNTER — Encounter (HOSPITAL_COMMUNITY): Payer: Self-pay | Admitting: Psychiatry

## 2016-01-22 ENCOUNTER — Ambulatory Visit (INDEPENDENT_AMBULATORY_CARE_PROVIDER_SITE_OTHER): Payer: BLUE CROSS/BLUE SHIELD | Admitting: Psychiatry

## 2016-01-22 DIAGNOSIS — F329 Major depressive disorder, single episode, unspecified: Secondary | ICD-10-CM | POA: Diagnosis not present

## 2016-01-22 DIAGNOSIS — F411 Generalized anxiety disorder: Secondary | ICD-10-CM | POA: Diagnosis not present

## 2016-01-22 DIAGNOSIS — F32A Depression, unspecified: Secondary | ICD-10-CM

## 2016-01-22 NOTE — Patient Instructions (Signed)
Discussed orally 
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
## 2016-07-01 ENCOUNTER — Ambulatory Visit (INDEPENDENT_AMBULATORY_CARE_PROVIDER_SITE_OTHER): Payer: BLUE CROSS/BLUE SHIELD | Admitting: Neurology

## 2016-07-01 ENCOUNTER — Encounter: Payer: Self-pay | Admitting: Neurology

## 2016-07-01 VITALS — BP 114/72 | HR 67 | Ht 66.0 in | Wt 184.6 lb

## 2016-07-01 DIAGNOSIS — R5383 Other fatigue: Secondary | ICD-10-CM | POA: Diagnosis not present

## 2016-07-01 DIAGNOSIS — R519 Headache, unspecified: Secondary | ICD-10-CM

## 2016-07-01 DIAGNOSIS — M6289 Other specified disorders of muscle: Secondary | ICD-10-CM

## 2016-07-01 DIAGNOSIS — M791 Myalgia, unspecified site: Secondary | ICD-10-CM

## 2016-07-01 DIAGNOSIS — M2550 Pain in unspecified joint: Secondary | ICD-10-CM | POA: Diagnosis not present

## 2016-07-01 DIAGNOSIS — M6281 Muscle weakness (generalized): Secondary | ICD-10-CM

## 2016-07-01 DIAGNOSIS — R51 Headache: Secondary | ICD-10-CM

## 2016-07-01 DIAGNOSIS — R4781 Slurred speech: Secondary | ICD-10-CM | POA: Diagnosis not present

## 2016-07-01 DIAGNOSIS — G35 Multiple sclerosis: Secondary | ICD-10-CM

## 2016-07-01 DIAGNOSIS — R202 Paresthesia of skin: Secondary | ICD-10-CM

## 2016-07-01 DIAGNOSIS — R4789 Other speech disturbances: Secondary | ICD-10-CM | POA: Diagnosis not present

## 2016-07-01 DIAGNOSIS — H539 Unspecified visual disturbance: Secondary | ICD-10-CM

## 2016-07-01 NOTE — Patient Instructions (Signed)
Remember to drink plenty of fluid, eat healthy meals and do not skip any meals. Try to eat protein with a every meal and eat a healthy snack such as fruit or nuts in between meals. Try to keep a regular sleep-wake schedule and try to exercise daily, particularly in the form of walking, 20-30 minutes a day, if you can.   As far as diagnostic testing: MRI brain   I would like to see you back if needed in 3-4 months, sooner if we need to. Please call us with any interim questions, concerns, problems, updates or refill requests.   Our phone number is [phone]. We also have an after hours call service for urgent matters and there is a physician on-call for urgent questions. For any emergencies you know to call 911 or go to the nearest emergency room

## 2016-07-01 NOTE — Progress Notes (Signed)
[facility]    Provider:  Dr Jaynee Eagles Referring Provider: Redmond School, MD Primary Care Physician:  Redmond School MD  CC:  Muscle aches, joint pain, fatigue  HPI:  ARABEL Larson is a 40 y.o. female here as a referral from Dr. Gerarda Fraction. Past medical history of obesity, hypothyroidism, polyarthralgia and myalgia, chronic anxiety, osteoarthritis. Family history of lupus. Patient says she is her for  evaluation of MS. She has numbness and tinlging in the botom of the feet. She is off balance. Her joints are aching. She feels exhausted. Underneath her nose and across the nose she had a rash on her face like in Lupus. Symptoms started 6 weeks ago, she started getting more tired and her bones and joints started aching in her legs.  Pain in the joints and up into the arms and in the anteior thighs. She feels like someone is stabbing the bottom of her heet in the morning. She has had the tinlging in her feet off and on for the last several years without progression but all other symptoms are new. Also with Muscle aching and  generalized weakness. She had an episode of slurred speech, not in the setting of a headache, she hadn;t felt good that day and all of a sudden her words wouldn;t come out, she went ot the [facility] urgent care and they said she had weakness on one side. Her mother has MS and sees Dr. Jannifer Larson in our group. Patient has vision changes in the right eye. She also has headaches. She was very sick for 5 months previous to symptoms, sinus problems. No other focal neurologic deficits or complaints. Patient has headaches and these have not significantly changed, these ar not concerning to her she is more worried about her recent symptoms.  Reviewed notes, labs and imaging from outside physicians, which showed:  Patient was seen at [facility] in [facility] on 05/31/2016 for numbness in both sides legs and arms, headaches, blurred vision and being tired for 3 days. She also  reported blurring of vision, dizziness, numbness and tingling in the left upper extremity, numbness and tingling in the right upper chin many, fatigue, lethargic, feeling of being drunk. Examination including head, eyes, ears, nose, throat, neck, cardiovascular, lungs, abdomen, skin, extremities and neurologic workup were negative. Patient was sent to the emergency room.( I don't have records from the emergency room, will request.)  BUN 17, creatinine is 0.740 labs drawn 06/04/2016.  Review primary care notes. At last appointment at primary care it appears patient had extensive testing ordered including ANA, CBC, CMP, ESR, folate, Lyme, lipid, rheumatoid factor, thyroid, B12, vitamin D, bilirubin. (I don't have results, we'll request, per patient results are normal)  Review previous notes in Epic. Previous history of overdosing on medications including clonazepam, Ambien and Requip at the end of 2016. She was [facility]. She was diagnosed as possibly bipolar d/o. This is an unclear diagnosis. Her Latuda was later changed to Lexapro and Wellbutrin and she enrolled in counseling and recent diagnoses include depression and generalized anxiety and not bipolar disorder. Patient has been treated for mood disorder since 2014.   Review of Systems: Patient complains of symptoms per HPI as well as the following symptoms: Weight gain, blurred vision, easy bruising, shortness of breath, swelling in legs, joint pain, cramps, aching muscles, rash. Pertinent negatives per HPI. All others negative.   Social History   Social History  . Marital status: Married  Spouse name: Erica Larson  . Number of children: 1  . Years of education: Some college   Occupational History  . [employer]   . Private sitting    Social History Main Topics  . Smoking status: Never Smoker  . Smokeless tobacco: Never Used  . Alcohol use No  . Drug use: No  . Sexual activity: Yes     Birth control/ protection: Inserts, IUD   Other Topics Concern  . Not on file   Social History Narrative   Lives with husband   hhof 3 married non smoker     Daughter husband and pet dog   Caffeine use: Diet coke    Family History  Problem Relation Age of Onset  . Thyroid disease Mother   . Nephrolithiasis Mother   . Urolithiasis Mother   . Heart attack Mother     signs but neg eval  . Nephrolithiasis Father   . Urolithiasis Father   . Anxiety disorder Father   . Drug abuse Father   . Thyroid disease Sister   . Heart disease Maternal Grandmother     Past Medical History:  Diagnosis Date  . Acne   . Amenorrhea   . Infertility, female    treatment with oligammenorhea  Dr Carren Rang   . Migraine   . Restless legs syndrome   . Thyroiditis     Past Surgical History:  Procedure Laterality Date  . DILATION AND CURETTAGE OF UTERUS      Current Outpatient Prescriptions  Medication Sig Dispense Refill  . levothyroxine (SYNTHROID, LEVOTHROID) 25 MCG tablet Take 50 mcg by mouth.     Marland Kitchen PHENTERMINE HCL PO Take 18.75 mg by mouth daily.    Marland Kitchen rOPINIRole (REQUIP) 1 MG tablet Take 1 mg by mouth at bedtime.     No current facility-administered medications for this visit.     Allergies as of 07/01/2016  . (No Known Allergies)    Vitals: BP 114/72 (BP Location: Right Arm, Patient Position: Sitting, Cuff Size: Normal)   Pulse 67   Ht 5' 6"  (1.676 m)   Wt 184 lb 9.6 oz (83.7 kg)   BMI 29.80 kg/m  Last Weight:  Wt Readings from Last 1 Encounters:  07/01/16 184 lb 9.6 oz (83.7 kg)   Last Height:   Ht Readings from Last 1 Encounters:  07/01/16 5' 6"  (1.676 m)   Physical exam: Exam: Gen: NAD, conversant, well nourised, well groomed                     CV: RRR, no MRG. No Carotid Bruits. No peripheral edema, warm, nontender Eyes: Conjunctivae clear without exudates or hemorrhage  Neuro: Detailed Neurologic Exam  Speech:    Speech is normal; fluent and spontaneous with  normal comprehension.  Cognition:    The patient is oriented to person, place, and time;     recent and remote memory intact;     language fluent;     normal attention, concentration,     fund of knowledge Cranial Nerves:    The pupils are equal, round, and reactive to light. The fundi are normal and spontaneous venous pulsations are present. Visual fields are full to finger confrontation. Extraocular movements are intact. Trigeminal sensation is intact and the muscles of mastication are normal. The face is symmetric. The palate elevates in the midline. Hearing intact. Voice is normal. Shoulder shrug is normal. The tongue has normal motion without fasciculations.   Coordination:  Normal finger to nose and heel to shin. Normal rapid alternating movements.   Gait:    Heel-toe and tandem gait are normal.   Motor Observation: Mild right-sided motor weakness. Otherwise no asymmetry, no atrophy, and no involuntary movements noted. Tone:    Normal muscle tone.    Posture:    Posture is normal. normal erect    Strength:    Strength is V/V in the upper and lower limbs.      Sensation: intact to LT     Reflex Exam:  DTR's:    Deep tendon reflexes in the upper and lower extremities are normal bilaterally.   Toes:    The toes are downgoing bilaterally.   Clonus:    Clonus is absent.       Assessment/Plan:  40 y.o. female here as a referral from Dr. Gerarda Fraction. Past medical history of obesity, hypothyroidism, polyarthralgia and myalgia, chronic anxiety, osteoarthritis. Family history of lupus. Referred her for Headaches however patient says she is not here for that she wants to be referred for evaluation of MS. she has multiple complaints including numbness and tingling in her feet, feeling off balance, aching joints, generalized fatigue and malaise, muscle pain, generalized weakness, slurred speech. Patient reports symptoms started 6 weeks ago. Previous to this she reports illness possibly  sinus infection. Could be a postviral syndrome that will resolve on its own. However patient also complains of headaches and vision changes. She is family history of multiple sclerosis and wants to make sure she does not have MS. We'll perform an MRI of the brain with and without contrast.   Cc: Dr. Maryann Conners, [facility] [address] [place], [place] [address]  Phone [phone] Fax [phone]

## 2016-07-15 ENCOUNTER — Telehealth: Payer: Self-pay | Admitting: *Deleted

## 2016-07-15 NOTE — Telephone Encounter (Signed)
She never had MRI ordered by Dr. Sherwood Gambler.  Dr. Lucia Gaskins is reordering MRI and has spoken to her insurance company.  The scan will be approved.

## 2016-07-15 NOTE — Telephone Encounter (Signed)
Left message for a return call.  Dr. Sherwood Gambler w/ [facility] had placed an order for MRI brain.  Dr. Lucia Gaskins would like to know if she completed this study.

## 2016-07-15 NOTE — Telephone Encounter (Signed)
-----   Message from Leanna Battlesanielle N White sent at 07/15/2016  7:35 AM EDT ----- This patient's MRI brain w/wo is requiring a P2P. Please call [facility] at [phone] request will end on 07/16/16.

## 2016-07-18 NOTE — Addendum Note (Signed)
Addended by: Naomie DeanAHERN, Logun Colavito B on: 07/18/2016 06:27 PM   Modules accepted: Orders

## 2016-08-12 ENCOUNTER — Telehealth: Payer: Self-pay | Admitting: Neurology

## 2016-08-12 MED ORDER — GABAPENTIN 100 MG PO CAPS: 100.0000 mg | ORAL_CAPSULE | Freq: Three times a day (TID) | ORAL | 1 refills | Status: DC

## 2016-08-12 NOTE — Telephone Encounter (Signed)
I called the patient. She is having pain starting in the neck and low back going down both legs to the feet. The pain is sharp and severe. I will start gabapentin for her to control the pain.    MRI of the brain is pending.

## 2016-08-12 NOTE — Telephone Encounter (Signed)
Pt called sts she is having increased pain from the bottom of the foot up the leg and base of the neck and down. She said she is on her feet more now with work. She is asking if there is something that she could take OTC or RX

## 2016-08-12 NOTE — Telephone Encounter (Signed)
Pt called inquiring about PA for MRI. She is ready to schedule

## 2016-08-12 NOTE — Telephone Encounter (Signed)
Dr Lucia GaskinsAhern- please advise. You last saw patient on 07/01/16 for headaches. You did mention symptoms below that pt is describing. Looks like patient has not had MRI ordered on 8/31. Andrey CampanileSandy also routed message to danielle to check on PA for this.

## 2016-08-13 NOTE — Telephone Encounter (Signed)
Dr Lucia Gaskins- see original message from patient about increased pain. She is wondering if she can take something OTC for this. Please advise.  FYI- Pt MRI order sent to [facility]. Danielle spoke to patient.

## 2016-08-13 NOTE — Telephone Encounter (Signed)
Called patient to let her know that MRI had been sent to [facility] and had been approved, gave her their phone number. She requested to speak with the nurse regarding her pain management. Please call and advise.

## 2016-08-13 NOTE — Telephone Encounter (Signed)
Patient was started on Neurontin for her paina nd needs MRi of the brain thanks

## 2016-08-13 NOTE — Telephone Encounter (Signed)
Pt called in about MRI order. Does she need one? No on has contacted her to schedule. Please call and advise [phone]

## 2016-08-13 NOTE — Telephone Encounter (Signed)
Dr Lucia Gaskins- Lorain Childes. See other phone note. Please advise

## 2016-08-13 NOTE — Telephone Encounter (Signed)
Called patient to let her know that MRI had been sent to [facility] and had been approved, gave her their phone number.

## 2016-08-13 NOTE — Telephone Encounter (Signed)
Called patient back. She stated Dr Anne Hahn called and LVM for pt and told her he was calling something in. Per patient he did not state where he calling in med or what he was calling in . She stated she has never taken this medication before and nervous to take this. I went over what the medication was and common/serious SE. Advised her to take 1 tablet to start at night to see how she tolerates medication per Dr Lucia Gaskins. She can increase slowly. She can take up to 3 times daily for nerve pain. She verbalized understanding. She states he has pain in both feet. She states that since medication can cause drowsiness, she is wondering if there is anything she can take until tonight when she can pick up prescription. She states she works in [place] and rx sent to her pharmacy in [place]. Advised I will speak with Dr Lucia Gaskins and call her back to advise. She verbalized understanding.      Per Dr Lucia Gaskins, she can try tylenol or aleve until she can pick up prescription tonight.

## 2016-08-13 NOTE — Telephone Encounter (Signed)
Dr Lucia Gaskins- FYI   Called patient back. Relayed per Dr Lucia Gaskins that she can take OTC ibuprofen or tylenol. She verbalized understanding and stated she has been taking these but has not helped much. She is going to try and get off work early today. She states she called and LVM for [facility] to schedule her MRI. She is waiting to hear back to schedule.

## 2016-08-21 ENCOUNTER — Encounter (HOSPITAL_COMMUNITY): Payer: Self-pay | Admitting: Emergency Medicine

## 2016-08-21 ENCOUNTER — Ambulatory Visit (HOSPITAL_COMMUNITY)
Admission: EM | Admit: 2016-08-21 | Discharge: 2016-08-21 | Disposition: A | Discharge status: Home / Self Care | Payer: BLUE CROSS/BLUE SHIELD | Attending: Nurse Practitioner | Admitting: Nurse Practitioner

## 2016-08-21 DIAGNOSIS — G2581 Restless legs syndrome: Secondary | ICD-10-CM | POA: Diagnosis not present

## 2016-08-21 DIAGNOSIS — J029 Acute pharyngitis, unspecified: Secondary | ICD-10-CM

## 2016-08-21 DIAGNOSIS — G43909 Migraine, unspecified, not intractable, without status migrainosus: Secondary | ICD-10-CM | POA: Insufficient documentation

## 2016-08-21 DIAGNOSIS — R197 Diarrhea, unspecified: Secondary | ICD-10-CM | POA: Diagnosis not present

## 2016-08-21 DIAGNOSIS — K120 Recurrent oral aphthae: Secondary | ICD-10-CM

## 2016-08-21 LAB — POCT URINALYSIS DIP (DEVICE)
BILIRUBIN URINE: NEGATIVE
GLUCOSE, UA: NEGATIVE mg/dL
HGB URINE DIPSTICK: NEGATIVE
Ketones, ur: NEGATIVE mg/dL
LEUKOCYTES UA: NEGATIVE
NITRITE: NEGATIVE
Protein, ur: NEGATIVE mg/dL
Specific Gravity, Urine: <=1.005 (ref 1.005–1.030)
UROBILINOGEN UA: 0.2 mg/dL (ref 0.0–1.0)
pH: 5.5 (ref 5.0–8.0)

## 2016-08-21 LAB — POCT RAPID STREP A: STREPTOCOCCUS, GROUP A SCREEN (DIRECT): NEGATIVE

## 2016-08-21 MED ORDER — MAGIC MOUTHWASH W/LIDOCAINE: 5.0000 mL | Freq: Four times a day (QID) | ORAL | 0 refills | Status: AC | PRN

## 2016-08-21 NOTE — ED Provider Notes (Signed)
CSN: [id number]     Arrival date & time 08/21/16  0957 History   First MD Initiated Contact with Patient 08/21/16 1016     Chief Complaint  Patient presents with  . Sore Throat   (Consider location/radiation/quality/duration/timing/severity/associated sxs/prior Treatment) Patient is a 40 y.o female, presents today for sore throat and body ache onset yesterday. She reports having a canker sore on her right side of tongue. She reports the roof of her mouth hurts. She also reports starting to get a bit congested. She also reports diarrhea onset 5 days ago and is improving but 1 episode of diarrhea so far today compared to 4 episodes yesterday. She endorses lower abdominal pressure and mild pain in her bilateral flank area. She denies dysuria, vaginal discharge.       Past Medical History:  Diagnosis Date  . Acne   . Amenorrhea   . Infertility, female    treatment with oligammenorhea  Dr Chevis Pretty   . Migraine   . Restless legs syndrome   . Thyroiditis    Past Surgical History:  Procedure Laterality Date  . DILATION AND CURETTAGE OF UTERUS     Family History  Problem Relation Age of Onset  . Thyroid disease Mother   . Nephrolithiasis Mother   . Urolithiasis Mother   . Heart attack Mother     signs but neg eval  . Nephrolithiasis Father   . Urolithiasis Father   . Anxiety disorder Father   . Drug abuse Father   . Thyroid disease Sister   . Heart disease Maternal Grandmother    Social History  Substance Use Topics  . Smoking status: Never Smoker  . Smokeless tobacco: Never Used  . Alcohol use No   OB History    Gravida Para Term Preterm AB Living   3 1   1 2 1    SAB TAB Ectopic Multiple Live Births       2         Review of Systems  Constitutional: Positive for appetite change and fatigue. Negative for chills and fever.  HENT: Positive for congestion and sore throat. Negative for ear pain, rhinorrhea, sinus pressure and sneezing.   Respiratory: Negative for cough,  shortness of breath and wheezing.   Cardiovascular: Negative for chest pain and palpitations.  Gastrointestinal: Positive for diarrhea. Negative for abdominal pain and nausea.       +abdominal pressure   Genitourinary: Positive for flank pain. Negative for dysuria and vaginal discharge.  Musculoskeletal: Positive for myalgias.  Neurological: Positive for headaches. Negative for dizziness and weakness.    Allergies  Review of patient's allergies indicates no known allergies.  Home Medications   Prior to Admission medications   Medication Sig Start Date End Date Taking? Authorizing Provider  levothyroxine (SYNTHROID, LEVOTHROID) 25 MCG tablet Take 50 mcg by mouth.  09/10/13  Yes Historical Provider, MD  rOPINIRole (REQUIP) 1 MG tablet Take 1 mg by mouth at bedtime.   Yes Historical Provider, MD  gabapentin (NEURONTIN) 100 MG capsule Take 1 capsule (100 mg total) by mouth 3 (three) times daily. 08/12/16   York Spaniel, MD  magic mouthwash w/lidocaine SOLN Take 5 mLs by mouth 4 (four) times daily as needed for mouth pain. 08/21/16 08/26/16  Lucia Estelle, NP  PHENTERMINE HCL PO Take 18.75 mg by mouth daily.    Historical Provider, MD   Meds Ordered and Administered this Visit  Medications - No data to display  BP 123/84 (BP Location: Left  Arm)   Pulse 69   Temp 98 F (36.7 C) (Oral)   Resp 18   SpO2 100%  No data found.   Physical Exam  Constitutional: She is oriented to person, place, and time. She appears well-developed and well-nourished.  HENT:  Head: Normocephalic and atraumatic.  Right Ear: External ear normal.  Left Ear: External ear normal.  Nose: Nose normal.  Mouth/Throat: Oropharynx is clear and moist. No oropharyngeal exudate.  Tonsils 2+ with no erythema or exudate. Has a small, shallow sore on the right lower tongue.   Eyes: Conjunctivae and EOM are normal. Pupils are equal, round, and reactive to light. Right eye exhibits no discharge. Left eye exhibits no  discharge.  Neck: Normal range of motion. Neck supple.  Cardiovascular: Normal rate, regular rhythm and normal heart sounds.   Pulmonary/Chest: Effort normal and breath sounds normal. No respiratory distress. She has no wheezes.  Abdominal: Soft. Bowel sounds are normal. She exhibits no distension. There is tenderness.  Suprapubic tenderness present  Genitourinary:  Genitourinary Comments: -cva tenderness  Musculoskeletal: Normal range of motion.  Lymphadenopathy:    She has no cervical adenopathy.  Neurological: She is alert and oriented to person, place, and time.  Skin: Skin is warm and dry.  Nursing note and vitals reviewed.   Urgent Care Course   Clinical Course    Procedures (including critical care time)  Labs Review Labs Reviewed  POCT URINALYSIS DIP (DEVICE)  POCT RAPID STREP A    Imaging Review No results found.   MDM   1. Sore throat   2. Canker sores oral    1) UA negative for UTI. Continue to monitor the diarrhea; it appears that it is improving.  F/u with PCP if the diarrhea and the abdominal pressure worsen.   2) Rapid strep negative; patient informed we will call her if culture is positive  3) Canker Sore: Prescription given for magic mouthwash.   4) F/u with PCP if sore throat and canker sore does not improve.   Discharge paperwork given. Patient has no questions.    Lucia EstelleFeng Eli Adami, NP 08/21/16 1110

## 2016-08-21 NOTE — ED Triage Notes (Signed)
[facility] with a complaint of a sore throat with body aches that started yesterday. The patient also reported diarrhea that started 5 days ago. The patient denied any fever.

## 2016-08-23 ENCOUNTER — Other Ambulatory Visit: Payer: Self-pay

## 2016-08-24 LAB — CULTURE, GROUP A STREP (THRC)

## 2016-08-27 ENCOUNTER — Encounter (HOSPITAL_COMMUNITY): Payer: Self-pay | Admitting: Emergency Medicine

## 2016-08-27 ENCOUNTER — Emergency Department (HOSPITAL_COMMUNITY)
Admission: EM | Admit: 2016-08-27 | Discharge: 2016-08-27 | Disposition: A | Discharge status: Home / Self Care | Payer: BLUE CROSS/BLUE SHIELD | Attending: Dermatology | Admitting: Dermatology

## 2016-08-27 DIAGNOSIS — Z5321 Procedure and treatment not carried out due to patient leaving prior to being seen by health care provider: Secondary | ICD-10-CM | POA: Insufficient documentation

## 2016-08-27 DIAGNOSIS — T426X2A Poisoning by other antiepileptic and sedative-hypnotic drugs, intentional self-harm, initial encounter: Secondary | ICD-10-CM | POA: Diagnosis not present

## 2016-08-27 DIAGNOSIS — Z79899 Other long term (current) drug therapy: Secondary | ICD-10-CM | POA: Diagnosis not present

## 2016-08-27 NOTE — ED Triage Notes (Signed)
Pt sitting on curb, says does not want to be evaluated.  Dr.Lockwood aware.

## 2016-08-27 NOTE — ED Triage Notes (Signed)
Pt reports taking Ambien, Trazadone and Abilify this am. Pt states she has "vomited and I'm feeling fine." Pt states she was attempting to harm herself. Pt has made previous attempts. Pt states she took 14 of each med.
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
## 2016-09-19 ENCOUNTER — Encounter (HOSPITAL_COMMUNITY): Payer: Self-pay | Admitting: Psychiatry

## 2016-09-19 ENCOUNTER — Other Ambulatory Visit (HOSPITAL_COMMUNITY): Payer: BLUE CROSS/BLUE SHIELD | Attending: Psychiatry | Admitting: Psychiatry

## 2016-09-19 DIAGNOSIS — Z79899 Other long term (current) drug therapy: Secondary | ICD-10-CM | POA: Diagnosis not present

## 2016-09-19 DIAGNOSIS — G2581 Restless legs syndrome: Secondary | ICD-10-CM | POA: Insufficient documentation

## 2016-09-19 DIAGNOSIS — F418 Other specified anxiety disorders: Secondary | ICD-10-CM | POA: Insufficient documentation

## 2016-09-19 DIAGNOSIS — Z8249 Family history of ischemic heart disease and other diseases of the circulatory system: Secondary | ICD-10-CM | POA: Insufficient documentation

## 2016-09-19 DIAGNOSIS — Z841 Family history of disorders of kidney and ureter: Secondary | ICD-10-CM | POA: Diagnosis not present

## 2016-09-19 DIAGNOSIS — F329 Major depressive disorder, single episode, unspecified: Secondary | ICD-10-CM

## 2016-09-19 DIAGNOSIS — F3181 Bipolar II disorder: Secondary | ICD-10-CM | POA: Diagnosis present

## 2016-09-19 DIAGNOSIS — Z8349 Family history of other endocrine, nutritional and metabolic diseases: Secondary | ICD-10-CM | POA: Insufficient documentation

## 2016-09-19 DIAGNOSIS — F32A Depression, unspecified: Secondary | ICD-10-CM
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
## 2016-09-20 ENCOUNTER — Other Ambulatory Visit (HOSPITAL_COMMUNITY): Payer: BLUE CROSS/BLUE SHIELD

## 2016-09-23 ENCOUNTER — Other Ambulatory Visit (HOSPITAL_COMMUNITY): Payer: BLUE CROSS/BLUE SHIELD | Admitting: Psychiatry

## 2016-09-23 DIAGNOSIS — F3181 Bipolar II disorder: Secondary | ICD-10-CM | POA: Diagnosis not present

## 2016-09-23 DIAGNOSIS — F32A Depression, unspecified: Secondary | ICD-10-CM

## 2016-09-23 DIAGNOSIS — F329 Major depressive disorder, single episode, unspecified: Secondary | ICD-10-CM

## 2016-09-24 ENCOUNTER — Other Ambulatory Visit (HOSPITAL_COMMUNITY): Payer: BLUE CROSS/BLUE SHIELD | Admitting: Psychiatry

## 2016-09-24 DIAGNOSIS — F329 Major depressive disorder, single episode, unspecified: Secondary | ICD-10-CM

## 2016-09-24 DIAGNOSIS — F32A Depression, unspecified: Secondary | ICD-10-CM

## 2016-09-24 DIAGNOSIS — F3181 Bipolar II disorder: Secondary | ICD-10-CM | POA: Diagnosis not present

## 2016-09-24 NOTE — Progress Notes (Signed)
    Daily Group Progress Note  Program: IOP   Group Time: 9:00-12:00   Participation Level:  active   Behavioral Response:  responsive  Summary of Progress:  Patient reports feeling anxious today. Today was the first day of Psych IOP. Shared that she is dealing with depression, episodes or rage, and anxiety. She was reflecting today on "gaining control of her life". The group shared coping strategies that they have learned (breathing exercises, positive self talk, relaxing techniques, etc.). Patient also mentioned that she recently overdosed and was admitted to the hospital for stabilization. She appeared anxious but motivated to work on her issues. Patient still struggling with staying in a marriage and is looking for balance in her life. Patient was open to the group providing insight about different types of balance (spiritual, emotional, physical, etc.)  Shaune Pollack, LPC

## 2016-09-25 ENCOUNTER — Other Ambulatory Visit (HOSPITAL_COMMUNITY): Payer: BLUE CROSS/BLUE SHIELD | Admitting: Psychiatry

## 2016-09-26 ENCOUNTER — Other Ambulatory Visit (HOSPITAL_COMMUNITY): Payer: BLUE CROSS/BLUE SHIELD

## 2016-09-27 ENCOUNTER — Other Ambulatory Visit (HOSPITAL_COMMUNITY): Payer: BLUE CROSS/BLUE SHIELD

## 2016-09-30 ENCOUNTER — Other Ambulatory Visit (HOSPITAL_COMMUNITY): Payer: BLUE CROSS/BLUE SHIELD

## 2016-09-30 NOTE — Progress Notes (Signed)
    Daily Group Progress Note  Program: IOP  Group Time: 9:00-12:00   Participation Level: minimal   Behavioral Response: { flat   Type of Therapy:   group therapy   Summary of Progress:  Pt was mostly silent in group, but alert and attentive. Pt. Listened to discussion about use of mindfulness and expressed that she felt uncertain how to grasp the concept of mindfulness.   Shaune Pollack, LPC

## 2016-09-30 NOTE — Progress Notes (Signed)
    Daily Group Progress Note  Program: IOP Group Time: 9:00-12:00   Participation Level: active   Behavioral Response: responsive   Type of Therapy:   group therapy   Summary of Progress:  Patient is in a 64 year marriage, which she has repeatedly tried to leave, but has not succeeded in doing. She identified closely with another group member, who also had a 20 year relationship which was not very healthy. At present patient is in individual therapy and is processing some of her childhood trauma in therapy.    Shaune Pollack, LPC

## 2016-10-01 ENCOUNTER — Other Ambulatory Visit (HOSPITAL_COMMUNITY): Payer: BLUE CROSS/BLUE SHIELD

## 2016-10-02 ENCOUNTER — Other Ambulatory Visit (HOSPITAL_COMMUNITY): Payer: BLUE CROSS/BLUE SHIELD

## 2016-10-03 ENCOUNTER — Other Ambulatory Visit (HOSPITAL_COMMUNITY): Payer: BLUE CROSS/BLUE SHIELD

## 2016-10-04 ENCOUNTER — Other Ambulatory Visit (HOSPITAL_COMMUNITY): Payer: BLUE CROSS/BLUE SHIELD

## 2016-10-07 ENCOUNTER — Other Ambulatory Visit (HOSPITAL_COMMUNITY): Payer: BLUE CROSS/BLUE SHIELD | Admitting: Psychiatry

## 2016-10-07 NOTE — Progress Notes (Signed)
AKACIA BOLTZ is a 40 y.o. , married, employed, Caucasian female who was referred per therapist (Ami Turner, [facility]); treatment for worsening depressive symptoms with mood swings.  Pt admitted to vague SI, no plan or intent.  Discussed safety options with pt.  She is able to contract for safety.  Stressors/Triggers:  1)  Conflictual Relationships:  Marriage:  Pt has been married to husband for 21 yrs.  Pt states that husband sees everything "black and white."  "He spent time in [place] as a Chief Strategy Officer and whenever he returned he was ordering me and my daughter around."  Pt reports having difficulty with trusting anyone.  Pt also mentioned that she doesn't get along with her mother.  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  Pt reports that she has limited support.  2)  Job:  Pt works two jobs.  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  Pt has had one prior psychiatric admit [facility]) last yr d/t an OD.  No other suicide attempts or gestures.  Family Hx:  Deceased Father (Addict). Pt only attended [facility] for three days.  Pt reports that she is having issues with her insurance company not agreeing to pay for group.  Apparently, pt hasn't met her deductible.  "I am waiting for a bill from [facility] so my insurance co can see how much the bill is before they decide on a discount.  I can't afford getting a huge bill."  Pt denies any current SI.  Plans to f/u with her outside providers.  A:  D/C today.  F/U with Annalee Genta, PA and Darcus Austin, LPC.  Encouraged support groups.  Pt was provided with a financial assistance form to be completed.  R:  Pt receptive.                                              Carlis Abbott, RITA, M.Ed, CNA

## 2016-10-07 NOTE — Progress Notes (Signed)
BH IOP DISCHARGE NOTE  Patient:  Erica Larson DOB:  Oct 19, 1976  Date of Admission: 09/19/2016  Date of Discharge: 10/07/2016  Reason for Admission:depression  IOP Course:[name] attended only a few days and did not return.  Consequently will be discharged   Mental Status at Discharge:phone follow up indicated no suicidal thoughts  Diagnosis: bipolar 2 disorder, major depression-  Level of Care:  IOP  Discharge destination:  [name] PA, Mora Bellman, [facility]   Comments:  none  The patient received suicide prevention pamphlet:  Yes   Carolanne Grumbling, MD

## 2016-10-08 ENCOUNTER — Other Ambulatory Visit (HOSPITAL_COMMUNITY): Payer: BLUE CROSS/BLUE SHIELD

## 2016-10-09 ENCOUNTER — Other Ambulatory Visit (HOSPITAL_COMMUNITY): Payer: BLUE CROSS/BLUE SHIELD

## 2016-10-11 ENCOUNTER — Other Ambulatory Visit (HOSPITAL_COMMUNITY): Payer: BLUE CROSS/BLUE SHIELD

## 2016-10-14 ENCOUNTER — Other Ambulatory Visit (HOSPITAL_COMMUNITY): Payer: BLUE CROSS/BLUE SHIELD

## 2016-10-15 ENCOUNTER — Other Ambulatory Visit (HOSPITAL_COMMUNITY): Payer: BLUE CROSS/BLUE SHIELD

## 2016-10-16 ENCOUNTER — Other Ambulatory Visit (HOSPITAL_COMMUNITY): Payer: BLUE CROSS/BLUE SHIELD

## 2016-10-17 ENCOUNTER — Other Ambulatory Visit (HOSPITAL_COMMUNITY): Payer: BLUE CROSS/BLUE SHIELD

## 2016-10-17 ENCOUNTER — Encounter (HOSPITAL_COMMUNITY): Payer: Self-pay

## 2016-10-17 NOTE — Progress Notes (Signed)
Ms Erica Larson was out working on straightening out insurance issues but is back saying sll the same issues that were present on admission persist and she is still depressed and very irritable. Retain diagnosis of bipolar disorder,type 2, major depressive type, moderate Re admit to IOP for daily group therapy and continue current medications.  Admit date will be 10/18/2016
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
## 2016-10-18 ENCOUNTER — Other Ambulatory Visit (HOSPITAL_COMMUNITY): Payer: BLUE CROSS/BLUE SHIELD

## 2016-10-21 ENCOUNTER — Other Ambulatory Visit (HOSPITAL_COMMUNITY): Payer: BLUE CROSS/BLUE SHIELD | Attending: Psychiatry | Admitting: Psychiatry

## 2016-10-21 ENCOUNTER — Other Ambulatory Visit (HOSPITAL_COMMUNITY): Payer: BLUE CROSS/BLUE SHIELD

## 2016-10-21 DIAGNOSIS — F329 Major depressive disorder, single episode, unspecified: Secondary | ICD-10-CM | POA: Diagnosis present

## 2016-10-21 DIAGNOSIS — F3181 Bipolar II disorder: Secondary | ICD-10-CM | POA: Insufficient documentation

## 2016-10-21 NOTE — Progress Notes (Addendum)
Daily Group Progress Note     Program: IOP   Group Time: 11:00-12:00pm  Participation Level: Active  Behavioral Response: Appropriate  Type of Therapy:  Psychoeducation/Therapy  Summary of Progress: Pt participated in an artistic activity. Pt was asked to draw her depression. The stigma of mental illness interferes with the ability to explain the feelings of depression and anxiety to friends and family members. After drawing her feelings, she shared that now she could possibly use the picture to help explain her mental illness to her family. Pt was open to suggestions during the intervention.  Vernona Rieger, LCAS   Daily Group Progress Note  Program: IOP  Group Time: 9:00-11:00  Participation Level: Active  Behavioral Response: Appropriate  Type of Therapy:  Group Therapy  Summary of Progress: Pt. Presented as talkative, alert, engaged in the group process. Pt. Stated that she had a difficult weekend and is experiencing grief related to loss of relationship with her husband. Pt. Connected with group members around theme of trying to please others and the consequences of not asserting herself and allowing others to control her. Pt. Also connecting with them of grief related to the loss of a loved one.       Shaune Pollack, LPC

## 2016-10-22 ENCOUNTER — Encounter (HOSPITAL_COMMUNITY): Payer: Self-pay | Admitting: Psychiatry

## 2016-10-22 ENCOUNTER — Other Ambulatory Visit (HOSPITAL_COMMUNITY): Payer: BLUE CROSS/BLUE SHIELD

## 2016-10-22 ENCOUNTER — Other Ambulatory Visit (HOSPITAL_COMMUNITY): Payer: BLUE CROSS/BLUE SHIELD | Admitting: Psychiatry

## 2016-10-22 DIAGNOSIS — F3181 Bipolar II disorder: Secondary | ICD-10-CM | POA: Diagnosis not present

## 2016-10-22 NOTE — Progress Notes (Signed)
    Daily Group Progress Note  Program: IOP  Group Time: 9:00-12:00  Participation Level: Active  Behavioral Response: Appropriate  Type of Therapy:  Group Therapy/Psychoeducation  Summary of Progress: Pt. Presented as alert, talkative, engaged in the group process. Pt. Shared that she was feeling some anxiety due to having to go to court on tomorrow and defend the restraining order that her husband filed on her. Pt. Shared her reluctance to fight because she wants peace. Pt. Participated in discussion about identifying what we can control and what we cannot. Pt. Was able to identify that she cannot control her husband but she does have control over how she responds to him in court tomorrow. Pt. Watched and discussed Amy Cuddy video on the use of power poses lower cortisol and raise testosterone to improve self-confidence and assertiveness in stressful situations.     Shaune Pollack, LPC

## 2016-10-23 ENCOUNTER — Other Ambulatory Visit (HOSPITAL_COMMUNITY): Payer: BLUE CROSS/BLUE SHIELD

## 2016-10-24 ENCOUNTER — Other Ambulatory Visit (HOSPITAL_COMMUNITY): Payer: BLUE CROSS/BLUE SHIELD

## 2016-10-25 ENCOUNTER — Other Ambulatory Visit (HOSPITAL_COMMUNITY): Payer: BLUE CROSS/BLUE SHIELD | Admitting: Psychiatry

## 2016-10-25 ENCOUNTER — Other Ambulatory Visit (HOSPITAL_COMMUNITY): Payer: BLUE CROSS/BLUE SHIELD

## 2016-10-25 DIAGNOSIS — F3181 Bipolar II disorder: Secondary | ICD-10-CM

## 2016-10-28 ENCOUNTER — Other Ambulatory Visit (HOSPITAL_COMMUNITY): Payer: BLUE CROSS/BLUE SHIELD | Admitting: Psychiatry

## 2016-10-28 ENCOUNTER — Other Ambulatory Visit (HOSPITAL_COMMUNITY): Payer: BLUE CROSS/BLUE SHIELD

## 2016-10-28 DIAGNOSIS — F3181 Bipolar II disorder: Secondary | ICD-10-CM | POA: Diagnosis not present

## 2016-10-28 NOTE — Progress Notes (Signed)
    Daily Group Progress Note  Program: IOP  Group Time: 9:00-12:00  Participation Level: Active  Behavioral Response: Appropriate  Type of Therapy:  Group Therapy  Summary of Progress: Pt. Participated in medication management group with the pharmacist. Pt. Presented as alert and attentive during the group process. Pt. Discussed concepts of journaling and mindfulness and indicated that both practices had been helpful for her in the past few weeks in developing self-awareness and managing stress.      Shaune Pollack, LPC
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
## 2016-10-29 ENCOUNTER — Other Ambulatory Visit (HOSPITAL_COMMUNITY): Payer: BLUE CROSS/BLUE SHIELD | Admitting: Psychiatry

## 2016-10-29 ENCOUNTER — Other Ambulatory Visit (HOSPITAL_COMMUNITY): Payer: BLUE CROSS/BLUE SHIELD

## 2016-10-29 DIAGNOSIS — F3181 Bipolar II disorder: Secondary | ICD-10-CM
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
## 2016-10-30 ENCOUNTER — Other Ambulatory Visit (HOSPITAL_COMMUNITY): Payer: BLUE CROSS/BLUE SHIELD

## 2016-10-31 ENCOUNTER — Other Ambulatory Visit (HOSPITAL_COMMUNITY): Payer: BLUE CROSS/BLUE SHIELD

## 2016-10-31 ENCOUNTER — Other Ambulatory Visit (HOSPITAL_COMMUNITY): Payer: BLUE CROSS/BLUE SHIELD | Admitting: Psychiatry

## 2016-10-31 DIAGNOSIS — F3181 Bipolar II disorder: Secondary | ICD-10-CM
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
## 2016-11-01 ENCOUNTER — Other Ambulatory Visit (HOSPITAL_COMMUNITY): Payer: BLUE CROSS/BLUE SHIELD | Admitting: Psychiatry

## 2016-11-01 ENCOUNTER — Other Ambulatory Visit (HOSPITAL_COMMUNITY): Payer: BLUE CROSS/BLUE SHIELD

## 2016-11-01 DIAGNOSIS — F3181 Bipolar II disorder: Secondary | ICD-10-CM | POA: Diagnosis not present

## 2016-11-04 ENCOUNTER — Other Ambulatory Visit (HOSPITAL_COMMUNITY): Payer: BLUE CROSS/BLUE SHIELD

## 2016-11-04 NOTE — Progress Notes (Signed)
    Daily Group Progress Note Program: IOP  Group Time: 9:00-12:00  Participation Level:  active  Behavioral Response:  responsive  Summary of Progress:  Patient reports feeling anxious today. Shared that she is dealing with depression, episodes of excessive worrying, and anxiety. She was reflecting today on "gaining control of her life". She is wandering if her marriage is facing "the end".  The group shared coping strategies that they have learned (breathing exercises, positive self- talk, relaxing techniques, etc.). She appeared anxious but motivated to work on her issues with spouse. Patient struggling with making her own decisions about her marriage and is looking for balance in her life. Patient was open to the group providing insight about different types of balance (spiritual, emotional, physical, etc.)  Shaune PollackBrown, Jennifer B, LPC

## 2016-11-05 ENCOUNTER — Other Ambulatory Visit (HOSPITAL_COMMUNITY): Payer: BLUE CROSS/BLUE SHIELD | Admitting: Psychiatry

## 2016-11-05 ENCOUNTER — Other Ambulatory Visit (HOSPITAL_COMMUNITY): Payer: BLUE CROSS/BLUE SHIELD

## 2016-11-05 DIAGNOSIS — F3181 Bipolar II disorder: Secondary | ICD-10-CM

## 2016-11-05 NOTE — Progress Notes (Signed)
    Daily Group Progress Note  Program: IOP  Group Time:  9:00-12:00  Participation Level: Active  Behavioral Response: Appropriate  Type of Therapy:  Group Therapy  Summary of Progress: Pt. Presented with bright affect, engaged in the group process. Pt. Discussed her plans to attend support group with mental health association with other members of IOP tomorrow. Pt. Discussed her feelings of anger toward her husband for attempting to create distance in her relationship with her daughter. Pt. Participated in discussion about developing a self-care plan.    Shaune Pollack, LPC

## 2016-11-06 ENCOUNTER — Other Ambulatory Visit (HOSPITAL_COMMUNITY): Payer: BLUE CROSS/BLUE SHIELD

## 2016-11-07 ENCOUNTER — Other Ambulatory Visit (HOSPITAL_COMMUNITY): Payer: BLUE CROSS/BLUE SHIELD

## 2016-11-08 ENCOUNTER — Other Ambulatory Visit (HOSPITAL_COMMUNITY): Payer: BLUE CROSS/BLUE SHIELD

## 2016-11-12 ENCOUNTER — Other Ambulatory Visit (HOSPITAL_COMMUNITY): Payer: BLUE CROSS/BLUE SHIELD

## 2016-11-13 ENCOUNTER — Other Ambulatory Visit (HOSPITAL_COMMUNITY): Payer: BLUE CROSS/BLUE SHIELD | Admitting: Psychiatry

## 2016-11-13 ENCOUNTER — Other Ambulatory Visit (HOSPITAL_COMMUNITY): Payer: BLUE CROSS/BLUE SHIELD

## 2016-11-13 DIAGNOSIS — F3181 Bipolar II disorder: Secondary | ICD-10-CM | POA: Diagnosis not present
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
## 2016-11-14 ENCOUNTER — Other Ambulatory Visit (HOSPITAL_COMMUNITY): Payer: BLUE CROSS/BLUE SHIELD | Admitting: Psychiatry

## 2016-11-14 DIAGNOSIS — F3181 Bipolar II disorder: Secondary | ICD-10-CM | POA: Diagnosis not present

## 2016-11-14 NOTE — Progress Notes (Signed)
[facility] IOP DISCHARGE NOTE  Patient:  Erica Larson DOB:  Dec 22, 1975  Date of Admission: 09/19/2016  Date of Discharge:11/15/2016   Reason for Admission:depression  IOP Course:attended and participated.  More focused on herself and becoming the person she wants to be rather than pleasing others to her detriment, particularly as related to her relationship with her husband.  Mental Status at Discharge:still anxious and depressed.  No suicidal thoughts and hopeful she can make things better  Diagnosis: bipolar 2 disorder, major depressive episode  Level of Care:  IOP  Discharge destination: has appointment with a psychiatric provider and therapist     Comments:  Good progress  The patient received suicide prevention pamphlet:  Yes   Carolanne Grumbling, MD

## 2016-11-14 NOTE — Progress Notes (Signed)
    Daily Group Progress Note  Program: IOP  Group Time: 9:00-12:00  Participation Level: Active  Behavioral Response: Appropriate  Type of Therapy:  Group Therapy  Summary of Progress: Pt. Presented with bright affect. Pt. Reported to the group that she was doing "ok" that she was making progress in creating emotional distance with her husband, understanding his patterns of emotional control. Pt. Reported that she had written him a letter setting forth changes that she was making for herself and expectations from him moving forward.     Shaune Pollack, LPC

## 2016-11-15 ENCOUNTER — Other Ambulatory Visit (HOSPITAL_COMMUNITY): Payer: BLUE CROSS/BLUE SHIELD | Admitting: Psychiatry

## 2016-11-15 DIAGNOSIS — F3181 Bipolar II disorder: Secondary | ICD-10-CM

## 2016-11-19 ENCOUNTER — Other Ambulatory Visit (HOSPITAL_COMMUNITY): Payer: BLUE CROSS/BLUE SHIELD

## 2016-11-19 NOTE — Progress Notes (Signed)
    Daily Group Progress Note  Program: IOP Group Time: 9:00-12:00 (11:00am-11:45am Therapy with the Chaplain-Bob Hamilton)    Participation Level:  active   Behavioral Response:  responsive  Type of Therapy:   group therapy   Summary of Progress:  Patient reports feeling pleasant this morning. Shared that she has mild depressive symptoms and related feelings of anxiety. She did identify marital and custody related issues as specific triggers. However her biggest trigger for today is an argument she had with her mother last night. She is also attempting to make decisions about moving forward with her marriage or ending her marriage. She has been using the strategies provided in group (example: "journaling"). Patient very encouraging to other group members today. She was open to sharing how journaling has helped her cope with on-going and new stressors.  Counselor educated patient about healthy balance practices. Explained that in order to handle life experiences good or bad it's important to not feel that your heart or mind are being pulled too hard in any direction. More often than not, you feel calm, grounded, clear-headed, and motivated in any situation that is presented. This resonated with the patient. She shared with group today that she has plans to work on her relationships with her spouse, daughter, and mother. She also indicated that there are other areas of life that she may be neglecting to create healthy balances such as self-care. Patient plans to do journaling over the weekend and taking the initiative to practice self-care (reading, relaxing, etc.) Melynda Ripple, MS, LCAS-A  Shaune Pollack, LPC

## 2016-11-20 ENCOUNTER — Other Ambulatory Visit (HOSPITAL_COMMUNITY): Payer: BLUE CROSS/BLUE SHIELD

## 2016-11-21 ENCOUNTER — Other Ambulatory Visit (HOSPITAL_COMMUNITY): Payer: BLUE CROSS/BLUE SHIELD | Admitting: Psychiatry

## 2016-11-21 NOTE — Progress Notes (Signed)
BH IOP DISCHARGE NOTE  Patient:  Erica Larson DOB:  12-29-1975  Date of Admission:09/19/2016   Date of Discharge: 11/22/2016  Reason for Admission:depression  IOP Course:attended and participated.  Made gains in learning coping skills and made some decisions about her relationship with her husband which decreased anxiety and depression,  Remains depressed and anxious  Mental Status at Discharge:still depressed but no suicidal thoughts  Diagnosis: bipolar 2 disorder, current episode major depression  Level of Care:  IOP  Discharge destination: has appointments with therapist and psychiatrist     Comments:  Has good potential  Needs to continue working on putting her needs first over others around her  The patient received suicide prevention pamphlet:  Yes   Carolanne Grumbling, MD

## 2016-11-22 ENCOUNTER — Other Ambulatory Visit (HOSPITAL_COMMUNITY): Payer: BLUE CROSS/BLUE SHIELD

## 2016-11-22 NOTE — Patient Instructions (Signed)
Pt never returned to MH-IOP, nor did she call.  Will d/c pt today.  F/U with [name], PA and Mora Bellman, LPC.
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
## 2016-11-25 ENCOUNTER — Other Ambulatory Visit (HOSPITAL_COMMUNITY): Payer: BLUE CROSS/BLUE SHIELD

## 2016-11-26 ENCOUNTER — Other Ambulatory Visit (HOSPITAL_COMMUNITY): Payer: BLUE CROSS/BLUE SHIELD

## 2016-11-27 ENCOUNTER — Other Ambulatory Visit (HOSPITAL_COMMUNITY): Payer: BLUE CROSS/BLUE SHIELD

## 2016-11-28 ENCOUNTER — Other Ambulatory Visit (HOSPITAL_COMMUNITY): Payer: BLUE CROSS/BLUE SHIELD

## 2016-11-29 ENCOUNTER — Other Ambulatory Visit (HOSPITAL_COMMUNITY): Payer: BLUE CROSS/BLUE SHIELD

## 2016-12-02 ENCOUNTER — Other Ambulatory Visit (HOSPITAL_COMMUNITY): Payer: BLUE CROSS/BLUE SHIELD

## 2016-12-03 ENCOUNTER — Other Ambulatory Visit (HOSPITAL_COMMUNITY): Payer: BLUE CROSS/BLUE SHIELD

## 2016-12-04 ENCOUNTER — Other Ambulatory Visit (HOSPITAL_COMMUNITY): Payer: BLUE CROSS/BLUE SHIELD

## 2016-12-05 ENCOUNTER — Other Ambulatory Visit (HOSPITAL_COMMUNITY): Payer: BLUE CROSS/BLUE SHIELD

## 2016-12-06 ENCOUNTER — Other Ambulatory Visit (HOSPITAL_COMMUNITY): Payer: BLUE CROSS/BLUE SHIELD

## 2016-12-09 ENCOUNTER — Other Ambulatory Visit (HOSPITAL_COMMUNITY): Payer: BLUE CROSS/BLUE SHIELD

## 2016-12-10 ENCOUNTER — Other Ambulatory Visit (HOSPITAL_COMMUNITY): Payer: BLUE CROSS/BLUE SHIELD

## 2016-12-11 ENCOUNTER — Other Ambulatory Visit (HOSPITAL_COMMUNITY): Payer: BLUE CROSS/BLUE SHIELD

## 2017-04-23 ENCOUNTER — Other Ambulatory Visit (HOSPITAL_COMMUNITY): Payer: Self-pay | Admitting: Registered Nurse

## 2017-04-23 ENCOUNTER — Ambulatory Visit (HOSPITAL_COMMUNITY): Payer: BLUE CROSS/BLUE SHIELD | Attending: Registered Nurse

## 2017-04-23 DIAGNOSIS — S0990XA Unspecified injury of head, initial encounter: Secondary | ICD-10-CM

## 2017-09-02 ENCOUNTER — Ambulatory Visit (HOSPITAL_COMMUNITY)
Admission: EM | Admit: 2017-09-02 | Discharge: 2017-09-02 | Disposition: A | Discharge status: Home / Self Care | Payer: BLUE CROSS/BLUE SHIELD | Attending: Family Medicine | Admitting: Family Medicine

## 2017-09-02 ENCOUNTER — Encounter (HOSPITAL_COMMUNITY): Payer: Self-pay | Admitting: Emergency Medicine

## 2017-09-02 DIAGNOSIS — M545 Low back pain, unspecified: Secondary | ICD-10-CM

## 2017-09-02 DIAGNOSIS — M7061 Trochanteric bursitis, right hip: Secondary | ICD-10-CM

## 2017-09-02 MED ORDER — TRIAMCINOLONE ACETONIDE 40 MG/ML IJ SUSP
INTRAMUSCULAR | Status: AC
  Filled 2017-09-02: qty 1

## 2017-09-02 MED ORDER — HYDROCODONE-ACETAMINOPHEN 5-325 MG PO TABS: 1.0000 | ORAL_TABLET | Freq: Four times a day (QID) | ORAL | 0 refills | Status: DC | PRN

## 2017-09-02 NOTE — Discharge Instructions (Signed)
Continue Tylenol.  Ice/cold pack over area for 10-15 min every 2-3 hours while awake.  Heat (pad or rice pillow in microwave) over affected area, 10-15 minutes every 2-3 hours while awake.   Use medicine for breakthrough pain only. Do not drive if you have to be on this medicine.

## 2017-09-02 NOTE — ED Provider Notes (Signed)
[facility]    CSN: [id number] Arrival date & time: 09/02/17  1513     History   Chief Complaint Chief Complaint  Patient presents with  . Back Pain  . Leg Pain   HPI Erica Larson is a 41 y.o. female.   HPI Pt presents w 1 day of low back pain and shooting pain down R hip. No injury or change in activity, it has progressively gotten worse. She is not sleeping well because of it. No bowel/bladder incontinence, neurologic s/s's. She has used Tylenol and Ibuprofen w/o sig relief.  Past Medical History:  Diagnosis Date  . Acne   . Amenorrhea   . Anxiety   . Depression   . Infertility, female    treatment with oligammenorhea  Dr Chevis Pretty   . Migraine   . Restless legs syndrome   . Thyroiditis     Patient Active Problem List   Diagnosis Date Noted  . Restless leg syndrome 11/05/2015  . Bipolar 2 disorder, major depressive episode (HCC) 11/04/2015  . Anxiety state, unspecified 09/27/2013  . Fatigue 01/02/2011  . VITAMIN D DEFICIENCY 01/05/2009  . Hypercalcemia 01/05/2009  . NUMBNESS 01/05/2009  . THYROIDITIS 01/02/2009  . PALPITATIONS, RECURRENT 01/02/2009  . SINUSITIS- ACUTE-NOS 08/15/2008  . FEMALE INFERTILITY 07/18/2008  . WEIGHT GAIN 07/18/2008  . ACUTE TONSILLITIS 12/30/2007  . URI 12/30/2007  . MIGRAINE HEADACHE 08/12/2007  . AMENORRHEA 08/12/2007    Past Surgical History:  Procedure Laterality Date  . DILATION AND CURETTAGE OF UTERUS      OB History    Gravida Para Term Preterm AB Living   SAB TAB Ectopic Multiple Live Births       2           Home Medications    Prior to Admission medications   Medication Sig Start Date End Date Taking? Authorizing Provider  levothyroxine (SYNTHROID, LEVOTHROID) 25 MCG tablet Take 50 mcg by mouth.  09/10/13  Yes [provider]  rOPINIRole (REQUIP) 1 MG tablet Take 1 mg by mouth at bedtime.   Yes [provider]  ARIPiprazole (ABILIFY) 30 MG tablet Take 30 mg by  mouth daily.     [provider]  HYDROcodone-acetaminophen (NORCO/VICODIN) 5-325 MG tablet Take 1 tablet by mouth every 6 (six) hours as needed for moderate pain or severe pain. 09/02/17   Sharlene Dory, DO  lamoTRIgine (LAMICTAL) 25 MG tablet Take 25 mg by mouth 2 (two) times daily.    [provider]    Family History Family History  Problem Relation Age of Onset  . Thyroid disease Mother   . Nephrolithiasis Mother   . Urolithiasis Mother   . Heart attack Mother        signs but neg eval  . Nephrolithiasis Father   . Urolithiasis Father   . Anxiety disorder Father   . Drug abuse Father   . Thyroid disease Sister   . Heart disease Maternal Grandmother     Social History Social History  Substance Use Topics  . Smoking status: Never Smoker  . Smokeless tobacco: Never Used  . Alcohol use No     Allergies   Patient has no known allergies.   Review of Systems Review of Systems  Musculoskeletal: Positive for back pain.  Neurological: Negative for weakness.     Physical Exam Triage Vital Signs ED Triage Vitals  Enc Vitals Group  BP 09/02/17 1537 117/83     Pulse Rate 09/02/17 1537 70     Resp 09/02/17 1537 16     Temp 09/02/17 1537 97.7 F (36.5 C)     Temp Source 09/02/17 1537 Oral     SpO2 09/02/17 1537 97 %     Weight 09/02/17 1535 154 lb (69.9 kg)     Height 09/02/17 1535 5\' 6"  (1.676 m)     Pain Score 09/02/17 1535 8   Updated Vital Signs BP 117/83   Pulse 70   Temp 97.7 F (36.5 C) (Oral)   Resp 16   Ht 5\' 6"  (1.676 m)   Wt 154 lb (69.9 kg)   SpO2 97%   BMI 24.86 kg/m   Physical Exam  Constitutional: She is oriented to person, place, and time. She appears well-developed and well-nourished.  Musculoskeletal:  Mod TTP over lumbar paraspinal msc, neg straight leg and Lesegue's, OK ROM of hamstrings +TTP over R greater troch bursa, minimal pain with resisted hip abduction  Neurological: She is alert and oriented to  person, place, and time. She displays normal reflexes. She exhibits normal muscle tone.     UC Treatments / Results  Procedures Procedures  Procedure note: R Greater trochanteric bursa injection Verbal consent obtained. The area of interest was palpated and demarcated with an otoscope speculum. It was cleaned with an alcohol swab. Freeze spray was used. A 27 g needle was inserted at a perpendicular angle through the area of interested. The plunger was withdrawn to ensure our placement was not in a vessel. 2 mL of 2% lidocaine without epi and 40 mg of Kenalog was injected. A bandaid was placed. The patient tolerated the procedure well.  There were no complications noted.     Initial Impression / Assessment and Plan / UC Course  I have reviewed the triage vital signs and the nursing notes.  Pertinent labs & imaging results that were available during my care of the patient were reviewed by me and considered in my medical decision making (see chart for details).     Pt presents with msk concerns, no red flag s/s's in hx or PE. Treat with NSAIDs, injection and hydrocodone for breakthrough pain. Ice. Activity as tolerated. F/u with PCP if no improvement as gluteus med tendinopathy should be considered. Pt voiced understanding and agreement to the plan.  Final Clinical Impressions(s) / UC Diagnoses   Final diagnoses:  Greater trochanteric bursitis of right hip  Acute bilateral low back pain without sciatica    New Prescriptions Discharge Medication List as of 09/02/2017  4:01 PM    START taking these medications   Details  HYDROcodone-acetaminophen (NORCO/VICODIN) 5-325 MG tablet Take 1 tablet by mouth every 6 (six) hours as needed for moderate pain or severe pain., Starting Tue 09/02/2017, Print         Controlled Substance Prescriptions [place] Controlled Substance Registry consulted? No   Sharlene Dory, [place] 09/02/17 1628

## 2017-09-02 NOTE — ED Triage Notes (Signed)
PT reports intermittent lower back pain with shooting pain down right leg.

## 2017-09-12 ENCOUNTER — Telehealth: Payer: Self-pay | Admitting: Adult Health

## 2017-09-12 NOTE — Telephone Encounter (Signed)
Pt called she had a HA x 3 days, was wanting to be seen and have gabapentin refill. Advised she needed to be seen for refill. Pt has been seen in the past for migraines. An appt has been scheduled with Megan, 10.29 @ 9am.   FYI

## 2017-09-12 NOTE — Telephone Encounter (Signed)
I called and LMVM for pt that cancelled appt for 09-15-17 at 0900 and will be in touch to see Dr. Lucia Gaskins for migraines. Pt needs appt with Dr. Lucia Gaskins ? 09-18-17?

## 2017-09-12 NOTE — Telephone Encounter (Signed)
Bethany, we can see patient next week in a work-in spot or an open appt maybe Friday thanks

## 2017-09-15 ENCOUNTER — Ambulatory Visit: Payer: Self-pay | Admitting: Adult Health

## 2017-09-15 NOTE — Telephone Encounter (Signed)
Patient called in and I rescheduled her apt for a 8:30 on Friday as advised by Dr. Lucia Gaskins in previous phone note. She still has a migraine and wants to know if something can be called in while she is waiting on apt. Please call and advise.

## 2017-09-16 NOTE — Telephone Encounter (Signed)
Patient was referred here for headaches but when she came in she said she didn;t want to talk about headaches but she wanted to be evaluated for MS. So we talked about many other symptoms. And we never discussed her headaches and so I cannot treat them, she has to come see me so we can talk about headaches first thanks

## 2017-09-16 NOTE — Telephone Encounter (Signed)
Called and LVM asking for call back.   When she calls, please let her know that Dr. Lucia GaskinsAhern saw her for MS evaluation and not headaches. She says Merry ProudBrandi needs to come see her so they can talk about headaches before she treats them. Please make an appt for her if she agrees.

## 2017-09-17 NOTE — Telephone Encounter (Signed)
Update: Patient has an appt w/ Dr. Lucia Gaskins on Friday 11/2 @ 08:30.

## 2017-09-19 ENCOUNTER — Encounter: Payer: Self-pay | Admitting: Neurology

## 2017-09-19 ENCOUNTER — Ambulatory Visit (INDEPENDENT_AMBULATORY_CARE_PROVIDER_SITE_OTHER): Payer: BLUE CROSS/BLUE SHIELD | Admitting: Neurology

## 2017-09-19 ENCOUNTER — Telehealth: Payer: Self-pay | Admitting: Neurology

## 2017-09-19 VITALS — BP 105/74 | HR 76 | Wt 154.6 lb

## 2017-09-19 DIAGNOSIS — G43001 Migraine without aura, not intractable, with status migrainosus: Secondary | ICD-10-CM | POA: Diagnosis not present

## 2017-09-19 MED ORDER — KETOROLAC TROMETHAMINE 60 MG/2ML IM SOLN
60.0000 mg | Freq: Once | INTRAMUSCULAR | Status: AC
  Administered 2017-09-19: 60 mg via INTRAMUSCULAR

## 2017-09-19 NOTE — Progress Notes (Signed)
Toradol 60 mg injection in LUOQ of buttock. Tolerated well. Bandaid applied. See MAR for med info.  Patient taken to infusion suite by Dr. Lucia Gaskins.

## 2017-09-19 NOTE — Progress Notes (Signed)
Patient appointment cancelled brought to infusion lab for migraine cocktail needs to schedule follow up when not having severe migraine to discuss migrianes

## 2017-09-19 NOTE — Telephone Encounter (Signed)
Dr. Lucia GaskinsAhern notified of pt's report of legs jumping. Patient called and she verbalized understanding that it is a side effect of the Compazine and to take Benadryl 25 mg.

## 2017-09-19 NOTE — Telephone Encounter (Signed)
LVM advising patient that this RN was going to reschedule her FU next week. Advised her that the office phones are now off, but Dr Lucia Gaskins has an opening at 4:30 pm om Tues and Thurs of next week. Advised she call Monday morning to reschedule her FU that is currently on Wed. Left office number.

## 2017-09-19 NOTE — Telephone Encounter (Signed)
Pt has called and is not able to come on 11-07. There is a Botox time slot on 11-05 @ 4pm, if that can be offered to pt please call and offer.  Pt also wants Dr Lucia Gaskins to be made aware pt is having a reaction to cocktail, she states it is causing her legs to jump.  Please call

## 2017-09-22 NOTE — Telephone Encounter (Signed)
Called and LVM asking for return call asap. I am going to offer pt an appt tomorrow 09/23/17 @ 4:00 pm with an arrival time of 3:30 since she is unable to come on Wednesday 11/7.

## 2017-09-22 NOTE — Telephone Encounter (Signed)
Called and spoke with pt. Appt scheduled for tomorrow 11/6 at 4:00 with an arrival time of 3:30. Patient verbalized understanding.

## 2017-09-22 NOTE — Telephone Encounter (Signed)
Called pt to r/s. She states she cannot come on Wednesday at 11:00. I will d/w Dr. Lucia GaskinsAhern today to see what else we can offer patient this week. She states her headaches are not any better.

## 2017-09-23 ENCOUNTER — Ambulatory Visit (INDEPENDENT_AMBULATORY_CARE_PROVIDER_SITE_OTHER): Payer: BLUE CROSS/BLUE SHIELD | Admitting: Neurology

## 2017-09-23 ENCOUNTER — Encounter: Payer: Self-pay | Admitting: Neurology

## 2017-09-23 VITALS — BP 110/75 | HR 85

## 2017-09-23 DIAGNOSIS — G35 Multiple sclerosis: Secondary | ICD-10-CM | POA: Diagnosis not present

## 2017-09-23 DIAGNOSIS — R200 Anesthesia of skin: Secondary | ICD-10-CM

## 2017-09-23 DIAGNOSIS — R202 Paresthesia of skin: Secondary | ICD-10-CM | POA: Diagnosis not present

## 2017-09-23 DIAGNOSIS — H905 Unspecified sensorineural hearing loss: Secondary | ICD-10-CM | POA: Diagnosis not present

## 2017-09-23 DIAGNOSIS — H539 Unspecified visual disturbance: Secondary | ICD-10-CM

## 2017-09-23 DIAGNOSIS — R531 Weakness: Secondary | ICD-10-CM

## 2017-09-23 DIAGNOSIS — R51 Headache with orthostatic component, not elsewhere classified: Secondary | ICD-10-CM

## 2017-09-23 DIAGNOSIS — G35D Multiple sclerosis, unspecified: Secondary | ICD-10-CM

## 2017-09-23 DIAGNOSIS — H9190 Unspecified hearing loss, unspecified ear: Secondary | ICD-10-CM

## 2017-09-23 DIAGNOSIS — G8929 Other chronic pain: Secondary | ICD-10-CM

## 2017-09-23 DIAGNOSIS — R519 Headache, unspecified: Secondary | ICD-10-CM

## 2017-09-23 MED ORDER — FROVATRIPTAN SUCCINATE 2.5 MG PO TABS: ORAL_TABLET | ORAL | 6 refills | Status: DC

## 2017-09-23 MED ORDER — GABAPENTIN 300 MG PO CAPS: 300.0000 mg | ORAL_CAPSULE | Freq: Three times a day (TID) | ORAL | 11 refills | Status: DC

## 2017-09-23 MED ORDER — METHYLPREDNISOLONE 4 MG PO TBPK: ORAL_TABLET | ORAL | 1 refills | Status: DC

## 2017-09-23 NOTE — Progress Notes (Signed)
[facility]    Provider:  Dr Jaynee Eagles Referring Provider: Elroy, [place] A* Primary Care Physician:  [facility]  CC:  Muscle aches, joint pain, fatigue, MS, intractable headache  Interval history 09/23/2017: Migraines since childhood  But worsened in April, no inciting event, she stopped drinking softdrinks but then they improved. Last 3 months worsening. She has daily headaches. Some days they are worse than others. She has pounding on the right side of the head, pulsating, she gets a black line sometimes in the left eye but no consistent aura, behind the eye, she has light sensitivity when they are moderate to severe, nausea, occ she has to sit in a dark room, if sh emoves it makes it worse. She has had a migraine for 2 weeks, improved with the migraine cocktail, she had a reaction to compazine. Daily they are moderate in severity and they can get 8-9/10, 1-2 days a week can be severe, she has missed work, No medication overuse, No aura, ongoing since April. She does not take hydrocodone often. After breast reduction, discussed integrative therapies.   Medications tried: Gabapentin, Lamotrigine, wellbutrin, abilify, lexapro,   HPI:  Erica Larson is a 41 y.o. female here as a referral from Dr. Gerarda Fraction. Past medical history of obesity, hypothyroidism, polyarthralgia and myalgia, chronic anxiety, osteoarthritis. Family history of lupus. Patient says she is her for  evaluation of MS. She has numbness and tinlging in the botom of the feet. She is off balance. Her joints are aching. She feels exhausted. Underneath her nose and across the nose she had a rash on her face like in Lupus. Symptoms started 6 weeks ago, she started getting more tired and her bones and joints started aching in her legs.  Pain in the joints and up into the arms and in the anteior thighs. She feels like someone is stabbing the bottom of her heet in the morning. She has had the tinlging  in her feet off and on for the last several years without progression but all other symptoms are new. Also with Muscle aching and  generalized weakness. She had an episode of slurred speech, not in the setting of a headache, she hadn;t felt good that day and all of a sudden her words wouldn;t come out, she went ot the [facility] urgent care and they said she had weakness on one side. Her mother has MS and sees Dr. Jannifer Larson in our group. Patient has vision changes in the right eye. She also has headaches. She was very sick for 5 months previous to symptoms, sinus problems. No other focal neurologic deficits or complaints. Patient has headaches and these have not significantly changed, these ar not concerning to her she is more worried about her recent symptoms.  Reviewed notes, labs and imaging from outside physicians, which showed:  Patient was seen at [facility] on 05/31/2016 for numbness in both sides legs and arms, headaches, blurred vision and being tired for 3 days. She also reported blurring of vision, dizziness, numbness and tingling in the left upper extremity, numbness and tingling in the right upper chin many, fatigue, lethargic, feeling of being drunk. Examination including head, eyes, ears, nose, throat, neck, cardiovascular, lungs, abdomen, skin, extremities and neurologic workup were negative. Patient was sent to the emergency room.( I don't have records from the emergency room, will request.)  BUN 17, creatinine is 0.740 labs drawn 06/04/2016.  Review primary care notes. At last appointment at primary care  it appears patient had extensive testing ordered including ANA, CBC, CMP, ESR, folate, Lyme, lipid, rheumatoid factor, thyroid, B12, vitamin D, bilirubin. (I don't have results, we'll request, per patient results are normal)  Review previous notes in Epic. Previous history of overdosing on medications including clonazepam, Ambien and Requip at the end of 2016. She  was admitted to the [facility] unit for 2 days. She was diagnosed as possibly bipolar d/o. This is an unclear diagnosis. Her Latuda was later changed to Lexapro and Wellbutrin and she enrolled in counseling and recent diagnoses include depression and generalized anxiety and not bipolar disorder. Patient has been treated for mood disorder since 2014.  Review of Systems: Patient complains of symptoms per HPI as well as the following symptoms: headache. Pertinent negatives and positives per HPI. All others negative.   Social History   Socioeconomic History  . Marital status: Married    Spouse name: Erica Larson  . Number of children: 1  . Years of education: Some college  . Highest education level: Not on file  Social Needs  . Financial resource strain: Not on file  . Food insecurity - worry: Not on file  . Food insecurity - inability: Not on file  . Transportation needs - medical: Not on file  . Transportation needs - non-medical: Not on file  Occupational History  . Occupation: Engineer, maintenance  . Occupation: Private sitting  Tobacco Use  . Smoking status: Never Smoker  . Smokeless tobacco: Never Used  Substance and Sexual Activity  . Alcohol use: No  . Drug use: No  . Sexual activity: Yes    Birth control/protection: Inserts, IUD  Other Topics Concern  . Not on file  Social History Narrative   Lives with husband   hhof 3 married non smoker     Daughter husband and pet dog   Caffeine use: Diet coke    Family History  Problem Relation Age of Onset  . Thyroid disease Mother   . Nephrolithiasis Mother   . Urolithiasis Mother   . Heart attack Mother        signs but neg eval  . Nephrolithiasis Father   . Urolithiasis Father   . Anxiety disorder Father   . Drug abuse Father   . Thyroid disease Sister   . Heart disease Maternal Grandmother     Past Medical History:  Diagnosis Date  . Acne   . Amenorrhea   . Anxiety   . Depression   . Infertility, female     treatment with oligammenorhea  Dr Carren Rang   . Migraine   . Restless legs syndrome   . Thyroiditis     Past Surgical History:  Procedure Laterality Date  . DILATION AND CURETTAGE OF UTERUS      Current Outpatient Medications  Medication Sig Dispense Refill  . HYDROcodone-acetaminophen (NORCO/VICODIN) 5-325 MG tablet Take 1 tablet by mouth every 6 (six) hours as needed for moderate pain or severe pain. 10 tablet 0  . LAMOTRIGINE PO Take 50 mg by mouth daily.    Marland Kitchen levothyroxine (SYNTHROID, LEVOTHROID) 25 MCG tablet Take 50 mcg by mouth.     Marland Kitchen rOPINIRole (REQUIP) 1 MG tablet Take 1 mg by mouth at bedtime.    . frovatriptan (FROVA) 2.5 MG tablet Take one pill daily for 5 days 5 tablet 6  . gabapentin (NEURONTIN) 300 MG capsule Take 1 capsule (300 mg total) 3 (three) times daily by mouth. 90 capsule 11  . methylPREDNISolone (MEDROL DOSEPAK)  4 MG TBPK tablet Take pills daily all together in the morning with food for 6 days 21 tablet 1  . naratriptan (AMERGE) 2.5 MG tablet Take one tablet twice daily for 5 days. 10 tablet 0   No current facility-administered medications for this visit.     Allergies as of 09/23/2017 - Review Complete 09/23/2017  Allergen Reaction Noted  . Compazine [prochlorperazine] Other (See Comments) 09/23/2017    Vitals: BP 110/75 (BP Location: Right Arm, Patient Position: Sitting)   Pulse 85  Last Weight:  Wt Readings from Last 1 Encounters:  09/19/17 154 lb 9.6 oz (70.1 kg)   Last Height:   Ht Readings from Last 1 Encounters:  09/02/17 5' 6"  (1.676 m)     Physical exam: Exam: Gen: NAD, conversant, well nourised, well groomed                     CV: RRR, no MRG. No Carotid Bruits. No peripheral edema, warm, nontender Eyes: Conjunctivae clear without exudates or hemorrhage  Neuro: Detailed Neurologic Exam  Speech:    Speech is normal; fluent and spontaneous with normal comprehension.  Cognition:    The patient is oriented to person, place, and  time;     recent and remote memory intact;     language fluent;     normal attention, concentration,     fund of knowledge Cranial Nerves:    The pupils are equal, round, and reactive to light. The fundi are normal and spontaneous venous pulsations are present. Visual fields are full to finger confrontation. Extraocular movements are intact. Trigeminal sensation is intact and the muscles of mastication are normal. The face is symmetric. The palate elevates in the midline. Hearing intact. Voice is normal. Shoulder shrug is normal. The tongue has normal motion without fasciculations.   Coordination:    Normal finger to nose and heel to shin. Normal rapid alternating movements.   Gait:    Heel-toe and tandem gait are normal.   Motor Observation:    No asymmetry, no atrophy, and no involuntary movements noted. Tone:    Normal muscle tone.    Posture:    Posture is normal. normal erect    Strength:    Strength is V/V in the upper and lower limbs.      Sensation: hemisensory loss left     Reflex Exam:  DTR's:    Deep tendon reflexes in the upper and lower extremities are normal bilaterally.   Toes:    The toes are downgoing bilaterally.   Clonus:    Clonus is absent.   Assessment/Plan:  41 y.o. female here as a referral from Dr. Gerarda Fraction. Past medical history of obesity, hypothyroidism, polyarthralgia and myalgia, chronic anxiety, osteoarthritis, intractable positional headache,. Family history of lupus.  Patient with acute continuous headache, intractable, positional in quality with a Hx of MS, paresthesias, vision changes, weakness, . Need MRI of the brain to evaluate for new lesion, MS, space occupying mass especially given positional quality and MRI cervical spine to eval for new event in MS for paresthesias, weakness, aphasia. Frova x 5 days for headaches which may be migraines if above unremarkable.  Emg/ncs right arm and leg for paresthesias, weakness  Steroids and frova x 6  days, gabapentin  Cc: Dr. Gerarda Fraction   Orders Placed This Encounter  Procedures  . MR BRAIN W WO CONTRAST  . MR CERVICAL SPINE W WO CONTRAST  . NCV with EMG(electromyography)      Berta Minor  Jaynee Eagles, [facility] [address] [place], [place] [address]  Phone [phone] Fax [phone]  A total of 40 minutes was spent face-to-face with this patient. Over half this time was spent on counseling patient on the following items:   Problem List Items Addressed This Visit    None    Visit Diagnoses    Numbness and tingling    -  Primary   Relevant Orders   NCV with EMG(electromyography)   MR BRAIN W WO CONTRAST   MR CERVICAL SPINE W WO CONTRAST   Multiple sclerosis ([place])       Relevant Orders   MR BRAIN W WO CONTRAST   MR CERVICAL SPINE W WO CONTRAST   Positional headache       Relevant Medications   gabapentin (NEURONTIN) 300 MG capsule   frovatriptan (FROVA) 2.5 MG tablet   Other Relevant Orders   MR BRAIN W WO CONTRAST   MR CERVICAL SPINE W WO CONTRAST   Chronic intractable headache, unspecified headache type       Relevant Medications   gabapentin (NEURONTIN) 300 MG capsule   frovatriptan (FROVA) 2.5 MG tablet   Other Relevant Orders   MR BRAIN W WO CONTRAST   MR CERVICAL SPINE W WO CONTRAST   Vision changes       Relevant Orders   MR BRAIN W WO CONTRAST   MR CERVICAL SPINE W WO CONTRAST   Perceived hearing changes       Relevant Orders   MR BRAIN W WO CONTRAST   MR CERVICAL SPINE W WO CONTRAST   Weakness       Relevant Orders   MR BRAIN W WO CONTRAST   MR CERVICAL SPINE W WO CONTRAST    diagnosis and different diagnostic and therapeutic options available.

## 2017-09-23 NOTE — Patient Instructions (Signed)
Frova x 5 days Steroids x 6 days Start Gabapentin MRI of the brain and cervical spine EMG/NCS Gabapentin capsules or tablets What is this medicine? GABAPENTIN (GA ba pen tin) is used to control partial seizures in adults with epilepsy. It is also used to treat certain types of nerve pain. This medicine may be used for other purposes; ask your health care provider or pharmacist if you have questions. COMMON BRAND NAME(S): Active-PAC with Gabapentin, Gabarone, Neurontin What should I tell my health care provider before I take this medicine? They need to know if you have any of these conditions: -kidney disease -suicidal thoughts, plans, or attempt; a previous suicide attempt by you or a family member -an unusual or allergic reaction to gabapentin, other medicines, foods, dyes, or preservatives -pregnant or trying to get pregnant -breast-feeding How should I use this medicine? Take this medicine by mouth with a glass of water. Follow the directions on the prescription label. You can take it with or without food. If it upsets your stomach, take it with food.Take your medicine at regular intervals. Do not take it more often than directed. Do not stop taking except on your doctor's advice. If you are directed to break the 600 or 800 mg tablets in half as part of your dose, the extra half tablet should be used for the next dose. If you have not used the extra half tablet within 28 days, it should be thrown away. A special MedGuide will be given to you by the pharmacist with each prescription and refill. Be sure to read this information carefully each time. Talk to your pediatrician regarding the use of this medicine in children. Special care may be needed. Overdosage: If you think you have taken too much of this medicine contact a poison control center or emergency room at once. NOTE: This medicine is only for you. Do not share this medicine with others. What if I miss a dose? If you miss a dose,  take it as soon as you can. If it is almost time for your next dose, take only that dose. Do not take double or extra doses. What may interact with this medicine? Do not take this medicine with any of the following medications: -other gabapentin products This medicine may also interact with the following medications: -alcohol -antacids -antihistamines for allergy, cough and cold -certain medicines for anxiety or sleep -certain medicines for depression or psychotic disturbances -homatropine; hydrocodone -naproxen -narcotic medicines (opiates) for pain -phenothiazines like chlorpromazine, mesoridazine, prochlorperazine, thioridazine This list may not describe all possible interactions. Give your health care provider a list of all the medicines, herbs, non-prescription drugs, or dietary supplements you use. Also tell them if you smoke, drink alcohol, or use illegal drugs. Some items may interact with your medicine. What should I watch for while using this medicine? Visit your doctor or health care professional for regular checks on your progress. You may want to keep a record at home of how you feel your condition is responding to treatment. You may want to share this information with your doctor or health care professional at each visit. You should contact your doctor or health care professional if your seizures get worse or if you have any new types of seizures. Do not stop taking this medicine or any of your seizure medicines unless instructed by your doctor or health care professional. Stopping your medicine suddenly can increase your seizures or their severity. Wear a medical identification bracelet or chain if you are taking  this medicine for seizures, and carry a card that lists all your medications. You may get drowsy, dizzy, or have blurred vision. Do not drive, use machinery, or do anything that needs mental alertness until you know how this medicine affects you. To reduce dizzy or fainting  spells, do not sit or stand up quickly, especially if you are an older patient. Alcohol can increase drowsiness and dizziness. Avoid alcoholic drinks. Your mouth may get dry. Chewing sugarless gum or sucking hard candy, and drinking plenty of water will help. The use of this medicine may increase the chance of suicidal thoughts or actions. Pay special attention to how you are responding while on this medicine. Any worsening of mood, or thoughts of suicide or dying should be reported to your health care professional right away. Women who become pregnant while using this medicine may enroll in the [place] [employer] Antiepileptic Drug Pregnancy Registry by calling [phone]. This registry collects information about the safety of antiepileptic drug use during pregnancy. What side effects may I notice from receiving this medicine? Side effects that you should report to your doctor or health care professional as soon as possible: -allergic reactions like skin rash, itching or hives, swelling of the face, lips, or tongue -worsening of mood, thoughts or actions of suicide or dying Side effects that usually do not require medical attention (report to your doctor or health care professional if they continue or are bothersome): -constipation -difficulty walking or controlling muscle movements -dizziness -nausea -slurred speech -tiredness -tremors -weight gain This list may not describe all possible side effects. Call your doctor for medical advice about side effects. You may report side effects to FDA at 1-800-FDA-1088. Where should I keep my medicine? Keep out of reach of children. This medicine may cause accidental overdose and death if it taken by other adults, children, or pets. Mix any unused medicine with a substance like cat litter or coffee grounds. Then throw the medicine away in a sealed container like a sealed bag or a coffee can with a lid. Do not use the medicine after the expiration  date. Store at room temperature between 15 and 30 degrees C (59 and 86 degrees F). NOTE: This sheet is a summary. It may not cover all possible information. If you have questions about this medicine, talk to your doctor, pharmacist, or health care provider.  2018 Elsevier/Gold Standard (2013-12-31 15:26:50)  Methylprednisolone tablets What is this medicine? METHYLPREDNISOLONE (meth ill pred NISS oh lone) is a corticosteroid. It is commonly used to treat inflammation of the skin, joints, lungs, and other organs. Common conditions treated include asthma, allergies, and arthritis. It is also used for other conditions, such as blood disorders and diseases of the adrenal glands. This medicine may be used for other purposes; ask your health care provider or pharmacist if you have questions. COMMON BRAND NAME(S): Medrol, Medrol Dosepak What should I tell my health care provider before I take this medicine? They need to know if you have any of these conditions: -Cushing's syndrome -eye disease, vision problems -diabetes -glaucoma -heart disease -high blood pressure -infection (especially a virus infection such as chickenpox, cold sores, or herpes) -liver disease -mental illness -myasthenia gravis -osteoporosis -recently received or scheduled to receive a vaccine -seizures -stomach or intestine problems -thyroid disease -an unusual or allergic reaction to lactose, methylprednisolone, other medicines, foods, dyes, or preservatives -pregnant or trying to get pregnant -breast-feeding How should I use this medicine? Take this medicine by mouth with a glass of water.  Follow the directions on the prescription label. Take this medicine with food. If you are taking this medicine once a day, take it in the morning. Do not take it more often than directed. Do not suddenly stop taking your medicine because you may develop a severe reaction. Your doctor will tell you how much medicine to take. If your  doctor wants you to stop the medicine, the dose may be slowly lowered over time to avoid any side effects. Talk to your pediatrician regarding the use of this medicine in children. Special care may be needed. Overdosage: If you think you have taken too much of this medicine contact a poison control center or emergency room at once. NOTE: This medicine is only for you. Do not share this medicine with others. What if I miss a dose? If you miss a dose, take it as soon as you can. If it is almost time for your next dose, talk to your doctor or health care professional. You may need to miss a dose or take an extra dose. Do not take double or extra doses without advice. What may interact with this medicine? Do not take this medicine with any of the following medications: -alefacept -echinacea -live virus vaccines -metyrapone -mifepristone This medicine may also interact with the following medications: -amphotericin B -aspirin and aspirin-like medicines -certain antibiotics like erythromycin, clarithromycin, troleandomycin -certain medicines for diabetes -certain medicines for fungal infections like ketoconazole -certain medicines for seizures like carbamazepine, phenobarbital, phenytoin -certain medicines that treat or prevent blood clots like warfarin -cholestyramine -cyclosporine -digoxin -diuretics -female hormones, like estrogens and birth control pills -isoniazid -NSAIDs, medicines for pain inflammation, like ibuprofen or naproxen -other medicines for myasthenia gravis -rifampin -vaccines This list may not describe all possible interactions. Give your health care provider a list of all the medicines, herbs, non-prescription drugs, or dietary supplements you use. Also tell them if you smoke, drink alcohol, or use illegal drugs. Some items may interact with your medicine. What should I watch for while using this medicine? Tell your doctor or healthcare professional if your symptoms do  not start to get better or if they get worse. Do not stop taking except on your doctor's advice. You may develop a severe reaction. Your doctor will tell you how much medicine to take. This medicine may increase your risk of getting an infection. Tell your doctor or health care professional if you are around anyone with measles or chickenpox, or if you develop sores or blisters that do not heal properly. This medicine may affect blood sugar levels. If you have diabetes, check with your doctor or health care professional before you change your diet or the dose of your diabetic medicine. Tell your doctor or health care professional right away if you have any change in your eyesight. Using this medicine for a long time may increase your risk of low bone mass. Talk to your doctor about bone health. What side effects may I notice from receiving this medicine? Side effects that you should report to your doctor or health care professional as soon as possible: -allergic reactions like skin rash, itching or hives, swelling of the face, lips, or tongue -bloody or tarry stools -changes in vision -hallucination, loss of contact with reality -muscle cramps -muscle pain -palpitations -signs and symptoms of high blood sugar such as dizziness; dry mouth; dry skin; fruity breath; nausea; stomach pain; increased hunger or thirst; increased urination -signs and symptoms of infection like fever or chills; cough; sore throat; pain  or trouble passing urine -trouble passing urine or change in the amount of urine Side effects that usually do not require medical attention (report to your doctor or health care professional if they continue or are bothersome): -changes in emotions or mood -constipation -diarrhea -excessive hair growth on the face or body -headache -nausea, vomiting -trouble sleeping -weight gain This list may not describe all possible side effects. Call your doctor for medical advice about side  effects. You may report side effects to FDA at 1-800-FDA-1088. Where should I keep my medicine? Keep out of the reach of children. Store at room temperature between 20 and 25 degrees C (68 and 77 degrees F). Throw away any unused medicine after the expiration date. NOTE: This sheet is a summary. It may not cover all possible information. If you have questions about this medicine, talk to your doctor, pharmacist, or health care provider.  2018 Elsevier/Gold Standard (2016-01-11 15:53:30)  Frovatriptan tablets What is this medicine? FROVATRIPTAN (froe va TRIP tan) is used to treat migraines with or without aura. An aura is a strange feeling or visual disturbance that warns you of an attack. It is not used to prevent migraines. This medicine may be used for other purposes; ask your health care provider or pharmacist if you have questions. COMMON BRAND NAME(S): Frova What should I tell my health care provider before I take this medicine? They need to know if you have any of these conditions: -bowel disease or colitis -diabetes -family history of heart disease -fast or irregular heart beat -heart or blood vessel disease, angina (chest pain), or previous heart attack -high blood pressure -high cholesterol -history of stroke, transient ischemic attacks (TIAs or mini-strokes), or intracranial bleeding -kidney or liver disease -overweight -poor circulation -postmenopausal or surgical removal of uterus and ovaries -Raynaud's disease -seizure disorder -an unusual or allergic reaction to frovatriptan, other medicines, foods, dyes, or preservatives -pregnant or trying to get pregnant -breast-feeding How should I use this medicine? Take this medicine by mouth with a glass of water. Follow the directions on the prescription label. This medicine is taken at the first symptoms of a migraine. It is not for everyday use. If your migraine headache returns after one dose, you can take another dose as  directed. You must allow at least 2 hours between doses, and do not take more than 3 tablets (7.5 mg) in 24 hours. If there is no improvement at all after the first dose, do not take a second dose without talking to your doctor or health care professional. Do not take your medicine more often than directed. Talk to your pediatrician regarding the use of this medicine in children. Special care may be needed. Overdosage: If you think you have taken too much of this medicine contact a poison control center or emergency room at once. NOTE: This medicine is only for you. Do not share this medicine with others. What if I miss a dose? This does not apply; this medicine is not for regular use. What may interact with this medicine? Do not take this medicine with any of the following medicines: -amphetamine, dextroamphetamine or cocaine -dihydroergotamine, ergotamine, ergoloid mesylates, methysergide, or ergot-type medication - do not take within 24 hours of taking frovatriptan -feverfew -MAOIs like Carbex, Eldepryl, Marplan, Nardil, and Parnate - do not take frovatriptan within 2 weeks of stopping MAOI therapy -other migraine medicines like almotriptan, eletriptan, naratriptan, rizatriptan, zolmitriptan - do not take within 24 hours of taking frovatriptan -tryptophan This medicine may also interact  with the following medications: -female hormones, like estrogens or progestins and birth control pills -medicines for mental depression, anxiety or mood problems -propranolol This list may not describe all possible interactions. Give your health care provider a list of all the medicines, herbs, non-prescription drugs, or dietary supplements you use. Also tell them if you smoke, drink alcohol, or use illegal drugs. Some items may interact with your medicine. What should I watch for while using this medicine? Only take this medicine for a migraine headache. Take it if you get warning symptoms or at the start of a  migraine attack. It is not for regular use to prevent migraine attacks. You may get drowsy or dizzy. Do not drive, use machinery, or do anything that needs mental alertness until you know how this medicine affects you. To reduce dizzy or fainting spells, do not sit or stand up quickly, especially if you are an older patient. Alcohol can increase drowsiness, dizziness and flushing. Avoid alcoholic drinks. Smoking cigarettes may increase the risk of heart-related side effects from using this medicine. If you take migraine medicines for 10 or more days a month, your migraines may get worse. Keep a diary of headache days and medicine use. Contact your healthcare professional if your migraine attacks occur more frequently. What side effects may I notice from receiving this medicine? Side effects that you should report to your doctor or health care professional as soon as possible: -allergic reactions like skin rash, itching or hives, swelling of the face, lips, or tongue -chest or throat pain, tightness -fast, slow, or irregular heart beat -increased or decreased blood pressure -loss of vision or vision changes -seizures -severe stomach pain and cramping, bloody diarrhea -shortness of breath, wheezing, or difficulty breathing -tingling, pain, or numbness in the face, hands or feet Side effects that usually do not require medical attention (report to your doctor or health care professional if they continue or are bothersome): -drowsiness -feeling warm, flushing, or redness of the face -headache -muscle pain or cramps -nausea, vomiting, diarrhea or stomach upset -tiredness or weakness This list may not describe all possible side effects. Call your doctor for medical advice about side effects. You may report side effects to FDA at 1-800-FDA-1088. Where should I keep my medicine? Keep out of the reach of children. Store at room temperature between 20 and 25 degrees C (68 and 77 degrees F). Protect  from light and moisture. Throw away any unused medicine after the expiration date. NOTE: This sheet is a summary. It may not cover all possible information. If you have questions about this medicine, talk to your doctor, pharmacist, or health care provider.  2018 Elsevier/Gold Standard (2013-07-06 10:06:08)

## 2017-09-24 ENCOUNTER — Telehealth: Payer: Self-pay | Admitting: Neurology

## 2017-09-24 ENCOUNTER — Other Ambulatory Visit: Payer: Self-pay | Admitting: Neurology

## 2017-09-24 ENCOUNTER — Ambulatory Visit: Payer: Self-pay | Admitting: Neurology

## 2017-09-24 MED ORDER — NARATRIPTAN HCL 2.5 MG PO TABS: ORAL_TABLET | ORAL | 0 refills | Status: DC

## 2017-09-24 NOTE — Telephone Encounter (Signed)
I called in Amerge instead, I called it in to her primary pharmacy hope that is right

## 2017-09-24 NOTE — Telephone Encounter (Signed)
Called patient to verify if she is ok with [facility] which Amerge was sent to. LVM asking for call back, did not leave details.

## 2017-09-24 NOTE — Progress Notes (Signed)
I have called in a different prescription, thanks

## 2017-09-24 NOTE — Telephone Encounter (Signed)
Erica Larson with [facility] is calling to see if Rx frovatriptan (FROVA) 2.5 MG tablet can be changed due to the cost of patient's co-pay.

## 2017-10-22 ENCOUNTER — Encounter (HOSPITAL_COMMUNITY): Payer: Self-pay | Admitting: Psychiatry

## 2017-10-22 ENCOUNTER — Ambulatory Visit: Payer: Self-pay | Admitting: Neurology

## 2017-10-22 ENCOUNTER — Other Ambulatory Visit (HOSPITAL_COMMUNITY): Payer: BLUE CROSS/BLUE SHIELD | Attending: Psychiatry | Admitting: Psychiatry

## 2017-10-22 DIAGNOSIS — F3181 Bipolar II disorder: Secondary | ICD-10-CM | POA: Insufficient documentation

## 2017-10-22 MED ORDER — LAMOTRIGINE 25 MG PO TABS: 25.0000 mg | ORAL_TABLET | Freq: Every day | ORAL | 2 refills | Status: DC

## 2017-10-22 NOTE — Progress Notes (Signed)
Psychiatric Initial Adult Assessment   Patient Identification: Erica Larson MRN:  [id number] Date of Evaluation:  10/22/2017 Referral Source: self Chief Complaint:depression  Visit Diagnosis: bipolar 2,major depression type  History of Present Illness:  [name] was in this IOP a year ago for similar reasons.  She has continued to struggle with her relationship with her husband and has been in serious therapy including residential treatment all telling her that the relationship is not working and she needs to be out of it.  She still cannot do that event though he continues giving her reasons to leave.  She remains depressed to the point of daily unhappiness, crying, feelings that nothing will change, trouble sleeping and feeling guilty that she cannot get better.  Still no evidence of a manic episode but mostly depression and personality disorder traits probably stemming from her childhood as outlined in the last visit.  Associated Signs/Symptoms: Depression Symptoms:  depressed mood, anhedonia, insomnia, fatigue, feelings of worthlessness/guilt, hopelessness, anxiety, (Hypo) Manic Symptoms:  Irritable Mood, Anxiety Symptoms:  Excessive Worry, Psychotic Symptoms:  none PTSD Symptoms: Negative  Past Psychiatric History: ongoing inpatient stays and outpatient treatment over the years  Previous Psychotropic Medications: Yes   Substance Abuse History in the last 12 months:  No.  Consequences of Substance Abuse: Negative  Past Medical History:  Past Medical History:  Diagnosis Date  . Acne   . Amenorrhea   . Anxiety   . Depression   . Infertility, female    treatment with oligammenorhea  Dr Chevis Pretty   . Migraine   . Restless legs syndrome   . Thyroiditis     Past Surgical History:  Procedure Laterality Date  . DILATION AND CURETTAGE OF UTERUS      Family Psychiatric History: father drugs addiction  Family History:  Family History  Problem Relation Age of Onset  .  Thyroid disease Mother   . Nephrolithiasis Mother   . Urolithiasis Mother   . Heart attack Mother        signs but neg eval  . Nephrolithiasis Father   . Urolithiasis Father   . Anxiety disorder Father   . Drug abuse Father   . Thyroid disease Sister   . Heart disease Maternal Grandmother     Social History:   Social History   Socioeconomic History  . Marital status: Married    Spouse name: Elige Radon  . Number of children: 1  . Years of education: Some college  . Highest education level: None  Social Needs  . Financial resource strain: None  . Food insecurity - worry: None  . Food insecurity - inability: None  . Transportation needs - medical: None  . Transportation needs - non-medical: None  Occupational History  . Occupation: Network engineer  . Occupation: Private sitting  Tobacco Use  . Smoking status: Never Smoker  . Smokeless tobacco: Never Used  Substance and Sexual Activity  . Alcohol use: No  . Drug use: No  . Sexual activity: Yes    Birth control/protection: Inserts, IUD  Other Topics Concern  . None  Social History Narrative   Lives with husband   hhof 3 married non smoker     Daughter husband and pet dog   Caffeine use: Diet coke    Additional Social History: 38 year old daughter.  Has been unable to work for this past year due to the mental health issues  Allergies:   Allergies  Allergen Reactions  . Compazine [Prochlorperazine] Other (See  Comments)    Legs jumping     Metabolic Disorder Labs: No results found for: HGBA1C, MPG No results found for: PROLACTIN No results found for: CHOL, TRIG, HDL, CHOLHDL, VLDL, LDLCALC   Current Medications: Current Outpatient Medications  Medication Sig Dispense Refill  . frovatriptan (FROVA) 2.5 MG tablet Take one pill daily for 5 days 5 tablet 6  . gabapentin (NEURONTIN) 300 MG capsule Take 1 capsule (300 mg total) 3 (three) times daily by mouth. 90 capsule 11  . HYDROcodone-acetaminophen (NORCO/VICODIN)  5-325 MG tablet Take 1 tablet by mouth every 6 (six) hours as needed for moderate pain or severe pain. 10 tablet 0  . LAMOTRIGINE PO Take 50 mg by mouth daily.    Marland Kitchen. levothyroxine (SYNTHROID, LEVOTHROID) 25 MCG tablet Take 50 mcg by mouth.     . methylPREDNISolone (MEDROL DOSEPAK) 4 MG TBPK tablet Take pills daily all together in the morning with food for 6 days 21 tablet 1  . naratriptan (AMERGE) 2.5 MG tablet Take one tablet twice daily for 5 days. 10 tablet 0  . rOPINIRole (REQUIP) 1 MG tablet Take 1 mg by mouth at bedtime.     No current facility-administered medications for this visit.     Neurologic: Headache: Negative Seizure: Negative Paresthesias:Negative  Musculoskeletal: Strength & Muscle Tone: within normal limits Gait & Station: normal Patient leans: N/A  Psychiatric Specialty Exam: ROS  There were no vitals taken for this visit.There is no height or weight on file to calculate BMI.  General Appearance: Fairly Groomed  Eye Contact:  Good  Speech:  Clear and Coherent  Volume:  Normal  Mood:  Anxious and Depressed  Affect:  Congruent  Thought Process:  Coherent and Goal Directed  Orientation:  Full (Time, Place, and Person)  Thought Content:  Logical  Suicidal Thoughts:  No  Homicidal Thoughts:  No  Memory:  Immediate;   Good Recent;   Good Remote;   Good  Judgement:  Intact  Insight:  Shallow  Psychomotor Activity:  Normal  Concentration:  Concentration: Good and Attention Span: Good  Recall:  Good  Fund of Knowledge:Good  Language: Good  Akathisia:  Negative  Handed:  Right  AIMS (if indicated):  0  Assets:  ArchitectCommunication Skills Financial Resources/Insurance Housing Intimacy Physical Health Transportation Vocational/Educational  ADL's:  Intact  Cognition: WNL  Sleep:  poor    Treatment Plan Summary: Admit to IOP with daily group therapy.  Will increase lamotrigine to 175 mg as 150 seems not quite enough.  When it was last attempted she had a  rash but not Stevens-Johnson type.  Carolanne GrumblingGerald Briston Lax, MD 12/5/20181:39 PM
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
## 2017-10-23 ENCOUNTER — Other Ambulatory Visit (HOSPITAL_COMMUNITY): Payer: BLUE CROSS/BLUE SHIELD | Admitting: Psychiatry

## 2017-10-23 DIAGNOSIS — F3181 Bipolar II disorder: Secondary | ICD-10-CM | POA: Diagnosis not present

## 2017-10-23 NOTE — Progress Notes (Signed)
    Daily Group Progress Note  Program: IOP  Group Time: 9:00-12:00  Participation Level: Active  Behavioral Response: Appropriate  Type of Therapy:  Group Therapy  Summary of Progress:  Pt. Presented as calm, engaged in the group process. Pt. 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 Pt. Discussed her fears of being alone and staying in a bad marriage out of her fears of being alone. Pt. Was challenged by the counselor and the rest of the group to find ways to gently lean into her fears of being alone with support. Pt. Participated in breath focused 4-3-8 meditation exercise. Pt. Participated in yoga therapy with Forde Radon, LPC.      Shaune Pollack, LPC

## 2017-10-24 ENCOUNTER — Other Ambulatory Visit (HOSPITAL_COMMUNITY): Payer: BLUE CROSS/BLUE SHIELD

## 2017-10-24 NOTE — Progress Notes (Signed)
    Daily Group Progress Note  Program: IOP Group Time: 9:00-12:00   Participation Level: Active   Behavioral Response: Appropriate   Type of Therapy:  Group Therapy   Summary of Progress: Pt presented as engaged.  Pt described the events of her past year, since her last time in IOP.  Pt said she separated from her husband, then got back together with him and had a series of moving in and out of the house with him.  Pt spent six weeks at a mental health rehabilitation center in [place], then followed her counselor from there to [place], to continue services.  Pt said her husband is still seeing his 39 year old girlfriend.  Pt expressed feelings of isolation and worthlessness.  Pt listened to and engaged in guest presentation about essentials oils and mental health.  Shaune Pollack, LPC

## 2017-10-27 ENCOUNTER — Other Ambulatory Visit (HOSPITAL_COMMUNITY): Payer: BLUE CROSS/BLUE SHIELD

## 2017-10-28 ENCOUNTER — Other Ambulatory Visit (HOSPITAL_COMMUNITY): Payer: BLUE CROSS/BLUE SHIELD

## 2017-10-29 ENCOUNTER — Telehealth: Payer: Self-pay | Admitting: Neurology

## 2017-10-29 ENCOUNTER — Other Ambulatory Visit (HOSPITAL_COMMUNITY): Payer: BLUE CROSS/BLUE SHIELD | Admitting: Psychiatry

## 2017-10-29 DIAGNOSIS — F3181 Bipolar II disorder: Secondary | ICD-10-CM

## 2017-10-29 MED ORDER — ARIPIPRAZOLE 5 MG PO TABS: 5.0000 mg | ORAL_TABLET | Freq: Every day | ORAL | 2 refills | Status: DC
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
## 2017-10-29 NOTE — Telephone Encounter (Signed)
Patient is calling to schedule MRI. She would like this scheduled before the end of the year.

## 2017-10-29 NOTE — Progress Notes (Signed)
Patient ID: Erica Larson, female   DOB: 06/16/1976, 41 y.o.   MRN: [id number]   Complains of irritability in addition to her moodiness.  No patience.  Aripiprazole seemed to help when she was taking it so we will re start that at 5 mg daily.

## 2017-10-29 NOTE — Telephone Encounter (Signed)
Left a voicemail for patient to cal me back about scheduling the MRI.

## 2017-10-30 ENCOUNTER — Other Ambulatory Visit (HOSPITAL_COMMUNITY): Payer: BLUE CROSS/BLUE SHIELD | Admitting: Psychiatry

## 2017-10-30 DIAGNOSIS — F3181 Bipolar II disorder: Secondary | ICD-10-CM | POA: Diagnosis not present

## 2017-10-31 ENCOUNTER — Other Ambulatory Visit (HOSPITAL_COMMUNITY): Payer: BLUE CROSS/BLUE SHIELD
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
## 2017-11-03 ENCOUNTER — Other Ambulatory Visit (HOSPITAL_COMMUNITY): Payer: BLUE CROSS/BLUE SHIELD | Admitting: Psychiatry

## 2017-11-03 ENCOUNTER — Ambulatory Visit (HOSPITAL_COMMUNITY)
Admission: EM | Admit: 2017-11-03 | Discharge: 2017-11-03 | Disposition: A | Discharge status: Home / Self Care | Payer: BLUE CROSS/BLUE SHIELD | Attending: Emergency Medicine | Admitting: Emergency Medicine

## 2017-11-03 ENCOUNTER — Encounter (HOSPITAL_COMMUNITY): Payer: Self-pay | Admitting: Emergency Medicine

## 2017-11-03 DIAGNOSIS — M7061 Trochanteric bursitis, right hip: Secondary | ICD-10-CM

## 2017-11-03 DIAGNOSIS — F3181 Bipolar II disorder: Secondary | ICD-10-CM | POA: Diagnosis not present

## 2017-11-03 DIAGNOSIS — M25551 Pain in right hip: Secondary | ICD-10-CM | POA: Diagnosis not present

## 2017-11-03 MED ORDER — BUPIVACAINE HCL (PF) 0.5 % IJ SOLN
INTRAMUSCULAR | Status: AC
Start: 2017-11-03 — End: 2017-11-03
  Filled 2017-11-03: qty 10

## 2017-11-03 MED ORDER — TRIAMCINOLONE ACETONIDE 40 MG/ML IJ SUSP
INTRAMUSCULAR | Status: AC
  Filled 2017-11-03: qty 1

## 2017-11-03 MED ORDER — HYDROCODONE-ACETAMINOPHEN 5-325 MG PO TABS: 1.0000 | ORAL_TABLET | ORAL | 0 refills | Status: DC | PRN

## 2017-11-03 NOTE — ED Provider Notes (Signed)
[facility]    CSN: [id number] Arrival date & time: 11/03/17  1706     History   Chief Complaint Chief Complaint  Patient presents with  . Hip Pain    HPI Erica Larson is a 41 y.o. female.   41 year old female complains of right lateral hip pain radiating to the right lateral thigh for about one week. She was seen in this urgent care a couple months ago and diagnosed with trochanteric bursitis and received steroid injection. She is requesting another injection. She also wants hydrocodone to help her sleep at night.      Past Medical History:  Diagnosis Date  . Acne   . Amenorrhea   . Anxiety   . Depression   . Infertility, female    treatment with oligammenorhea  Dr Chevis Pretty   . Migraine   . Restless legs syndrome   . Thyroiditis     Patient Active Problem List   Diagnosis Date Noted  . Restless leg syndrome 11/05/2015  . Bipolar 2 disorder, major depressive episode (HCC) 11/04/2015  . Anxiety state, unspecified 09/27/2013  . Fatigue 01/02/2011  . VITAMIN D DEFICIENCY 01/05/2009  . Hypercalcemia 01/05/2009  . NUMBNESS 01/05/2009  . THYROIDITIS 01/02/2009  . PALPITATIONS, RECURRENT 01/02/2009  . SINUSITIS- ACUTE-NOS 08/15/2008  . FEMALE INFERTILITY 07/18/2008  . WEIGHT GAIN 07/18/2008  . ACUTE TONSILLITIS 12/30/2007  . URI 12/30/2007  . MIGRAINE HEADACHE 08/12/2007  . AMENORRHEA 08/12/2007    Past Surgical History:  Procedure Laterality Date  . DILATION AND CURETTAGE OF UTERUS      OB History    Gravida Para Term Preterm AB Living   3 1   1 2 1    SAB TAB Ectopic Multiple Live Births       2           Home Medications    Prior to Admission medications   Medication Sig Start Date End Date Taking? Authorizing Provider  ARIPiprazole (ABILIFY) 5 MG tablet Take 1 tablet (5 mg total) by mouth daily. 10/29/17 10/29/18  Benjaman Pott, MD  frovatriptan (FROVA) 2.5 MG tablet Take one pill daily for 5 days 09/23/17   Anson Fret, MD    gabapentin (NEURONTIN) 300 MG capsule Take 1 capsule (300 mg total) 3 (three) times daily by mouth. 09/23/17   Anson Fret, MD  HYDROcodone-acetaminophen (NORCO/VICODIN) 5-325 MG tablet Take 1 tablet by mouth every 4 (four) hours as needed. 11/03/17   Hayden Rasmussen, NP  lamoTRIgine (LAMICTAL) 25 MG tablet Take 1 tablet (25 mg total) by mouth daily. 10/22/17 10/22/18  Benjaman Pott, MD  LAMOTRIGINE PO Take 50 mg by mouth daily.    [provider]  levothyroxine (SYNTHROID, LEVOTHROID) 25 MCG tablet Take 50 mcg by mouth.  09/10/13   [provider]  methylPREDNISolone (MEDROL DOSEPAK) 4 MG TBPK tablet Take pills daily all together in the morning with food for 6 days 09/23/17   Anson Fret, MD  naratriptan (AMERGE) 2.5 MG tablet Take one tablet twice daily for 5 days. 09/24/17   Anson Fret, MD  rOPINIRole (REQUIP) 1 MG tablet Take 1 mg by mouth at bedtime.    [provider]    Family History Family History  Problem Relation Age of Onset  . Thyroid disease Mother   . Nephrolithiasis Mother   . Urolithiasis Mother   . Heart attack Mother        signs but neg eval  .  Nephrolithiasis Father   . Urolithiasis Father   . Anxiety disorder Father   . Drug abuse Father   . Thyroid disease Sister   . Heart disease Maternal Grandmother     Social History Social History   Tobacco Use  . Smoking status: Never Smoker  . Smokeless tobacco: Never Used  Substance Use Topics  . Alcohol use: No  . Drug use: No     Allergies   Compazine [prochlorperazine]   Review of Systems Review of Systems  Constitutional: Negative.  Negative for activity change, chills and fever.  HENT: Negative.   Respiratory: Negative.   Cardiovascular: Negative.   Musculoskeletal:       As per HPI  Skin: Negative for color change, pallor and rash.  Neurological: Negative.   All other systems reviewed and are negative.    Physical Exam Triage Vital Signs ED Triage  Vitals [11/03/17 1731]  Enc Vitals Group     BP 105/73     Pulse Rate 86     Resp 18     Temp 98.2 F (36.8 C)     Temp Source Oral     SpO2 99 %     Weight      Height      Head Circumference      Peak Flow      Pain Score 7     Pain Loc      Pain Edu?      Excl. in GC?    No data found.  Updated Vital Signs BP 105/73 (BP Location: Left Arm)   Pulse 86   Temp 98.2 F (36.8 C) (Oral)   Resp 18   SpO2 99%   Visual Acuity Right Eye Distance:   Left Eye Distance:   Bilateral Distance:    Right Eye Near:   Left Eye Near:    Bilateral Near:     Physical Exam  Constitutional: She is oriented to person, place, and time. She appears well-developed and well-nourished. No distress.  HENT:  Head: Normocephalic and atraumatic.  Eyes: EOM are normal.  Neck: Neck supple.  Pulmonary/Chest: Effort normal.  Musculoskeletal: She exhibits tenderness. She exhibits no edema.  Tenderness to the right lateral hip as well as the musculature of the right lateral thigh. Greatest amount of point tenderness to the right lateral hip.  Neurological: She is alert and oriented to person, place, and time. No cranial nerve deficit.  Skin: Skin is warm and dry.  Nursing note and vitals reviewed.    UC Treatments / Results  Labs (all labs ordered are listed, but only abnormal results are displayed) Labs Reviewed - No data to display  EKG  EKG Interpretation None       Radiology No results found.  Procedures Procedures (including critical care time) Injection to the right lateral hip bursa with 2 cc of bupivacaine and 40 mg of Kenalog. Per provider Medications Ordered in UC Medications - No data to display   Initial Impression / Assessment and Plan / UC Course  I have reviewed the triage vital signs and the nursing notes.  Pertinent labs & imaging results that were available during my care of the patient were reviewed by me and considered in my medical decision making (see  chart for details).       Final Clinical Impressions(s) / UC Diagnoses   Final diagnoses:  Right hip pain  Trochanteric bursitis of right hip  possible drug seeking  ED Discharge Orders  Ordered    HYDROcodone-acetaminophen (NORCO/VICODIN) 5-325 MG tablet  Every 4 hours PRN     11/03/17 1859       Controlled Substance Prescriptions [place] Controlled Substance Registry consulted? Yes, I have consulted the [place] Controlled Substances Registry for this patient, and feel the risk/benefit ratio today is favorable for proceeding with this prescription for a controlled substance.   Hayden Rasmussen, NP 11/03/17 1900

## 2017-11-03 NOTE — ED Triage Notes (Signed)
Pt here for right hip pain with hx of same with bursitis

## 2017-11-03 NOTE — Progress Notes (Signed)
    Daily Group Progress Note  Program: IOP  Group Time: 9:00-12:00  Participation Level: Active  Behavioral Response: Appropriate  Type of Therapy:  Group Therapy  Summary of Progress: Pt. Continues to present as calm, talkative. Pt. Discussed with the group that she had a good weekend. Pt. 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 Pt. Discussed that she continues to be challenged to set healthy boundaries with her estranged husband and that her 22 year old daughter is very controlling and she wants to please her. Pt. Was receptive to feedback from the therapist and the group about learning to please herself and resisting pressure from her husband to answer phone calls and texts. Pt. Participated in medication management education group with the pharmacist. Pt. Stated that the group was informative, but she was not having problems with her medication.      Shaune Pollack, LPC

## 2017-11-04 ENCOUNTER — Other Ambulatory Visit (HOSPITAL_COMMUNITY): Payer: BLUE CROSS/BLUE SHIELD

## 2017-11-04 ENCOUNTER — Other Ambulatory Visit: Payer: BLUE CROSS/BLUE SHIELD

## 2017-11-04 NOTE — Telephone Encounter (Signed)
error 

## 2017-11-05 ENCOUNTER — Other Ambulatory Visit: Payer: BLUE CROSS/BLUE SHIELD

## 2017-11-05 ENCOUNTER — Telehealth: Payer: Self-pay | Admitting: Neurology

## 2017-11-05 ENCOUNTER — Other Ambulatory Visit (HOSPITAL_COMMUNITY): Payer: BLUE CROSS/BLUE SHIELD | Admitting: Psychiatry

## 2017-11-05 DIAGNOSIS — F3181 Bipolar II disorder: Secondary | ICD-10-CM | POA: Diagnosis not present

## 2017-11-05 NOTE — Progress Notes (Signed)
    Daily Group Progress Note  Program: IOP  Group Time: 9:00-12:00  Participation Level: Active  Behavioral Response: Appropriate  Type of Therapy:  Group Therapy  Summary of Progress: Pt. Presented as calm, mildly distracted, appropriately tearful. Pt. Discussed that she received news since her last session of abnormal cervical cells. Pt. Was supported by the group and encouraged to follow-through with her doctor's recommendations and to try not to over-think until she receives further confirmation from her medical providers. Pt. Indicated that she reached our to her daughter and her estranged husband for support and that they were appropriate with her. Pt. 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 Pt. Participated in breath-focused meditation for stress reduction and grounding. Pt. Reported that the exercise was helpful for her to slow her breathing and to focus her thoughts. Pt. Provided supportive feedback to discharging Pt.    Shaune PollackBrown, Jennifer B, LPC

## 2017-11-05 NOTE — Telephone Encounter (Signed)
Due to our [facility] mobile unit machine being down. The patient is scheduled at [facility] for Thursday 11/13/17 arrival time is 11:45 am. Patient is aware of time and day.

## 2017-11-06 ENCOUNTER — Other Ambulatory Visit (HOSPITAL_COMMUNITY): Payer: BLUE CROSS/BLUE SHIELD | Admitting: Psychiatry

## 2017-11-06 DIAGNOSIS — F3181 Bipolar II disorder: Secondary | ICD-10-CM

## 2017-11-06 NOTE — Progress Notes (Signed)
    Daily Group Progress Note  Program: IOP  Group Time: 9:00-12:00  Participation Level: Active  Behavioral Response: Appropriate  Type of Therapy:  Group Therapy  Summary of Progress: Pt. Participated in the group process, some guilt and shame related to communicating with her estranged husband. Pt. Reports that she continues to communicate with him out of habit, but she feels that she is gaining awareness of her toxic patterns and better understands that she needs to have more rigid contact boundaries. Pt. Was supported by the group. Pt. Participated in yoga therapy with Forde Radon, LPC.      Shaune Pollack, LPC

## 2017-11-07 ENCOUNTER — Other Ambulatory Visit (HOSPITAL_COMMUNITY): Payer: BLUE CROSS/BLUE SHIELD | Admitting: Psychiatry

## 2017-11-07 DIAGNOSIS — F3181 Bipolar II disorder: Secondary | ICD-10-CM

## 2017-11-10 ENCOUNTER — Other Ambulatory Visit (HOSPITAL_COMMUNITY): Payer: BLUE CROSS/BLUE SHIELD | Admitting: Psychiatry

## 2017-11-10 DIAGNOSIS — F3181 Bipolar II disorder: Secondary | ICD-10-CM | POA: Diagnosis not present
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
## 2017-11-10 NOTE — Progress Notes (Signed)
    Daily Group Progress Note  Program: IOP  Group Time: 9:00-12:00  Participation Level: Active  Behavioral Response: Appropriate  Type of Therapy:  Group Therapy  Summary of Progress: Pt. Presented with bright affect, talkative, engaged in the group process. Pt. Met with the psychiatrist. Pt. Met with the pharmacist for medication management group. Pt. Participated in group therapy and discussed her sadness and disappointment regarding her relationship with her daughter and things that her husband said to her over the weekend that were mean. Pt. Took homework assignment from the therapist to write down her list of "non-negotiable" behaviors in a relationship which will include things that a person will not be able to say or do to her if they want to maintain a relationship with her. Pt. Discussed her plans for the Christmas break from IOP.      Nancie Neas, LPC

## 2017-11-12 ENCOUNTER — Other Ambulatory Visit (HOSPITAL_COMMUNITY): Payer: BLUE CROSS/BLUE SHIELD

## 2017-11-13 ENCOUNTER — Other Ambulatory Visit (HOSPITAL_COMMUNITY): Payer: BLUE CROSS/BLUE SHIELD

## 2017-11-14 ENCOUNTER — Other Ambulatory Visit (HOSPITAL_COMMUNITY): Payer: BLUE CROSS/BLUE SHIELD | Admitting: Psychiatry

## 2017-11-14 DIAGNOSIS — F3181 Bipolar II disorder: Secondary | ICD-10-CM
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
## 2017-11-17 ENCOUNTER — Other Ambulatory Visit (HOSPITAL_COMMUNITY): Payer: BLUE CROSS/BLUE SHIELD | Admitting: Licensed Clinical Social Worker

## 2017-11-17 DIAGNOSIS — F3181 Bipolar II disorder: Secondary | ICD-10-CM

## 2017-11-19 ENCOUNTER — Telehealth: Payer: Self-pay | Admitting: Neurology

## 2017-11-19 ENCOUNTER — Other Ambulatory Visit (HOSPITAL_COMMUNITY): Payer: BLUE CROSS/BLUE SHIELD

## 2017-11-19 NOTE — Telephone Encounter (Signed)
Reviewed report from [facility], MRI of the brain is normal. However I also ordered an MRI cervical spine. Would you see if emily or debra could get that report as well before we call her with results? thanks

## 2017-11-20 ENCOUNTER — Other Ambulatory Visit (HOSPITAL_COMMUNITY): Payer: BLUE CROSS/BLUE SHIELD

## 2017-11-20 NOTE — Telephone Encounter (Signed)
Report received and ready for MD review.

## 2017-11-20 NOTE — Progress Notes (Signed)
    Daily Group Progress Note  Program: IOP  Group Time: 9 - 12  Participation Level: Active  Behavioral Response: Appropriate  Type of Therapy:  Group Therapy  Summary of Progress: Pt participated in medication group with pharmacist. Pt engaged in check in and group discussion on vulnerability. Pt shares her weekend was rough as she continues to get mixed messages from her husband and is desirous to be a family unit again. Pt shares she did spend time with friends which was helpful to distract her. Pt shares she struggles with vulnerability due to a history of feeling rejected. Pt denies SI/HI.    Donia Guiles, LCSW

## 2017-11-20 NOTE — Telephone Encounter (Signed)
I contact Shannon at [facility] to fax me the results.

## 2017-11-21 ENCOUNTER — Other Ambulatory Visit (HOSPITAL_COMMUNITY): Payer: BLUE CROSS/BLUE SHIELD | Attending: Psychiatry | Admitting: Psychiatry

## 2017-11-21 DIAGNOSIS — F3181 Bipolar II disorder: Secondary | ICD-10-CM

## 2017-11-21 DIAGNOSIS — F329 Major depressive disorder, single episode, unspecified: Secondary | ICD-10-CM | POA: Insufficient documentation
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
## 2017-11-24 ENCOUNTER — Other Ambulatory Visit (HOSPITAL_COMMUNITY): Payer: BLUE CROSS/BLUE SHIELD

## 2017-11-24 NOTE — Telephone Encounter (Signed)
Called and spoke with patient. She is aware MRI brain & c-spine unremarkable by report and that we are in the process of obtaining the CD for images. Questions answered. Pt verbalized appreciation.

## 2017-11-24 NOTE — Telephone Encounter (Signed)
MRi of the brain and cervical spine appear unremarkable per report. Please let patient know. I need the CD with images though, can we get that?

## 2017-11-25 ENCOUNTER — Other Ambulatory Visit (HOSPITAL_COMMUNITY): Payer: BLUE CROSS/BLUE SHIELD | Admitting: Psychiatry

## 2017-11-25 DIAGNOSIS — F329 Major depressive disorder, single episode, unspecified: Secondary | ICD-10-CM | POA: Diagnosis not present

## 2017-11-25 DIAGNOSIS — F3181 Bipolar II disorder: Secondary | ICD-10-CM

## 2017-11-25 NOTE — Progress Notes (Signed)
    Daily Group Progress Note  Program: IOP  Group Time: 9:00-12:00  Participation Level: Active  Behavioral Response: Appropriate  Type of Therapy:  Group Therapy  Summary of Progress: Pt. Presented as talkative, engaged in the group process, appropriately tearful. Pt. Continues to struggle with healthy boundaries with estranged husband. Pt. Identified that she wants to feel "complete" in 2019 because often she feels broken after her interaction with her husband. Pt. Discussed wanting to be with a partner that provides her with understanding, trust, patience, love, and empathy. Counselor challenged Pt. To create a plan not have have contact with her ex-husband for 30 days so that she can become stronger and clear about what she and wants and needs from the relationship. Pt. Recognized that this feedback is consistent with what her individual therapist has recommended for her and believes that this is what she needs to do, but continues to struggle with whether she is strong enough to follow through with it distance from him.     Shaune Pollack, LPC

## 2017-11-26 ENCOUNTER — Other Ambulatory Visit (HOSPITAL_COMMUNITY): Payer: BLUE CROSS/BLUE SHIELD | Admitting: Psychiatry

## 2017-11-26 DIAGNOSIS — F3181 Bipolar II disorder: Secondary | ICD-10-CM

## 2017-11-26 DIAGNOSIS — F329 Major depressive disorder, single episode, unspecified: Secondary | ICD-10-CM | POA: Diagnosis not present

## 2017-11-26 NOTE — Progress Notes (Signed)
Patient ID: Erica Larson, female   DOB: 24-Feb-1976, 42 y.o.   MRN: [id number]  Progress note.  We talked about racing thoughts which are consistent with anxiety.  Plan is to take the gabapentin 300 mg tid for anxiety.  She takes it for headaches currently as needed.

## 2017-11-26 NOTE — Progress Notes (Signed)
    Daily Group Progress Note  Program: IOP  Group Time: 9:00-12:00  Participation Level: Active  Behavioral Response: Appropriate  Type of Therapy:  Group Therapy  Summary of Progress: Pt. Presented as quiet, engaged in the group process, mildly anxious. Pt. Shared with the group that she received good news regarding her MRI results. Pt. Shared that she continues to struggle with setting boundaries with her estranged husband, but she feels that she is accepting that she needs to do this for herself and knows that it is going to be very difficult for herself and is reaching out to old friends for support. Pt. Participated in discussion facilitated by the [facility].     Shaune Pollack, LPC

## 2017-11-27 ENCOUNTER — Other Ambulatory Visit (HOSPITAL_COMMUNITY): Payer: BLUE CROSS/BLUE SHIELD | Admitting: Psychiatry

## 2017-11-27 DIAGNOSIS — F3181 Bipolar II disorder: Secondary | ICD-10-CM

## 2017-11-27 DIAGNOSIS — F329 Major depressive disorder, single episode, unspecified: Secondary | ICD-10-CM | POA: Diagnosis not present
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
## 2017-11-28 ENCOUNTER — Other Ambulatory Visit (HOSPITAL_COMMUNITY): Payer: BLUE CROSS/BLUE SHIELD

## 2017-12-01 ENCOUNTER — Other Ambulatory Visit (HOSPITAL_COMMUNITY): Payer: BLUE CROSS/BLUE SHIELD

## 2017-12-02 ENCOUNTER — Other Ambulatory Visit (HOSPITAL_COMMUNITY): Payer: BLUE CROSS/BLUE SHIELD | Admitting: Psychiatry

## 2017-12-02 DIAGNOSIS — F329 Major depressive disorder, single episode, unspecified: Secondary | ICD-10-CM | POA: Diagnosis not present

## 2017-12-02 DIAGNOSIS — F3181 Bipolar II disorder: Secondary | ICD-10-CM

## 2017-12-03 ENCOUNTER — Other Ambulatory Visit (HOSPITAL_COMMUNITY): Payer: BLUE CROSS/BLUE SHIELD | Admitting: Psychiatry

## 2017-12-03 DIAGNOSIS — F3181 Bipolar II disorder: Secondary | ICD-10-CM

## 2017-12-03 DIAGNOSIS — F329 Major depressive disorder, single episode, unspecified: Secondary | ICD-10-CM | POA: Diagnosis not present

## 2017-12-03 NOTE — Patient Instructions (Signed)
D:  Pt completed MH-IOP today.  A:  Follow up with Dr. Lolly Mustache on 01-29-18 @ 8 a.m and therapist in [place].  Encouraged support groups.  R:  Pt receptive.

## 2017-12-03 NOTE — Progress Notes (Signed)
Patient ID: Erica Larson, female   DOB: Nov 14, 1976, 42 y.o.   MRN: [id number]  [facility] IOP DISCHARGE NOTE  Patient:  Erica Larson DOB:  Sep 25, 1976  Date of Admission:10/22/2017  Date of Discharge: 12/03/2017  Reason for Admission:depression  IOP Course:attended and participated.  Feels less depressed and more accepting that she has to make changes in how she relates to others particularly to her husband.  More optimistic that she can handle things better after this stay.  Mental Status at Discharge:no suicidal thinking  Diagnosis:   Level of Care:  IOP  Discharge destination:    Follow-up recommendations:  Has appointment with her psychiatrist  Comments:  Continue aripiprazole 5 mg daily, gabapentin 300 mg tid, lamotrigine 75 mg daily .  Diagnosis remains bipolar 2 disorder depressed  The patient received suicide prevention pamphlet:  Yes   Carolanne Grumbling, MD
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
## 2017-12-04 ENCOUNTER — Other Ambulatory Visit (HOSPITAL_COMMUNITY): Payer: BLUE CROSS/BLUE SHIELD

## 2017-12-05 ENCOUNTER — Other Ambulatory Visit (HOSPITAL_COMMUNITY): Payer: BLUE CROSS/BLUE SHIELD

## 2017-12-08 ENCOUNTER — Other Ambulatory Visit (HOSPITAL_COMMUNITY): Payer: BLUE CROSS/BLUE SHIELD

## 2017-12-09 ENCOUNTER — Other Ambulatory Visit (HOSPITAL_COMMUNITY): Payer: BLUE CROSS/BLUE SHIELD

## 2017-12-10 ENCOUNTER — Other Ambulatory Visit (HOSPITAL_COMMUNITY): Payer: BLUE CROSS/BLUE SHIELD
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
## 2017-12-11 ENCOUNTER — Other Ambulatory Visit (HOSPITAL_COMMUNITY): Payer: BLUE CROSS/BLUE SHIELD

## 2017-12-12 ENCOUNTER — Other Ambulatory Visit (HOSPITAL_COMMUNITY): Payer: BLUE CROSS/BLUE SHIELD

## 2017-12-15 ENCOUNTER — Other Ambulatory Visit (HOSPITAL_COMMUNITY): Payer: BLUE CROSS/BLUE SHIELD

## 2017-12-15 NOTE — Progress Notes (Signed)
    Daily Group Progress Note  Program: IOP  Group Time: 9:00-12:00  Participation Level: Active  Behavioral Response: Appropriate  Type of Therapy:  Group therapy  Summary of Progress: Pt. was talkative and actively engaged in the group process. Pt. expressed to group that she felt depressed and drained.  However she  participated and share some thoughts about her separation with her husband and how difficult it is for her to avoid communicating with him. She was active and participated in group throughout the session. Pt. participated in grief and los session with the Chaplain. The Chaplain explained the various ways people grieve and how difficult the grieving process can be, but can get better over time. Shaune Pollack, LPC

## 2017-12-16 ENCOUNTER — Other Ambulatory Visit (HOSPITAL_COMMUNITY): Payer: BLUE CROSS/BLUE SHIELD

## 2017-12-22 ENCOUNTER — Encounter (HOSPITAL_COMMUNITY): Payer: Self-pay

## 2017-12-22 NOTE — Patient Instructions (Addendum)
Your procedure is scheduled on:  Monday, Feb 11  Enter through the Main Entrance of [facility] at: 6 am  Pick up the phone at the desk and dial [phone].  Call this number if you have problems the morning of surgery: [phone].  Remember: Do NOT eat or Do NOT drink clear liquids (including water) after midnight Sunday  Take these medicines the morning of surgery with a SIP OF WATER:  Synthroid and lamicatal  Stop herbal medications and supplements at this time.  Do NOT wear jewelry (body piercing), metal hair clips/bobby pins, make-up, or nail polish. Do NOT wear lotions, powders, or perfumes.  You may wear deoderant. Do NOT shave for 48 hours prior to surgery. Do NOT bring valuables to the hospital. Contacts may not be worn into surgery.  Leave suitcase in car.  After surgery it may be brought to your room.  For patients admitted to the hospital, checkout time is 11:00 AM the day of discharge. Have a responsible adult drive you home and stay with you for 24 hours after your procedure.  Home with Husband Nida Boatman cell [phone]

## 2017-12-23 NOTE — H&P (Addendum)
Erica Larson is an 42 y.o. female. w/h/o PCOS, and longstanding amenorrhea with likely unopposed estrogen. She had new onset of AUB and had an EMB that was WNL. Also had an abnl pap smear with CIN diagnosed. She desires a hysterectomy.   Pertinent Gynecological History: Menses: amenorrhea previously now with AUB daily Bleeding: dysfunctional uterine bleeding Contraception: none DES exposure: denies Blood transfusions: none Sexually transmitted diseases: no past history Previous GYN Procedures: colpo  Last mammogram: normal Date: 10/2017 Last pap: abnormal: NIL hpv +, colpo CIN1 Date: 2018 OB History: G1, P1001   Menstrual History: No LMP recorded. Patient is not currently having periods (Reason: Other).    Past Medical History:  Diagnosis Date  . Acne   . Amenorrhea   . Anxiety   . Bipolar disorder (HCC)    type 2  . Depression   . Infertility, female    treatment with oligammenorhea  Dr Chevis Pretty   . Migraine   . Restless legs syndrome   . Thyroiditis     Past Surgical History:  Procedure Laterality Date  . DILATION AND CURETTAGE OF UTERUS      Family History  Problem Relation Age of Onset  . Thyroid disease Mother   . Nephrolithiasis Mother   . Urolithiasis Mother   . Heart attack Mother        signs but neg eval  . Nephrolithiasis Father   . Urolithiasis Father   . Anxiety disorder Father   . Drug abuse Father   . Thyroid disease Sister   . Heart disease Maternal Grandmother     Social History:  reports that  has never smoked. she has never used smokeless tobacco. She reports that she does not drink alcohol or use drugs.  Allergies:  Allergies  Allergen Reactions  . Compazine [Prochlorperazine] Other (See Comments)    Legs jumping   . Oxycodone Itching    No medications prior to admission.  Levothyroxine lamictal 175mg   ROS  There were no vitals taken for this visit. Physical Exam  Gen: well appearing, NAD CV: Reg rate Pulm: NWOB Abd:  soft, nondistended, nontender, no masses GYN: uterus 7 week size, retroverted, no adnexa ttp/CMT Ext: No edema b/l   No results found for this or any previous visit (from the past 24 hour(s)).  No results found.  TVUS: 7cm retroverted uterus, ovaries w/PCOS b/l   Assessment/Plan: 41yo G1P1 with a h/o longstanding PCOS presents requesting definitive surgery. She had amenorrea with unopposed estrogen x many years with new onset AUB. EMB was WNL but patient persisted to have AUB and declined hormonal management or ablation, requested hysterectomy. Plan for LAVH, BS, modified mccalls, and cystoscopy, any additional procedures. Risks discussed including infection, bleeding, damage to surrounding structures, need for additional procedures, finding cancer postoperatively in pathology, and postop DVT/tranfusion. All questions answered. Consent signed. Proceed with above surgery. 2g ancef on call to OR. She has a h/o hypothyroidism and migraines - will continue both of these medications postop.    Belva Agee MD   Madelaine Etienne Leger 12/23/2017, 2:44 PM   No changes to above H&P. Risks again discussed and consent signed. Rosie Fate MD

## 2017-12-24 ENCOUNTER — Other Ambulatory Visit: Payer: Self-pay

## 2017-12-24 ENCOUNTER — Encounter (HOSPITAL_COMMUNITY): Payer: Self-pay

## 2017-12-24 ENCOUNTER — Encounter (HOSPITAL_COMMUNITY)
Admission: RE | Admit: 2017-12-24 | Discharge: 2017-12-24 | Disposition: A | Discharge status: Home / Self Care | Payer: BLUE CROSS/BLUE SHIELD | Source: Ambulatory Visit | Attending: Obstetrics and Gynecology | Admitting: Obstetrics and Gynecology

## 2017-12-24 DIAGNOSIS — Z01812 Encounter for preprocedural laboratory examination: Secondary | ICD-10-CM | POA: Diagnosis not present

## 2017-12-24 HISTORY — DX: Bipolar disorder, unspecified: F31.9

## 2017-12-24 HISTORY — DX: Hypothyroidism, unspecified: E03.9

## 2017-12-24 LAB — TYPE AND SCREEN
ABO/RH(D): O POS
Antibody Screen: NEGATIVE

## 2017-12-24 LAB — CBC
HCT: 38.1 % (ref 36.0–46.0)
Hemoglobin: 12.7 g/dL (ref 12.0–15.0)
MCH: 29.7 pg (ref 26.0–34.0)
MCHC: 33.3 g/dL (ref 30.0–36.0)
MCV: 89.2 fL (ref 78.0–100.0)
Platelets: 370 10*3/uL (ref 150–400)
RBC: 4.27 MIL/uL (ref 3.87–5.11)
RDW: 13.6 % (ref 11.5–15.5)
WBC: 7.1 10*3/uL (ref 4.0–10.5)

## 2017-12-24 LAB — ABO/RH: ABO/RH(D): O POS

## 2017-12-29 ENCOUNTER — Ambulatory Visit (HOSPITAL_COMMUNITY): Payer: BLUE CROSS/BLUE SHIELD | Admitting: Anesthesiology

## 2017-12-29 ENCOUNTER — Encounter (HOSPITAL_COMMUNITY)
Admission: AD | Disposition: A | Discharge status: Home / Self Care | Payer: Self-pay | Source: Ambulatory Visit | Attending: Obstetrics and Gynecology

## 2017-12-29 ENCOUNTER — Observation Stay (HOSPITAL_COMMUNITY)
Admission: AD | Admit: 2017-12-29 | Discharge: 2017-12-31 | Disposition: A | Discharge status: Home / Self Care | Payer: BLUE CROSS/BLUE SHIELD | Source: Ambulatory Visit | Attending: Obstetrics and Gynecology | Admitting: Obstetrics and Gynecology

## 2017-12-29 ENCOUNTER — Encounter (HOSPITAL_COMMUNITY): Payer: Self-pay

## 2017-12-29 ENCOUNTER — Other Ambulatory Visit: Payer: Self-pay

## 2017-12-29 DIAGNOSIS — N912 Amenorrhea, unspecified: Secondary | ICD-10-CM | POA: Diagnosis present

## 2017-12-29 DIAGNOSIS — F319 Bipolar disorder, unspecified: Secondary | ICD-10-CM | POA: Insufficient documentation

## 2017-12-29 DIAGNOSIS — E039 Hypothyroidism, unspecified: Secondary | ICD-10-CM | POA: Insufficient documentation

## 2017-12-29 DIAGNOSIS — N870 Mild cervical dysplasia: Secondary | ICD-10-CM | POA: Insufficient documentation

## 2017-12-29 DIAGNOSIS — Z79890 Hormone replacement therapy (postmenopausal): Secondary | ICD-10-CM | POA: Diagnosis not present

## 2017-12-29 DIAGNOSIS — N840 Polyp of corpus uteri: Secondary | ICD-10-CM | POA: Diagnosis not present

## 2017-12-29 DIAGNOSIS — Z90710 Acquired absence of both cervix and uterus: Secondary | ICD-10-CM | POA: Diagnosis present

## 2017-12-29 DIAGNOSIS — Z79899 Other long term (current) drug therapy: Secondary | ICD-10-CM | POA: Diagnosis not present

## 2017-12-29 DIAGNOSIS — N888 Other specified noninflammatory disorders of cervix uteri: Secondary | ICD-10-CM | POA: Diagnosis not present

## 2017-12-29 DIAGNOSIS — E282 Polycystic ovarian syndrome: Principal | ICD-10-CM | POA: Insufficient documentation

## 2017-12-29 DIAGNOSIS — N938 Other specified abnormal uterine and vaginal bleeding: Secondary | ICD-10-CM | POA: Insufficient documentation

## 2017-12-29 HISTORY — PX: CYSTOSCOPY: SHX5120

## 2017-12-29 HISTORY — PX: LAPAROSCOPIC VAGINAL HYSTERECTOMY WITH SALPINGECTOMY: SHX6680

## 2017-12-29 LAB — PREGNANCY, URINE: Preg Test, Ur: NEGATIVE

## 2017-12-29 SURGERY — HYSTERECTOMY, VAGINAL, LAPAROSCOPY-ASSISTED, WITH SALPINGECTOMY
Anesthesia: General | Laterality: Bilateral

## 2017-12-29 MED ORDER — KETOROLAC TROMETHAMINE 30 MG/ML IJ SOLN
30.0000 mg | Freq: Four times a day (QID) | INTRAMUSCULAR | Status: DC
  Filled 2017-12-29 (×2): qty 1

## 2017-12-29 MED ORDER — CEFAZOLIN SODIUM-DEXTROSE 2-4 GM/100ML-% IV SOLN
2.0000 g | INTRAVENOUS | Status: AC
  Administered 2017-12-29: 2 g via INTRAVENOUS

## 2017-12-29 MED ORDER — LAMOTRIGINE 150 MG PO TABS
150.0000 mg | ORAL_TABLET | Freq: Every day | ORAL | Status: DC
  Filled 2017-12-29: qty 1

## 2017-12-29 MED ORDER — MEPERIDINE HCL 25 MG/ML IJ SOLN
INTRAMUSCULAR | Status: AC
  Filled 2017-12-29: qty 1

## 2017-12-29 MED ORDER — BUPIVACAINE HCL (PF) 0.25 % IJ SOLN
INTRAMUSCULAR | Status: AC
  Filled 2017-12-29: qty 30

## 2017-12-29 MED ORDER — KETOROLAC TROMETHAMINE 30 MG/ML IJ SOLN
30.0000 mg | Freq: Four times a day (QID) | INTRAMUSCULAR | Status: DC
  Administered 2017-12-29 – 2017-12-30 (×5): 30 mg via INTRAVENOUS
  Filled 2017-12-29 (×5): qty 1

## 2017-12-29 MED ORDER — LIDOCAINE HCL (CARDIAC) 20 MG/ML IV SOLN
INTRAVENOUS | Status: DC | PRN
  Administered 2017-12-29: 30 mg via INTRAVENOUS

## 2017-12-29 MED ORDER — DEXTROSE-NACL 5-0.45 % IV SOLN
125.0000 mL/h | INTRAVENOUS | Status: AC
  Administered 2017-12-29: 125 mL/h via INTRAVENOUS

## 2017-12-29 MED ORDER — BUPIVACAINE-EPINEPHRINE (PF) 0.25% -1:200000 IJ SOLN
INTRAMUSCULAR | Status: AC
  Filled 2017-12-29: qty 30

## 2017-12-29 MED ORDER — PANTOPRAZOLE SODIUM 40 MG PO TBEC
40.0000 mg | DELAYED_RELEASE_TABLET | Freq: Every day | ORAL | Status: DC
  Administered 2017-12-30: 40 mg via ORAL
  Filled 2017-12-29 (×2): qty 1

## 2017-12-29 MED ORDER — PROPOFOL 10 MG/ML IV BOLUS
INTRAVENOUS | Status: AC
  Filled 2017-12-29: qty 20

## 2017-12-29 MED ORDER — SCOPOLAMINE 1 MG/3DAYS TD PT72: 1.0000 | MEDICATED_PATCH | Freq: Once | TRANSDERMAL | Status: DC

## 2017-12-29 MED ORDER — LEVOTHYROXINE SODIUM 50 MCG PO TABS
50.0000 ug | ORAL_TABLET | Freq: Every day | ORAL | Status: DC
  Administered 2017-12-30 – 2017-12-31 (×2): 50 ug via ORAL
  Filled 2017-12-29 (×3): qty 1

## 2017-12-29 MED ORDER — SUGAMMADEX SODIUM 200 MG/2ML IV SOLN
INTRAVENOUS | Status: AC
  Filled 2017-12-29: qty 2

## 2017-12-29 MED ORDER — MIDAZOLAM HCL 2 MG/2ML IJ SOLN
INTRAMUSCULAR | Status: DC | PRN
  Administered 2017-12-29: 2 mg via INTRAVENOUS

## 2017-12-29 MED ORDER — SUGAMMADEX SODIUM 200 MG/2ML IV SOLN
INTRAVENOUS | Status: DC | PRN
  Administered 2017-12-29: 200 mg via INTRAVENOUS

## 2017-12-29 MED ORDER — DEXAMETHASONE SODIUM PHOSPHATE 10 MG/ML IJ SOLN
INTRAMUSCULAR | Status: DC | PRN
  Administered 2017-12-29: 10 mg via INTRAVENOUS

## 2017-12-29 MED ORDER — MEPERIDINE HCL 25 MG/ML IJ SOLN
6.2500 mg | INTRAMUSCULAR | Status: DC | PRN
  Administered 2017-12-29: 12.5 mg via INTRAVENOUS

## 2017-12-29 MED ORDER — PROPOFOL 10 MG/ML IV BOLUS
INTRAVENOUS | Status: DC | PRN
  Administered 2017-12-29: 100 mg via INTRAVENOUS

## 2017-12-29 MED ORDER — SIMETHICONE 80 MG PO CHEW
80.0000 mg | CHEWABLE_TABLET | Freq: Four times a day (QID) | ORAL | Status: DC | PRN
  Administered 2017-12-30: 80 mg via ORAL
  Filled 2017-12-29: qty 1

## 2017-12-29 MED ORDER — BUPIVACAINE-EPINEPHRINE 0.25% -1:200000 IJ SOLN
INTRAMUSCULAR | Status: DC | PRN
  Administered 2017-12-29: 10 mL

## 2017-12-29 MED ORDER — FLUORESCEIN SODIUM 10 % IV SOLN
INTRAVENOUS | Status: AC
  Filled 2017-12-29: qty 5

## 2017-12-29 MED ORDER — SODIUM CHLORIDE 0.9 % IR SOLN
Status: DC | PRN
  Administered 2017-12-29: 1000 mL

## 2017-12-29 MED ORDER — DEXAMETHASONE SODIUM PHOSPHATE 10 MG/ML IJ SOLN
INTRAMUSCULAR | Status: AC
  Filled 2017-12-29: qty 1

## 2017-12-29 MED ORDER — ACETAMINOPHEN 500 MG PO TABS
1000.0000 mg | ORAL_TABLET | Freq: Four times a day (QID) | ORAL | Status: DC
  Administered 2017-12-29 – 2017-12-31 (×8): 1000 mg via ORAL
  Filled 2017-12-29 (×9): qty 2

## 2017-12-29 MED ORDER — SCOPOLAMINE 1 MG/3DAYS TD PT72
MEDICATED_PATCH | TRANSDERMAL | Status: AC
  Filled 2017-12-29: qty 1

## 2017-12-29 MED ORDER — FENTANYL CITRATE (PF) 250 MCG/5ML IJ SOLN
INTRAMUSCULAR | Status: AC
  Filled 2017-12-29: qty 5

## 2017-12-29 MED ORDER — LIDOCAINE HCL 1 % IJ SOLN
INTRAMUSCULAR | Status: AC
  Filled 2017-12-29: qty 20

## 2017-12-29 MED ORDER — FENTANYL CITRATE (PF) 100 MCG/2ML IJ SOLN
INTRAMUSCULAR | Status: DC | PRN
  Administered 2017-12-29: 50 ug via INTRAVENOUS
  Administered 2017-12-29 (×2): 100 ug via INTRAVENOUS

## 2017-12-29 MED ORDER — ONDANSETRON HCL 4 MG PO TABS: 4.0000 mg | ORAL_TABLET | Freq: Four times a day (QID) | ORAL | Status: DC | PRN

## 2017-12-29 MED ORDER — TRAMADOL HCL 50 MG PO TABS
50.0000 mg | ORAL_TABLET | Freq: Four times a day (QID) | ORAL | Status: DC | PRN
  Administered 2017-12-29 – 2017-12-31 (×5): 50 mg via ORAL
  Filled 2017-12-29 (×5): qty 1

## 2017-12-29 MED ORDER — FENTANYL CITRATE (PF) 100 MCG/2ML IJ SOLN
25.0000 ug | INTRAMUSCULAR | Status: DC | PRN
  Administered 2017-12-29 (×2): 25 ug via INTRAVENOUS

## 2017-12-29 MED ORDER — SODIUM CHLORIDE 0.9 % IJ SOLN
INTRAMUSCULAR | Status: DC | PRN
  Administered 2017-12-29: 10 mL

## 2017-12-29 MED ORDER — LAMOTRIGINE 100 MG PO TABS
200.0000 mg | ORAL_TABLET | Freq: Every day | ORAL | Status: DC
  Administered 2017-12-30 – 2017-12-31 (×2): 200 mg via ORAL
  Filled 2017-12-29 (×3): qty 2

## 2017-12-29 MED ORDER — FENTANYL CITRATE (PF) 100 MCG/2ML IJ SOLN
INTRAMUSCULAR | Status: AC
  Administered 2017-12-29: 25 ug via INTRAVENOUS
  Filled 2017-12-29: qty 2

## 2017-12-29 MED ORDER — METHYLENE BLUE 0.5 % INJ SOLN
INTRAVENOUS | Status: AC
  Filled 2017-12-29: qty 10

## 2017-12-29 MED ORDER — LIDOCAINE HCL (CARDIAC) 20 MG/ML IV SOLN
INTRAVENOUS | Status: AC
  Filled 2017-12-29: qty 5

## 2017-12-29 MED ORDER — ROCURONIUM BROMIDE 100 MG/10ML IV SOLN
INTRAVENOUS | Status: DC | PRN
  Administered 2017-12-29: 50 mg via INTRAVENOUS
  Administered 2017-12-29: 10 mg via INTRAVENOUS

## 2017-12-29 MED ORDER — ONDANSETRON HCL 4 MG/2ML IJ SOLN
4.0000 mg | Freq: Four times a day (QID) | INTRAMUSCULAR | Status: DC | PRN
Start: 2017-12-29 — End: 2017-12-31
  Administered 2017-12-29 – 2017-12-30 (×3): 4 mg via INTRAVENOUS
  Filled 2017-12-29 (×3): qty 2

## 2017-12-29 MED ORDER — LACTATED RINGERS IV SOLN: INTRAVENOUS | Status: DC

## 2017-12-29 MED ORDER — MORPHINE SULFATE (PF) 4 MG/ML IV SOLN
1.0000 mg | INTRAVENOUS | Status: DC | PRN
  Administered 2017-12-29: 1 mg via INTRAVENOUS
  Administered 2017-12-29: 2 mg via INTRAVENOUS
  Filled 2017-12-29 (×2): qty 1

## 2017-12-29 MED ORDER — SENNOSIDES-DOCUSATE SODIUM 8.6-50 MG PO TABS: 1.0000 | ORAL_TABLET | Freq: Every evening | ORAL | Status: DC | PRN

## 2017-12-29 MED ORDER — CEFAZOLIN SODIUM-DEXTROSE 2-4 GM/100ML-% IV SOLN
INTRAVENOUS | Status: AC
  Filled 2017-12-29: qty 100

## 2017-12-29 MED ORDER — ONDANSETRON HCL 4 MG/2ML IJ SOLN
INTRAMUSCULAR | Status: AC
  Filled 2017-12-29: qty 2

## 2017-12-29 MED ORDER — SODIUM CHLORIDE 0.9 % IJ SOLN
INTRAMUSCULAR | Status: AC
  Filled 2017-12-29: qty 10

## 2017-12-29 MED ORDER — ALUM & MAG HYDROXIDE-SIMETH 200-200-20 MG/5ML PO SUSP: 30.0000 mL | ORAL | Status: DC | PRN

## 2017-12-29 MED ORDER — ROCURONIUM BROMIDE 100 MG/10ML IV SOLN
INTRAVENOUS | Status: AC
  Filled 2017-12-29: qty 1

## 2017-12-29 MED ORDER — METOCLOPRAMIDE HCL 5 MG/ML IJ SOLN: 10.0000 mg | Freq: Once | INTRAMUSCULAR | Status: DC | PRN

## 2017-12-29 MED ORDER — ONDANSETRON HCL 4 MG/2ML IJ SOLN
INTRAMUSCULAR | Status: DC | PRN
  Administered 2017-12-29: 4 mg via INTRAVENOUS

## 2017-12-29 MED ORDER — STERILE WATER FOR IRRIGATION IR SOLN
Status: DC | PRN
  Administered 2017-12-29: 1000 mL via INTRAVESICAL

## 2017-12-29 MED ORDER — LACTATED RINGERS IV SOLN
INTRAVENOUS | Status: DC
  Administered 2017-12-29 (×3): via INTRAVENOUS

## 2017-12-29 MED ORDER — MIDAZOLAM HCL 2 MG/2ML IJ SOLN
INTRAMUSCULAR | Status: AC
  Filled 2017-12-29: qty 2

## 2017-12-29 MED ORDER — DOCUSATE SODIUM 100 MG PO CAPS
100.0000 mg | ORAL_CAPSULE | Freq: Two times a day (BID) | ORAL | Status: DC
  Administered 2017-12-29 – 2017-12-31 (×4): 100 mg via ORAL
  Filled 2017-12-29 (×4): qty 1

## 2017-12-29 SURGICAL SUPPLY — 35 items
CANISTER SUCT 3000ML PPV (MISCELLANEOUS) ×3 IMPLANT
COVER BACK TABLE 60X90IN (DRAPES) ×3 IMPLANT
COVER MAYO STAND STRL (DRAPES) ×3 IMPLANT
DECANTER SPIKE VIAL GLASS SM (MISCELLANEOUS) IMPLANT
DERMABOND ADVANCED (GAUZE/BANDAGES/DRESSINGS) ×1
DERMABOND ADVANCED .7 DNX12 (GAUZE/BANDAGES/DRESSINGS) ×2 IMPLANT
DRSG OPSITE 4X5.5 SM (GAUZE/BANDAGES/DRESSINGS) ×3 IMPLANT
DURAPREP 26ML APPLICATOR (WOUND CARE) ×3 IMPLANT
ELECT REM PT RETURN 9FT ADLT (ELECTROSURGICAL) ×3
ELECTRODE REM PT RTRN 9FT ADLT (ELECTROSURGICAL) ×2 IMPLANT
GLOVE BIO SURGEON STRL SZ 6.5 (GLOVE) ×12 IMPLANT
GLOVE BIOGEL PI IND STRL 7.0 (GLOVE) ×4 IMPLANT
GLOVE BIOGEL PI INDICATOR 7.0 (GLOVE) ×2
GLOVE ECLIPSE 6.5 STRL STRAW (GLOVE) ×6 IMPLANT
LIGASURE LAP L-HOOKWIRE 5 44CM (INSTRUMENTS) ×3 IMPLANT
NEEDLE INSUFFLATION 14GA 120MM (NEEDLE) ×3 IMPLANT
PACK LAVH (CUSTOM PROCEDURE TRAY) ×3 IMPLANT
PACK ROBOTIC GOWN (GOWN DISPOSABLE) ×9 IMPLANT
PAD OB MATERNITY 4.3X12.25 (PERSONAL CARE ITEMS) ×3 IMPLANT
PAD PREP 24X48 CUFFED NSTRL (MISCELLANEOUS) ×3 IMPLANT
SET CYSTO W/LG BORE CLAMP LF (SET/KITS/TRAYS/PACK) ×3 IMPLANT
SET IRRIG TUBING LAPAROSCOPIC (IRRIGATION / IRRIGATOR) IMPLANT
SUT VIC AB 0 CT1 18XCR BRD8 (SUTURE) ×4 IMPLANT
SUT VIC AB 0 CT1 36 (SUTURE) ×6 IMPLANT
SUT VIC AB 0 CT1 8-18 (SUTURE) ×2
SUT VIC AB 2-0 SH 27 (SUTURE) ×1
SUT VIC AB 2-0 SH 27XBRD (SUTURE) ×2 IMPLANT
SUT VIC AB 4-0 PS2 27 (SUTURE) ×3 IMPLANT
SUT VICRYL 0 TIES 12 18 (SUTURE) ×3 IMPLANT
SUT VICRYL 0 UR6 27IN ABS (SUTURE) ×3 IMPLANT
TOWEL OR 17X24 6PK STRL BLUE (TOWEL DISPOSABLE) ×9 IMPLANT
TRAY FOLEY CATH SILVER 14FR (SET/KITS/TRAYS/PACK) ×3 IMPLANT
TROCAR XCEL NON-BLD 11X100MML (ENDOMECHANICALS) ×3 IMPLANT
TROCAR XCEL NON-BLD 5MMX100MML (ENDOMECHANICALS) ×3 IMPLANT
WARMER LAPAROSCOPE (MISCELLANEOUS) ×3 IMPLANT

## 2017-12-29 NOTE — Anesthesia Postprocedure Evaluation (Signed)
Anesthesia Post Note  Patient: Erica Larson  Procedure(s) Performed: LAPAROSCOPIC ASSISTED VAGINAL HYSTERECTOMY WITH SALPINGECTOMY, mccalls culdoplasty (Bilateral ) CYSTOSCOPY     Patient location during evaluation: [facility] Anesthesia Type: General Level of consciousness: awake and alert and oriented Pain management: pain level controlled Vital Signs Assessment: post-procedure vital signs reviewed and stable Respiratory status: spontaneous breathing, nonlabored ventilation and respiratory function stable Cardiovascular status: stable Postop Assessment: adequate PO intake and no apparent nausea or vomiting Anesthetic complications: no    Last Vitals:  Vitals:   12/29/17 1224 12/29/17 1620  BP: 115/71 134/79  Pulse: 76 (!) 102  Resp: 18 18  Temp: 36.9 C (!) 36.4 C  SpO2: 99% 100%    Last Pain:  Vitals:   12/29/17 1600  TempSrc:   PainSc: 3    Pain Goal: Patients Stated Pain Goal: 4 (12/29/17 1120)               Cordarious Zeek

## 2017-12-29 NOTE — Anesthesia Postprocedure Evaluation (Signed)
Anesthesia Post Note  Patient: Erica Larson  Procedure(s) Performed: LAPAROSCOPIC ASSISTED VAGINAL HYSTERECTOMY WITH SALPINGECTOMY, mccalls culdoplasty (Bilateral ) CYSTOSCOPY     Patient location during evaluation: PACU Anesthesia Type: General Level of consciousness: awake and alert Pain management: pain level controlled Vital Signs Assessment: post-procedure vital signs reviewed and stable Respiratory status: spontaneous breathing, nonlabored ventilation, respiratory function stable and patient connected to nasal cannula oxygen Cardiovascular status: blood pressure returned to baseline and stable Postop Assessment: no apparent nausea or vomiting Anesthetic complications: no    Last Vitals:  Vitals:   12/29/17 1124 12/29/17 1224  BP: 130/83 115/71  Pulse: 81 76  Resp: 18 18  Temp: 36.9 C 36.9 C  SpO2: 100% 99%    Last Pain:  Vitals:   12/29/17 1406  TempSrc:   PainSc: 5    Pain Goal: Patients Stated Pain Goal: 4 (12/29/17 1120)               Phillips Grout

## 2017-12-29 NOTE — Anesthesia Procedure Notes (Signed)
Procedure Name: Intubation Date/Time: 12/29/2017 7:34 AM Performed by: Alvy Bimler, CRNA Pre-anesthesia Checklist: Patient identified, Emergency Drugs available, Suction available and Patient being monitored Patient Re-evaluated:Patient Re-evaluated prior to induction Oxygen Delivery Method: Circle system utilized Preoxygenation: Pre-oxygenation with 100% oxygen Induction Type: IV induction Ventilation: Mask ventilation without difficulty Laryngoscope Size: Mac and 3 Grade View: Grade II Tube type: Oral Tube size: 7.0 mm Number of attempts: 1 Airway Equipment and Method: Stylet Placement Confirmation: ETT inserted through vocal cords under direct vision,  positive ETCO2 and breath sounds checked- equal and bilateral Secured at: 21 (right lip) cm Tube secured with: Tape Dental Injury: Teeth and Oropharynx as per pre-operative assessment

## 2017-12-29 NOTE — Transfer of Care (Signed)
Immediate Anesthesia Transfer of Care Note  Patient: Erica Larson  Procedure(s) Performed: LAPAROSCOPIC ASSISTED VAGINAL HYSTERECTOMY WITH SALPINGECTOMY, mccalls culdoplasty (Bilateral ) CYSTOSCOPY  Patient Location: PACU  Anesthesia Type:General  Level of Consciousness: awake, alert , oriented and patient cooperative  Airway & Oxygen Therapy: Patient Spontanous Breathing and Patient connected to nasal cannula oxygen  Post-op Assessment: Report given to RN, Post -op Vital signs reviewed and stable and Patient moving all extremities X 4  Post vital signs: Reviewed and stable  Last Vitals:  Vitals:   12/29/17 0603  BP: 104/79  Pulse: 81  Resp: 16  Temp: 36.7 C  SpO2: 100%    Last Pain: There were no vitals filed for this visit.    Patients Stated Pain Goal: 4 (12/29/17 0603)  Complications: No apparent anesthesia complications

## 2017-12-29 NOTE — Anesthesia Preprocedure Evaluation (Signed)
Anesthesia Evaluation  Patient identified by MRN, date of birth, ID band Patient awake    Reviewed: Allergy & Precautions, NPO status , Patient's Chart, lab work & pertinent test results  Airway Mallampati: II  TM Distance: >3 FB Neck ROM: Full    Dental no notable dental hx.    Pulmonary neg pulmonary ROS,    Pulmonary exam normal breath sounds clear to auscultation       Cardiovascular negative cardio ROS Normal cardiovascular exam Rhythm:Regular Rate:Normal     Neuro/Psych negative neurological ROS  negative psych ROS   GI/Hepatic negative GI ROS, Neg liver ROS,   Endo/Other  Hypothyroidism   Renal/GU negative Renal ROS  negative genitourinary   Musculoskeletal negative musculoskeletal ROS (+)   Abdominal   Peds negative pediatric ROS (+)  Hematology negative hematology ROS (+)   Anesthesia Other Findings   Reproductive/Obstetrics negative OB ROS                             Anesthesia Physical Anesthesia Plan  ASA: II  Anesthesia Plan: General   Post-op Pain Management:    Induction: Intravenous  PONV Risk Score and Plan: 4 or greater and Ondansetron, Dexamethasone, Midazolam and Scopolamine patch - Pre-op  Airway Management Planned: Oral ETT  Additional Equipment:   Intra-op Plan:   Post-operative Plan: Extubation in OR  Informed Consent: I have reviewed the patients History and Physical, chart, labs and discussed the procedure including the risks, benefits and alternatives for the proposed anesthesia with the patient or authorized representative who has indicated his/her understanding and acceptance.   Dental advisory given  Plan Discussed with: CRNA  Anesthesia Plan Comments:         Anesthesia Quick Evaluation  

## 2017-12-29 NOTE — Addendum Note (Signed)
Addendum  created 12/29/17 1822 by Shanon Payor, CRNA   Sign clinical note

## 2017-12-29 NOTE — Brief Op Note (Signed)
12/29/2017  11:06 AM  PATIENT:  Erica Larson  42 y.o. female  PRE-OPERATIVE DIAGNOSIS:  PCOS, AUB  POST-OPERATIVE DIAGNOSIS:  PCOS, AUB  PROCEDURE:  Procedure(s): LAPAROSCOPIC ASSISTED VAGINAL HYSTERECTOMY WITH SALPINGECTOMY, mccalls culdoplasty (Bilateral) CYSTOSCOPY  SURGEON:  Surgeon(s) and Role:    * Dandre Sisler, Madelaine Etienne, MD - Primary    * Richarda Overlie, MD - Assisting  PHYSICIAN ASSISTANT:   ASSISTANTS: [place]   ANESTHESIA:   local and general  EBL:  75 mL   BLOOD ADMINISTERED:none  DRAINS: none   LOCAL MEDICATIONS USED:  MARCAINE    and Amount: 10 ml  SPECIMEN:  Source of Specimen:  uterus w/cervix, bilateral fallopian tubes  DISPOSITION OF SPECIMEN:  PATHOLOGY  COUNTS:  YES  TOURNIQUET:  * No tourniquets in log *  DICTATION: .Note written in EPIC  PLAN OF CARE: Admit for overnight observation  PATIENT DISPOSITION:  PACU - hemodynamically stable.   Delay start of Pharmacological VTE agent (>24hrs) due to surgical blood loss or risk of bleeding: not applicable

## 2017-12-29 NOTE — Op Note (Signed)
PREOPERATIVE DIAGNOSES: 1. AUB, PCOS, CIN 1  POSTOPERATIVE DIAGNOSES: 1. same  PROCEDURE PERFORMED: Laparoscopic-assisted total vaginal hysterectomy with bilateral salpingectomy, modified mccall's, cystoscopy  SURGEON: Dr. Lucillie Garfinkel ASSISTANT: Dr. Molli Posey  ANESTHESIA: General   ESTIMATED BLOOD LOSS: 75cc.  URINE OUTPUT: 200cc of clear urine at the end of the procedure.  FLUIDS: see Anesthesia record  COMPLICATIONS: None  TUBES: None.  DRAINS: None  PATHOLOGY: Uterus, cervix, bilateral fallopian tubes, were sent to pathology for review.  FINDINGS: On exam, under anesthesia, normal appearing vulva and vagina, a normal sized uterus that is sharply retroverted  Operative findings demonstrated a 6 week sized uterus. Normal fallopian tubes and ovaries bilaterally enlarged - mildly - c/w known PCOS. The bowel and omentum were normal appearing with some adhesive disease in left adnexa  Procedure: A general anesthesia was induced and the patient was placed in the dirsal lithotomy position. The abdomen, perineum, and vagina were prepped and draped in the usual fashion. A foley catheter inserted into the bladder and attached to straight drainage. After the initial preparation, the procedure commenced at the vagina. With a speculum in place to visualize the cervix, the cervix was grasped and a jacobs tenaculum was placed within the uterine cavity for manipulation purposes being careful not to puncture the uterus. Attention was then turned to the abdomen.    An infraumbilical incision was made and the Veress needle was gently advanced taking care to feel for the typical sensation of penetrating the peritoneum. With CO2 infiltration, an opening pressure of 3 mmHg was noted, and following this, a pneumoperitoneum of 15 mmHg was created. A 11 mm trocar was then passed through the same incision and the laparoscope was then inserted through the trocar sleeve.  Visualization of the  peritoneal cavity was then obtained and a brief inspection did not reveal any signs of complications from entry. Under direct observation, 51m suprapubic port was placed taking care to respect anatomical landmarks and vessels. Once the placement of the port was complete, the actual laparoscopic procedure began.   Ureters bilaterally were identified.  Beginning on the right side and distally along the length of the fallopian tube, the mesosalpinx was exposed by lifting the tube up towards the anterior abdominal wall. The mesosalpinx was then sequentially, clamped, ligated, and cut using the enseal working alongside the length of the tube and towards the cornua. Once the level of the cornua was reached, the tube was cut, removed through the port, and attention was then turned to the other side. The same process was repeated on the left, sequentially clamping, ligating, and cutting the mesosalpinx being sure to not injure the adjacent ovarian tissue or other surrounding structures. The round ligament was then ligated and cut. Following this, the anterior leaf of the broad ligament was then taken down on the left side, dissecting down towards the peritoneal reflection at the base of the bladder and adjacent to the cervix. The same process was then repeated on the left side such that both sides met and the anterior leaflet had been appropriately skeletonized. There was some adhesive disease on the left that was taken down sharply with good visualization of surrounding anatomy.   Attention was then turned to the vagina. Jacobs tenaculum was removed.  A weighted speculum was placed in the posterior vaginal vault. The cervix was grasped with a teneculum on both its anterior and posterior lips. 20cc of pitressin was injected. With downward traction, we made a circumferential incision of the vaginal  mucosa. This allowed dissection and entrance into the posterior cul-de-sac. The posterior peritoneum was sutured to the  posterior vaginal cuff. At this time we visualized the uterosacral ligaments. These were clamped and ligated with #0 Vicryl suture. The cervicovesical space was then created by both blunt and sharp dissection, allowing [place] to see the cardinal ligaments. These were clamped and ligated with #0 Vicryl suture as well. Once in the cervicovesical space, we were then able to completely ligate the uterosacral-cardinal ligament complex with #0 Vicryl suture. The anterior cul-de-sac was then entered sharply. Omentum and intestines were then visualized through the anterior cul-de-sac window and we began removal of the uterine body. The uterine arteries were then clamped and ligated with #0 Vicryl suture and carried upward toward the uterus until complete removal was obtained. Number 0 Vicryl suture was used on all major pedicles with Heaney clamping until the uterus was completely removed with cervix. We then removed the weighted duckbill speculum and placed a regular speculum in the vaginal vault and visualized the entire area. We inspected for hemostasis, and this was secured. A modified McCall's culdoplasty was performed by placing a stitch through the uterosacral ligament on the left, purstringing the posterior peritoneum and then placing a stitch through the right uterosacral ligament and tying it all together. Hemostasis was again noted. The vaginal cuff was closed with #0 vicryl in a running fashion.  A Foley catheter was then removed after noting clear urine. Cystoscopy was performed and a normal bladder survey with bilateral brisk ureteral jets were noted.  Attention then turned to the abdomen where all of the pedicles were noted to be hemostatic and cuff intact from above. Colon was meticulously inspected given retroverted uterus and no damage noted. Hemostasis was achieved - and confirmed on low flow. All gas removed from abdomen and all instruments were removed. Fascia was closed with 0' vicryl and skin closed  with 4'0 vicryl. The sponge and lap counts were correct times 2 at this time.   The patient's procedure was terminated. We then awakened her. She was sent to the Recovery Room in good condition.

## 2017-12-30 ENCOUNTER — Encounter (HOSPITAL_COMMUNITY): Payer: Self-pay | Admitting: Obstetrics and Gynecology

## 2017-12-30 DIAGNOSIS — E282 Polycystic ovarian syndrome: Secondary | ICD-10-CM | POA: Diagnosis not present

## 2017-12-30 LAB — CBC
HCT: 35.9 % — ABNORMAL LOW (ref 36.0–46.0)
HEMOGLOBIN: 12 g/dL (ref 12.0–15.0)
MCH: 29.7 pg (ref 26.0–34.0)
MCHC: 33.4 g/dL (ref 30.0–36.0)
MCV: 88.9 fL (ref 78.0–100.0)
Platelets: 357 10*3/uL (ref 150–400)
RBC: 4.04 MIL/uL (ref 3.87–5.11)
RDW: 13.5 % (ref 11.5–15.5)
WBC: 11.6 10*3/uL — ABNORMAL HIGH (ref 4.0–10.5)

## 2017-12-30 MED ORDER — MAGNESIUM HYDROXIDE 400 MG/5ML PO SUSP
15.0000 mL | Freq: Once | ORAL | Status: AC
  Administered 2017-12-30: 15 mL via ORAL
  Filled 2017-12-30: qty 30

## 2017-12-30 NOTE — Progress Notes (Signed)
Patient reports feeling fine overall - continues to eat normally. Had half a steak and steamed veggies for lunch. No emesis. However, still not passing flatus yet. Sill laughing, conversive, in NAD. Abdomen exam unremarkable - BS continue to be normoactive and abdomen nondistended.   CTM closely and await ROBF   Rosie Fate MD

## 2017-12-30 NOTE — Progress Notes (Signed)
1 Day Post-Op Procedure(s) (LRB): LAPAROSCOPIC ASSISTED VAGINAL HYSTERECTOMY WITH SALPINGECTOMY, mccalls culdoplasty (Bilateral) CYSTOSCOPY  Subjective: Patient reports nausea, tolerating PO and no problems voiding. She has had nausea since after the morphine last night but no emesis. She has been able to tolerate a full diet including dinner last night of pizza and has ordered breakfast this AM. No trouble voiding. Not yet passing flatus. Ambulated last night several times in the hall with husband. Belly is "sore" but pain tolerable with pain medications.   Objective: I have reviewed patient's vital signs, intake and output, medications and labs.  General: alert, cooperative and appears stated age Resp: clear to auscultation bilaterally and normal percussion bilaterally Cardio: regular rate and rhythm, S1, S2 normal, no murmur, click, rub or gallop GI: soft, non-tender; bowel sounds normal; no masses,  no organomegaly and incision: clean, dry and intact Extremities: extremities normal, atraumatic, no cyanosis or edema Vaginal Bleeding: none  Assessment: s/p Procedure(s): LAPAROSCOPIC ASSISTED VAGINAL HYSTERECTOMY WITH SALPINGECTOMY, mccalls culdoplasty (Bilateral) CYSTOSCOPY: stable, progressing well and tolerating diet. She had nausea after IV morphine but has been tolerating a general diet w/out emesis. Her abdomen is soft, appropriately tender, and w/out any distention. Her bowel sounds are normoactive.   Plan: Encourage ambulation Discontinue IV fluids Await flatus  LOS: 0 days    Erica Larson 12/30/2017, 8:28 AM

## 2017-12-31 DIAGNOSIS — E282 Polycystic ovarian syndrome: Secondary | ICD-10-CM | POA: Diagnosis not present

## 2017-12-31 LAB — CBC
HEMATOCRIT: 33.4 % — AB (ref 36.0–46.0)
HEMOGLOBIN: 11.1 g/dL — AB (ref 12.0–15.0)
MCH: 29.9 pg (ref 26.0–34.0)
MCHC: 33.2 g/dL (ref 30.0–36.0)
MCV: 90 fL (ref 78.0–100.0)
Platelets: 302 10*3/uL (ref 150–400)
RBC: 3.71 MIL/uL — AB (ref 3.87–5.11)
RDW: 13.8 % (ref 11.5–15.5)
WBC: 8 10*3/uL (ref 4.0–10.5)

## 2017-12-31 MED ORDER — IBUPROFEN 800 MG PO TABS: 800.0000 mg | ORAL_TABLET | Freq: Three times a day (TID) | ORAL | 0 refills | Status: DC | PRN

## 2017-12-31 MED ORDER — BISACODYL 10 MG RE SUPP
10.0000 mg | Freq: Once | RECTAL | Status: AC
  Administered 2017-12-31: 10 mg via RECTAL
  Filled 2017-12-31: qty 1

## 2017-12-31 NOTE — Progress Notes (Signed)
Patient discharged home. Ambulated to private vehicle.

## 2017-12-31 NOTE — Discharge Summary (Signed)
Physician Discharge Summary  Patient ID: Erica Larson MRN: [id number] DOB/AGE: [birth year] 42 y.o.  Admit date: 12/29/2017 Discharge date: 12/31/2017  Admission Diagnoses:  Discharge Diagnoses:  Active Problems:   S/P laparoscopic assisted vaginal hysterectomy (LAVH)   Discharged Condition: good  Hospital Course: Pt is a 42yo admitted for scheduled above surgery. She Had an uncomplicated LAVH BS mccalls and cysto with an EBL of 75cc. She had an uncomplicated postoperative course and on POD#2 was meeting all goals such as ambulating, passing flatus, voiding freely, and tolerating a general diet and she was deemed stable for discharge home. She also had a BM on day of discharge.   Consults: None  Significant Diagnostic Studies: labs:  CBC    Component Value Date/Time   WBC 8.0 12/31/2017 0544   RBC 3.71 (L) 12/31/2017 0544   HGB 11.1 (L) 12/31/2017 0544   HCT 33.4 (L) 12/31/2017 0544   PLT 302 12/31/2017 0544   MCV 90.0 12/31/2017 0544   MCH 29.9 12/31/2017 0544   MCHC 33.2 12/31/2017 0544   RDW 13.8 12/31/2017 0544   LYMPHSABS 2.0 01/02/2011 1723   MONOABS 0.4 01/02/2011 1723   EOSABS 0.1 01/02/2011 1723   BASOSABS 0.0 01/02/2011 1723    Treatments: surgery: LAVH BS mccalls cysto  Discharge Exam: Blood pressure 111/70, pulse 87, temperature 98.3 F (36.8 C), temperature source Oral, resp. rate 18, height 5' 3.5" (1.613 m), weight 71.2 kg (157 lb), SpO2 100 %.  Patient is smiling, conversive.  General appearance: alert, cooperative and appears stated age Back: symmetric, no curvature. ROM normal. No CVA tenderness. Resp: clear to auscultation bilaterally and normal percussion bilaterally Chest wall: no tenderness GI: soft, non-tender; bowel sounds normal; no masses,  no organomegaly and incisions c/d/i Pelvic: vagina normal without discharge Extremities: no edema, redness or tenderness in the calves or thighs  Disposition: 01-Home or Self Care  Discharge  Instructions    Call MD for:   Complete by:  As directed    Heavy vaginal bleeding or abnormal vaginal discharge   Call MD for:  difficulty breathing, headache or visual disturbances   Complete by:  As directed    Call MD for:  persistant dizziness or light-headedness   Complete by:  As directed    Call MD for:  persistant nausea and vomiting   Complete by:  As directed    Call MD for:  redness, tenderness, or signs of infection (pain, swelling, redness, odor or green/yellow discharge around incision site)   Complete by:  As directed    Call MD for:  severe uncontrolled pain   Complete by:  As directed    Call MD for:  temperature >100.4   Complete by:  As directed    Diet general   Complete by:  As directed    Discharge instructions   Complete by:  As directed    See notes   Driving Restrictions   Complete by:  As directed    Do not drive until you are off narcotic pain medications and you feel like you can react in an emergency.   Increase activity slowly   Complete by:  As directed    Leave dressing on - Keep it clean, dry, and intact until clinic visit   Complete by:  As directed    Lifting restrictions   Complete by:  As directed    Don't lift anything more than 15-20 pounds   Sexual Activity Restrictions   Complete by:  As directed  Nothing in the vagina x 2 to 6 weeks. We will discuss at clinic visit.     Allergies as of 12/31/2017      Reactions   Compazine [prochlorperazine] Other (See Comments)   Legs jumping    Oxycodone Itching      Medication List    STOP taking these medications   frovatriptan 2.5 MG tablet Commonly known as:  FROVA   HYDROcodone-acetaminophen 5-325 MG tablet Commonly known as:  NORCO/VICODIN   naratriptan 2.5 MG tablet Commonly known as:  AMERGE     TAKE these medications   ARIPiprazole 5 MG tablet Commonly known as:  ABILIFY Take 1 tablet (5 mg total) by mouth daily.   gabapentin 300 MG capsule Commonly known as:   NEURONTIN Take 1 capsule (300 mg total) 3 (three) times daily by mouth. What changed:    when to take this  reasons to take this   ibuprofen 800 MG tablet Commonly known as:  ADVIL,MOTRIN Take 1 tablet (800 mg total) by mouth every 8 (eight) hours as needed.   lamoTRIgine 150 MG tablet Commonly known as:  LAMICTAL Take 150 mg by mouth daily.   lamoTRIgine 25 MG tablet Commonly known as:  LAMICTAL Take 1 tablet (25 mg total) by mouth daily.   levothyroxine 50 MCG tablet Commonly known as:  SYNTHROID, LEVOTHROID Take 50 mcg by mouth daily before breakfast.   nitrofurantoin (macrocrystal-monohydrate) 100 MG capsule Commonly known as:  MACROBID Take 100 mg by mouth 2 (two) times daily.        Signed: Ranae Pila 12/31/2017, 12:34 PM

## 2018-01-11 ENCOUNTER — Encounter: Payer: Self-pay | Admitting: Obstetrics and Gynecology

## 2018-01-29 ENCOUNTER — Ambulatory Visit (HOSPITAL_COMMUNITY): Payer: Self-pay | Admitting: Psychiatry

## 2018-08-01 ENCOUNTER — Other Ambulatory Visit: Payer: Self-pay

## 2018-08-01 ENCOUNTER — Encounter (HOSPITAL_COMMUNITY): Payer: Self-pay

## 2018-08-01 ENCOUNTER — Ambulatory Visit (HOSPITAL_COMMUNITY)
Admission: EM | Admit: 2018-08-01 | Discharge: 2018-08-01 | Disposition: A | Discharge status: Home / Self Care | Payer: BLUE CROSS/BLUE SHIELD | Attending: Physician Assistant | Admitting: Physician Assistant

## 2018-08-01 DIAGNOSIS — M5417 Radiculopathy, lumbosacral region: Secondary | ICD-10-CM | POA: Diagnosis not present

## 2018-08-01 MED ORDER — PREDNISONE 20 MG PO TABS: ORAL_TABLET | ORAL | 0 refills | Status: AC

## 2018-08-01 MED ORDER — CYCLOBENZAPRINE HCL 10 MG PO TABS: 5.0000 mg | ORAL_TABLET | Freq: Three times a day (TID) | ORAL | 0 refills | Status: DC | PRN

## 2018-08-01 NOTE — ED Triage Notes (Signed)
Pt states she has right leg pain and sinus pressure. X 2 weeks. Pt also thinks she has a UTI.

## 2018-08-01 NOTE — ED Provider Notes (Signed)
08/01/2018 4:45 PM   DOB: [birth year] / MRN: [id number]  SUBJECTIVE:  Erica Larson is a 42 y.o. female presenting for right sided leg pain and numbness about the L2-L3 nerve distribution times two weeks.  Has tried ibuprofen 800 tid for about that same time with minimal relief. Denies loss of bowel or bladder control as well as saddle paresthesia.   Has facial pain with stuffy nose for 2 days. Started zyrtec and flonase today.   Erica Larson is allergic to compazine [prochlorperazine] and oxycodone.   Erica Larson  has a past medical history of Acne, Amenorrhea, Anxiety, Bipolar disorder (HCC), Depression, Hypothyroidism, Infertility, female, Migraine, Restless legs syndrome, SVD (spontaneous vaginal delivery), and Thyroiditis.    Erica Larson  reports that Erica Larson has never smoked. Erica Larson has never used smokeless tobacco. Erica Larson reports that Erica Larson drinks alcohol. Erica Larson reports that Erica Larson does not use drugs. Erica Larson  reports that Erica Larson currently engages in sexual activity. Erica Larson reports using the following method of birth control/protection: None. The patient  has a past surgical history that includes Dilation and curettage of uterus; Wisdom tooth extraction; Laparoscopic vaginal hysterectomy with salpingectomy (Bilateral, 12/29/2017); and Cystoscopy (12/29/2017).  Erica Larson family history includes Anxiety disorder in Erica Larson father; Drug abuse in Erica Larson father; Heart attack in Erica Larson mother; Heart disease in Erica Larson maternal grandmother; Nephrolithiasis in Erica Larson father and mother; Thyroid disease in Erica Larson mother and sister; Urolithiasis in Erica Larson father and mother.  Review of Systems  Constitutional: Negative for chills, diaphoresis and fever.  Eyes: Negative.   Respiratory: Negative for cough, hemoptysis, sputum production, shortness of breath and wheezing.   Cardiovascular: Negative for chest pain, orthopnea and leg swelling.  Gastrointestinal: Negative for abdominal pain, blood in stool, constipation, diarrhea, heartburn, melena, nausea and vomiting.  Genitourinary:  Negative for dysuria, flank pain, frequency, hematuria and urgency.  Skin: Negative for rash.  Neurological: Negative for dizziness, sensory change, speech change, focal weakness and headaches.    OBJECTIVE:  BP 104/69 (BP Location: Right Arm)   Pulse 70   Temp 97.8 F (36.6 C)   Resp 18   Wt 151 lb (68.5 kg)   LMP  (LMP Unknown) Comment: cont. bleeding since Dec 2018  SpO2 100%   BMI 26.33 kg/m   Wt Readings from Last 3 Encounters:  08/01/18 151 lb (68.5 kg)  12/29/17 157 lb (71.2 kg)  12/24/17 157 lb (71.2 kg)   Temp Readings from Last 3 Encounters:  08/01/18 97.8 F (36.6 C)  12/31/17 98.3 F (36.8 C) (Oral)  12/24/17 98.4 F (36.9 C) (Oral)   BP Readings from Last 3 Encounters:  08/01/18 104/69  12/31/17 111/70  12/24/17 108/82   Pulse Readings from Last 3 Encounters:  08/01/18 70  12/31/17 87  12/24/17 93    Physical Exam  Constitutional: Erica Larson is oriented to person, place, and time. Erica Larson appears well-nourished. No distress.  Eyes: Pupils are equal, round, and reactive to light. EOM are normal.  Cardiovascular: Normal rate.  Pulmonary/Chest: Effort normal.  Abdominal: Erica Larson exhibits no distension.  Musculoskeletal: Normal range of motion. Erica Larson exhibits tenderness (Right lumbar paraspinals down into the piriformis).  Neurological: Erica Larson is alert and oriented to person, place, and time. Erica Larson displays normal reflexes. No cranial nerve deficit or sensory deficit. Erica Larson exhibits normal muscle tone. Coordination and gait normal.  Skin: Skin is dry. No rash noted. Erica Larson is not diaphoretic. No erythema. No pallor.  Psychiatric: Erica Larson has a normal mood and affect.  Vitals reviewed.   No results found for this  or any previous visit (from the past 72 hour(s)).  No results found.  ASSESSMENT AND PLAN:   Radiculitis, lumbosacral    Discharge Instructions     Start the pred and flexeril today.  Avoid nsaids while taking prednisone.    Continue zyrtec and flonase for now.   Next step to add would be pseudoephedrine.         The patient is advised to call or return to clinic if Erica Larson does not see an improvement in symptoms, or to seek the care of the closest emergency department if Erica Larson worsens with the above plan.   Deliah Boston, MHS, PA-C 08/01/2018 4:45 PM   Ofilia Neas, PA-C 08/01/18 1645

## 2018-08-01 NOTE — Discharge Instructions (Signed)
Start the pred and flexeril today.  Avoid nsaids while taking prednisone.    Continue zyrtec and flonase for now.  Next step to add would be pseudoephedrine.

## 2018-09-04 ENCOUNTER — Encounter (HOSPITAL_COMMUNITY): Payer: Self-pay | Admitting: Emergency Medicine

## 2018-09-04 ENCOUNTER — Ambulatory Visit (HOSPITAL_COMMUNITY)
Admission: EM | Admit: 2018-09-04 | Discharge: 2018-09-04 | Disposition: A | Discharge status: Home / Self Care | Payer: BLUE CROSS/BLUE SHIELD | Attending: Family Medicine | Admitting: Family Medicine

## 2018-09-04 DIAGNOSIS — R350 Frequency of micturition: Secondary | ICD-10-CM

## 2018-09-04 DIAGNOSIS — Z8249 Family history of ischemic heart disease and other diseases of the circulatory system: Secondary | ICD-10-CM | POA: Insufficient documentation

## 2018-09-04 DIAGNOSIS — Z8349 Family history of other endocrine, nutritional and metabolic diseases: Secondary | ICD-10-CM | POA: Diagnosis not present

## 2018-09-04 DIAGNOSIS — Z79890 Hormone replacement therapy (postmenopausal): Secondary | ICD-10-CM | POA: Diagnosis not present

## 2018-09-04 DIAGNOSIS — E039 Hypothyroidism, unspecified: Secondary | ICD-10-CM | POA: Insufficient documentation

## 2018-09-04 DIAGNOSIS — F319 Bipolar disorder, unspecified: Secondary | ICD-10-CM | POA: Diagnosis not present

## 2018-09-04 DIAGNOSIS — R300 Dysuria: Secondary | ICD-10-CM | POA: Diagnosis not present

## 2018-09-04 DIAGNOSIS — Z791 Long term (current) use of non-steroidal anti-inflammatories (NSAID): Secondary | ICD-10-CM | POA: Insufficient documentation

## 2018-09-04 DIAGNOSIS — M25551 Pain in right hip: Secondary | ICD-10-CM | POA: Diagnosis not present

## 2018-09-04 DIAGNOSIS — G43909 Migraine, unspecified, not intractable, without status migrainosus: Secondary | ICD-10-CM | POA: Diagnosis not present

## 2018-09-04 DIAGNOSIS — G8929 Other chronic pain: Secondary | ICD-10-CM | POA: Diagnosis not present

## 2018-09-04 DIAGNOSIS — Z885 Allergy status to narcotic agent status: Secondary | ICD-10-CM | POA: Diagnosis not present

## 2018-09-04 DIAGNOSIS — Z888 Allergy status to other drugs, medicaments and biological substances status: Secondary | ICD-10-CM | POA: Diagnosis not present

## 2018-09-04 DIAGNOSIS — Z79899 Other long term (current) drug therapy: Secondary | ICD-10-CM | POA: Diagnosis not present

## 2018-09-04 LAB — POCT URINALYSIS DIP (DEVICE)
Bilirubin Urine: NEGATIVE
Glucose, UA: NEGATIVE mg/dL
Ketones, ur: NEGATIVE mg/dL
NITRITE: POSITIVE — AB
PH: 5.5 (ref 5.0–8.0)
PROTEIN: NEGATIVE mg/dL
Specific Gravity, Urine: 1.025 (ref 1.005–1.030)
Urobilinogen, UA: 0.2 mg/dL (ref 0.0–1.0)

## 2018-09-04 MED ORDER — METHYLPREDNISOLONE ACETATE 80 MG/ML IJ SUSP
80.0000 mg | Freq: Once | INTRAMUSCULAR | Status: AC
Start: 2018-09-04 — End: 2018-09-04
  Administered 2018-09-04: 80 mg via INTRAMUSCULAR

## 2018-09-04 MED ORDER — METHYLPREDNISOLONE ACETATE 80 MG/ML IJ SUSP
INTRAMUSCULAR | Status: AC
Start: 2018-09-04 — End: ?
  Filled 2018-09-04: qty 1

## 2018-09-04 MED ORDER — NITROFURANTOIN MONOHYD MACRO 100 MG PO CAPS: 100.0000 mg | ORAL_CAPSULE | Freq: Two times a day (BID) | ORAL | 0 refills | Status: AC

## 2018-09-04 NOTE — ED Notes (Signed)
Bed: UC01 Expected date: 09/04/18 Expected time:  Means of arrival:  Comments: For APPTS

## 2018-09-04 NOTE — Discharge Instructions (Signed)
Urine showed evidence of infection. We are treating you with macrobid. Be sure to take full course. Stay hydrated- urine should be pale yellow to clear. My continue azo for relief of burning while infection is being cleared.   Please return or follow up with your primary provider if symptoms not improving with treatment. Please return sooner if you have worsening of symptoms or develop fever, nausea, vomiting, abdominal pain, back pain, lightheadedness, dizziness.  For your hip pain/bursitis we gave you Depo-Medrol today; continue anti-inflammatories for continued pain management at this Use anti-inflammatories for pain/swelling. You may take up to 800 mg Ibuprofen every 8 hours with food. You may supplement Ibuprofen with Tylenol [phone] mg every 8 hours.   Follow-up with primary care for further evaluation and treatment of persistent pain. Return here if pain worsening, developing weakness, loss of bowel or bladder control, numbness between thighs/groin

## 2018-09-04 NOTE — ED Provider Notes (Signed)
[facility]    CSN: [id number] Arrival date & time: 09/04/18  1456     History   Chief Complaint No chief complaint on file.   HPI Erica Larson is a 42 y.o. female history of type II bipolar, hypothyroidism, anxiety presenting today for evaluation of possible UTI and hip pain.  Patient states that she developed dysuria, increased frequency, suprapubic pressure and spasming with urination.  Symptoms began last night.  Typically has a UTI twice a year.  Feels like a typical UTI.  Denies fever, nausea, vomiting, back pain.  Denies vaginal symptoms of discharge, itching or irritation.  Previous hysterectomy, no longer having cycles.  Patient also has had some chronic hip pain, she was seen here previously and treated with prednisone, did not have improvement with prednisone.  States that in the past she has had steroid injections which has helped this pain.  Denies weakness in legs or specific injury.  Denies back pain.  Denies numbness, tingling, denies saddle anesthesia, denies loss of bowel or bladder control.  HPI  Past Medical History:  Diagnosis Date  . Acne   . Amenorrhea   . Anxiety   . Bipolar disorder (HCC)    type 2  . Depression   . Hypothyroidism   . Infertility, female    treatment with oligammenorhea  Dr Chevis Pretty   . Migraine   . Restless legs syndrome    currently no problems per patient  . SVD (spontaneous vaginal delivery)    x 1  . Thyroiditis     Patient Active Problem List   Diagnosis Date Noted  . S/P laparoscopic assisted vaginal hysterectomy (LAVH) 12/29/2017  . Restless leg syndrome 11/05/2015  . Bipolar 2 disorder, major depressive episode (HCC) 11/04/2015  . Anxiety state, unspecified 09/27/2013  . Fatigue 01/02/2011  . VITAMIN D DEFICIENCY 01/05/2009  . Hypercalcemia 01/05/2009  . NUMBNESS 01/05/2009  . THYROIDITIS 01/02/2009  . PALPITATIONS, RECURRENT 01/02/2009  . SINUSITIS- ACUTE-NOS 08/15/2008  . FEMALE INFERTILITY  07/18/2008  . WEIGHT GAIN 07/18/2008  . ACUTE TONSILLITIS 12/30/2007  . URI 12/30/2007  . MIGRAINE HEADACHE 08/12/2007  . AMENORRHEA 08/12/2007    Past Surgical History:  Procedure Laterality Date  . CYSTOSCOPY  12/29/2017   Procedure: CYSTOSCOPY;  Surgeon: Ranae Pila, MD;  Location: [facility];  Service: Gynecology;;  . DILATION AND CURETTAGE OF UTERUS    . LAPAROSCOPIC VAGINAL HYSTERECTOMY WITH SALPINGECTOMY Bilateral 12/29/2017   Procedure: LAPAROSCOPIC ASSISTED VAGINAL HYSTERECTOMY WITH SALPINGECTOMY, mccalls culdoplasty;  Surgeon: Ranae Pila, MD;  Location: [facility];  Service: Gynecology;  Laterality: Bilateral;  . WISDOM TOOTH EXTRACTION      OB History    Gravida  3   Para  1   Term      Preterm  1   AB  2   Living  1     SAB      TAB      Ectopic  2   Multiple      Live Births               Home Medications    Prior to Admission medications   Medication Sig Start Date End Date Taking? Authorizing Provider  ARIPiprazole (ABILIFY) 5 MG tablet Take 1 tablet (5 mg total) by mouth daily. 10/29/17 10/29/18  Benjaman Pott, MD  gabapentin (NEURONTIN) 300 MG capsule Take 1 capsule (300 mg total) 3 (three) times daily by mouth. Patient taking differently: Take 300 mg by  mouth 3 (three) times daily as needed (for migraines).  09/23/17   Anson Fret, MD  ibuprofen (ADVIL,MOTRIN) 800 MG tablet Take 1 tablet (800 mg total) by mouth every 8 (eight) hours as needed. 12/31/17   Ranae Pila, MD  levothyroxine (SYNTHROID, LEVOTHROID) 50 MCG tablet Take 50 mcg by mouth daily before breakfast.  09/10/13   [provider]  nitrofurantoin, macrocrystal-monohydrate, (MACROBID) 100 MG capsule Take 1 capsule (100 mg total) by mouth 2 (two) times daily for 5 days. 09/04/18 09/09/18  Wieters, Junius Creamer, PA-C    Family History Family History  Problem Relation Age of Onset  . Thyroid disease Mother   . Nephrolithiasis Mother   .  Urolithiasis Mother   . Heart attack Mother        signs but neg eval  . Nephrolithiasis Father   . Urolithiasis Father   . Anxiety disorder Father   . Drug abuse Father   . Thyroid disease Sister   . Heart disease Maternal Grandmother     Social History Social History   Tobacco Use  . Smoking status: Never Smoker  . Smokeless tobacco: Never Used  Substance Use Topics  . Alcohol use: Yes    Comment: Rarely  . Drug use: No     Allergies   Compazine [prochlorperazine] and Oxycodone   Review of Systems Review of Systems  Constitutional: Negative for fever.  Respiratory: Negative for shortness of breath.   Cardiovascular: Negative for chest pain.  Gastrointestinal: Negative for abdominal pain, diarrhea, nausea and vomiting.  Genitourinary: Positive for dysuria and frequency. Negative for flank pain, genital sores, hematuria, menstrual problem, vaginal bleeding, vaginal discharge and vaginal pain.  Musculoskeletal: Positive for arthralgias and myalgias. Negative for back pain.  Skin: Negative for rash.  Neurological: Negative for dizziness, light-headedness and headaches.     Physical Exam Triage Vital Signs ED Triage Vitals  Enc Vitals Group     BP 09/04/18 1508 117/71     Pulse Rate 09/04/18 1508 83     Resp 09/04/18 1508 16     Temp 09/04/18 1508 98.2 F (36.8 C)     Temp Source 09/04/18 1508 Oral     SpO2 09/04/18 1508 97 %     Weight --      Height --      Head Circumference --      Peak Flow --      Pain Score 09/04/18 1512 7     Pain Loc --      Pain Edu? --      Excl. in GC? --    No data found.  Updated Vital Signs BP 117/71 (BP Location: Left Arm)   Pulse 83   Temp 98.2 F (36.8 C) (Oral)   Resp 16   LMP  (LMP Unknown) Comment: cont. bleeding since Dec 2018  SpO2 97%   Visual Acuity Right Eye Distance:   Left Eye Distance:   Bilateral Distance:    Right Eye Near:   Left Eye Near:    Bilateral Near:     Physical Exam    Constitutional: She appears well-developed and well-nourished. No distress.  HENT:  Head: Normocephalic and atraumatic.  Eyes: Conjunctivae are normal.  Neck: Neck supple.  Cardiovascular: Normal rate and regular rhythm.  No murmur heard. Pulmonary/Chest: Effort normal and breath sounds normal. No respiratory distress.  Abdominal: Soft. There is no tenderness.  Nontender light deep palpation throughout all 4 quadrants, negative CVA tenderness  Musculoskeletal:  She exhibits no edema.  Minimal tenderness over right trochanteric bursa, tenderness increases is moving more anteriorly onto proximal thigh Full active range of motion of hips, strength 5/5  Neurological: She is alert.  Skin: Skin is warm and dry.  Psychiatric: She has a normal mood and affect.  Nursing note and vitals reviewed.    UC Treatments / Results  Labs (all labs ordered are listed, but only abnormal results are displayed) Labs Reviewed  POCT URINALYSIS DIP (DEVICE) - Abnormal; Notable for the following components:      Result Value   Hgb urine dipstick SMALL (*)    Nitrite POSITIVE (*)    Leukocytes, UA TRACE (*)    All other components within normal limits  URINE CULTURE    EKG None  Radiology No results found.  Procedures Procedures (including critical care time)  Medications Ordered in UC Medications  methylPREDNISolone acetate (DEPO-MEDROL) injection 80 mg (has no administration in time range)    Initial Impression / Assessment and Plan / UC Course  I have reviewed the triage vital signs and the nursing notes.  Pertinent labs & imaging results that were available during my care of the patient were reviewed by me and considered in my medical decision making (see chart for details).     Positive nitrites, large leuks, will treat for UTI with Macrobid twice daily x5 days.  Vital signs stable, no CVA tenderness, unlikely pyelonephritis.  Patient with persistent chronic hip pain, point of  maximal tenderness is not directly overlying bursa.  Will try Depo-Medrol injection as alternative to this as she is previously improved with steroids.  Do not feel direct injection would help as tenderness not directly over the bursa.  Will have patient follow-up with PCP, Alice Rieger over here for further evaluation and management of this pain.  Discussed strict return precautions. Patient verbalized understanding and is agreeable with plan.  Final Clinical Impressions(s) / UC Diagnoses   Final diagnoses:  Dysuria  Increased frequency of urination  Right hip pain     Discharge Instructions     Urine showed evidence of infection. We are treating you with macrobid. Be sure to take full course. Stay hydrated- urine should be pale yellow to clear. My continue azo for relief of burning while infection is being cleared.   Please return or follow up with your primary provider if symptoms not improving with treatment. Please return sooner if you have worsening of symptoms or develop fever, nausea, vomiting, abdominal pain, back pain, lightheadedness, dizziness.  For your hip pain/bursitis we gave you Depo-Medrol today; continue anti-inflammatories for continued pain management at this Use anti-inflammatories for pain/swelling. You may take up to 800 mg Ibuprofen every 8 hours with food. You may supplement Ibuprofen with Tylenol [phone] mg every 8 hours.   Follow-up with primary care for further evaluation and treatment of persistent pain. Return here if pain worsening, developing weakness, loss of bowel or bladder control, numbness between thighs/groin    ED Prescriptions    Medication Sig Dispense Auth. Provider   nitrofurantoin, macrocrystal-monohydrate, (MACROBID) 100 MG capsule Take 1 capsule (100 mg total) by mouth 2 (two) times daily for 5 days. 10 capsule Wieters, Hallie C, PA-C     Controlled Substance Prescriptions [place] Controlled Substance Registry consulted? Not Applicable   Lew Dawes, [place] 09/04/18 1610

## 2018-09-04 NOTE — ED Triage Notes (Signed)
Pt presents with pressure and spasm when she urinates; began last night, pt states she has UTI at least twice a year.

## 2018-09-06 LAB — URINE CULTURE

## 2018-09-07 ENCOUNTER — Telehealth: Payer: Self-pay | Admitting: Emergency Medicine

## 2018-09-07 NOTE — Telephone Encounter (Signed)
Called Patient, demo verified results communicated, patient voiced understanding and states she feels much better.  Will finish meds. And follow-up as needed. nmw

## 2018-10-06 ENCOUNTER — Other Ambulatory Visit: Payer: Self-pay | Admitting: Neurology

## 2018-10-12 ENCOUNTER — Telehealth: Payer: Self-pay | Admitting: Neurology

## 2018-10-12 MED ORDER — GABAPENTIN 300 MG PO CAPS: 300.0000 mg | ORAL_CAPSULE | Freq: Three times a day (TID) | ORAL | 3 refills | Status: DC

## 2018-10-12 NOTE — Telephone Encounter (Signed)
Med refilled to [facility] with enough refills to last to next appt in March 2020.

## 2018-10-12 NOTE — Telephone Encounter (Signed)
Pt has scheduled her 1 year f/u and is now asking for a refill on her gabapentin (NEURONTIN) 300 MG capsule [facility], [place]. - [place], [place] - [address] [phone] (Phone) [phone] (Fax)

## 2018-10-12 NOTE — Addendum Note (Signed)
Addended by: Bertram Savin on: 10/12/2018 04:46 PM   Modules accepted: Orders

## 2018-12-01 ENCOUNTER — Ambulatory Visit: Payer: BLUE CROSS/BLUE SHIELD | Admitting: Orthopaedic Surgery

## 2018-12-22 ENCOUNTER — Encounter: Payer: Self-pay | Admitting: Orthopaedic Surgery

## 2018-12-22 ENCOUNTER — Ambulatory Visit: Payer: BLUE CROSS/BLUE SHIELD | Admitting: Orthopaedic Surgery

## 2019-01-18 ENCOUNTER — Ambulatory Visit: Payer: Self-pay | Admitting: Neurology

## 2019-01-18 NOTE — Progress Notes (Deleted)
[facility]    Provider:  Dr Jaynee Eagles Referring Provider: No ref. provider found Primary Care Physician:  Denyce Robert, FNP  CC:  Muscle aches, joint pain, fatigue, MS, intractable headache  Interval history 09/23/2017: Migraines since childhood  But worsened in April, no inciting event, she stopped drinking softdrinks but then they improved. Last 3 months worsening. She has daily headaches. Some days they are worse than others. She has pounding on the right side of the head, pulsating, she gets a black line sometimes in the left eye but no consistent aura, behind the eye, she has light sensitivity when they are moderate to severe, nausea, occ she has to sit in a dark room, if sh emoves it makes it worse. She has had a migraine for 2 weeks, improved with the migraine cocktail, she had a reaction to compazine. Daily they are moderate in severity and they can get 8-9/10, 1-2 days a week can be severe, she has missed work, No medication overuse, No aura, ongoing since April. She does not take hydrocodone often. After breast reduction, discussed integrative therapies.   Medications tried: Gabapentin, Lamotrigine, wellbutrin, abilify, lexapro,   HPI:  Erica Larson is a 43 y.o. female here as a referral from Dr. Gerarda Fraction. Past medical history of obesity, hypothyroidism, polyarthralgia and myalgia, chronic anxiety, osteoarthritis. Family history of lupus. Patient says she is her for  evaluation of MS. She has numbness and tinlging in the botom of the feet. She is off balance. Her joints are aching. She feels exhausted. Underneath her nose and across the nose she had a rash on her face like in Lupus. Symptoms started 6 weeks ago, she started getting more tired and her bones and joints started aching in her legs.  Pain in the joints and up into the arms and in the anteior thighs. She feels like someone is stabbing the bottom of her heet in the morning. She has had the tinlging in her feet  off and on for the last several years without progression but all other symptoms are new. Also with Muscle aching and  generalized weakness. She had an episode of slurred speech, not in the setting of a headache, she hadn;t felt good that day and all of a sudden her words wouldn;t come out, she went ot the [facility] urgent care and they said she had weakness on one side. Her mother has MS and sees Dr. Jannifer Franklin in our group. Patient has vision changes in the right eye. She also has headaches. She was very sick for 5 months previous to symptoms, sinus problems. No other focal neurologic deficits or complaints. Patient has headaches and these have not significantly changed, these ar not concerning to her she is more worried about her recent symptoms.  Reviewed notes, labs and imaging from outside physicians, which showed:  Patient was seen at [facility] on 05/31/2016 for numbness in both sides legs and arms, headaches, blurred vision and being tired for 3 days. She also reported blurring of vision, dizziness, numbness and tingling in the left upper extremity, numbness and tingling in the right upper chin many, fatigue, lethargic, feeling of being drunk. Examination including head, eyes, ears, nose, throat, neck, cardiovascular, lungs, abdomen, skin, extremities and neurologic workup were negative. Patient was sent to the emergency room.( I don't have records from the emergency room, will request.)  BUN 17, creatinine is 0.740 labs drawn 06/04/2016.  Review primary care notes. At last appointment at primary care it  appears patient had extensive testing ordered including ANA, CBC, CMP, ESR, folate, Lyme, lipid, rheumatoid factor, thyroid, B12, vitamin D, bilirubin. (I don't have results, we'll request, per patient results are normal)  Review previous notes in Epic. Previous history of overdosing on medications including clonazepam, Ambien and Requip at the end of 2016. She was admitted  to the [facility] for 2 days. She was diagnosed as possibly bipolar d/o. This is an unclear diagnosis. Her Latuda was later changed to Lexapro and Wellbutrin and she enrolled in counseling and recent diagnoses include depression and generalized anxiety and not bipolar disorder. Patient has been treated for mood disorder since 2014.  Review of Systems: Patient complains of symptoms per HPI as well as the following symptoms: headache. Pertinent negatives and positives per HPI. All others negative.   Social History   Socioeconomic History  . Marital status: Married    Spouse name: [name]  . Number of children: 1  . Years of education: Some college  . Highest education level: Not on file  Occupational History  . Occupation: Engineer, maintenance  . Occupation: Private sitting  Social Needs  . Financial resource strain: Not on file  . Food insecurity:    Worry: Not on file    Inability: Not on file  . Transportation needs:    Medical: Not on file    Non-medical: Not on file  Tobacco Use  . Smoking status: Never Smoker  . Smokeless tobacco: Never Used  Substance and Sexual Activity  . Alcohol use: Yes    Comment: Rarely  . Drug use: No  . Sexual activity: Yes    Birth control/protection: None  Lifestyle  . Physical activity:    Days per week: Not on file    Minutes per session: Not on file  . Stress: Not on file  Relationships  . Social connections:    Talks on phone: Not on file    Gets together: Not on file    Attends religious service: Not on file    Active member of club or organization: Not on file    Attends meetings of clubs or organizations: Not on file    Relationship status: Not on file  . Intimate partner violence:    Fear of current or ex partner: Not on file    Emotionally abused: Not on file    Physically abused: Not on file    Forced sexual activity: Not on file  Other Topics Concern  . Not on file  Social History Narrative   Lives with  husband   hhof 3 married non smoker     Daughter husband and pet dog   Caffeine use: Diet coke    Family History  Problem Relation Age of Onset  . Thyroid disease Mother   . Nephrolithiasis Mother   . Urolithiasis Mother   . Heart attack Mother        signs but neg eval  . Nephrolithiasis Father   . Urolithiasis Father   . Anxiety disorder Father   . Drug abuse Father   . Thyroid disease Sister   . Heart disease Maternal Grandmother     Past Medical History:  Diagnosis Date  . Acne   . Amenorrhea   . Anxiety   . Bipolar disorder ([place])    type 2  . Depression   . Hypothyroidism   . Infertility, female    treatment with oligammenorhea  Dr Carren Rang   . Migraine   .  Restless legs syndrome    currently no problems per patient  . SVD (spontaneous vaginal delivery)    x 1  . Thyroiditis     Past Surgical History:  Procedure Laterality Date  . CYSTOSCOPY  12/29/2017   Procedure: CYSTOSCOPY;  Surgeon: Tyson Dense, MD;  Location: [facility];  Service: Gynecology;;  . DILATION AND CURETTAGE OF UTERUS    . LAPAROSCOPIC VAGINAL HYSTERECTOMY WITH SALPINGECTOMY Bilateral 12/29/2017   Procedure: LAPAROSCOPIC ASSISTED VAGINAL HYSTERECTOMY WITH SALPINGECTOMY, mccalls culdoplasty;  Surgeon: Tyson Dense, MD;  Location: [facility];  Service: Gynecology;  Laterality: Bilateral;  . WISDOM TOOTH EXTRACTION      Current Outpatient Medications  Medication Sig Dispense Refill  . ARIPiprazole (ABILIFY) 5 MG tablet Take 1 tablet (5 mg total) by mouth daily. 30 tablet 2  . gabapentin (NEURONTIN) 300 MG capsule Take 1 capsule (300 mg total) by mouth 3 (three) times daily. 90 capsule 3  . ibuprofen (ADVIL,MOTRIN) 800 MG tablet Take 1 tablet (800 mg total) by mouth every 8 (eight) hours as needed. 30 tablet 0  . levothyroxine (SYNTHROID, LEVOTHROID) 50 MCG tablet Take 50 mcg by mouth daily before breakfast.      No current facility-administered medications for this visit.      Allergies as of 01/18/2019 - Review Complete 09/04/2018  Allergen Reaction Noted  . Compazine [prochlorperazine] Other (See Comments) 09/23/2017  . Oxycodone Itching 12/22/2017    Vitals: LMP  (LMP Unknown) Comment: cont. bleeding since Dec 2018 Last Weight:  Wt Readings from Last 1 Encounters:  08/01/18 151 lb (68.5 kg)   Last Height:   Ht Readings from Last 1 Encounters:  12/29/17 5' 3.5" (1.613 m)     Physical exam: Exam: Gen: NAD, conversant, well nourised, well groomed                     CV: RRR, no MRG. No Carotid Bruits. No peripheral edema, warm, nontender Eyes: Conjunctivae clear without exudates or hemorrhage  Neuro: Detailed Neurologic Exam  Speech:    Speech is normal; fluent and spontaneous with normal comprehension.  Cognition:    The patient is oriented to person, place, and time;     recent and remote memory intact;     language fluent;     normal attention, concentration,     fund of knowledge Cranial Nerves:    The pupils are equal, round, and reactive to light. The fundi are normal and spontaneous venous pulsations are present. Visual fields are full to finger confrontation. Extraocular movements are intact. Trigeminal sensation is intact and the muscles of mastication are normal. The face is symmetric. The palate elevates in the midline. Hearing intact. Voice is normal. Shoulder shrug is normal. The tongue has normal motion without fasciculations.   Coordination:    Normal finger to nose and heel to shin. Normal rapid alternating movements.   Gait:    Heel-toe and tandem gait are normal.   Motor Observation:    No asymmetry, no atrophy, and no involuntary movements noted. Tone:    Normal muscle tone.    Posture:    Posture is normal. normal erect    Strength:    Strength is V/V in the upper and lower limbs.      Sensation: hemisensory loss left     Reflex Exam:  DTR's:    Deep tendon reflexes in the upper and lower extremities are  normal bilaterally.   Toes:    The toes  are downgoing bilaterally.   Clonus:    Clonus is absent.   Assessment/Plan:  43 y.o. female here as a referral from Dr. Gerarda Fraction. Past medical history of obesity, hypothyroidism, polyarthralgia and myalgia, chronic anxiety, osteoarthritis, intractable positional headache,. Family history of lupus.  Patient with acute continuous headache, intractable, positional in quality with a Hx of MS, paresthesias, vision changes, weakness, . Need MRI of the brain to evaluate for new lesion, MS, space occupying mass especially given positional quality and MRI cervical spine to eval for new event in MS for paresthesias, weakness, aphasia. Frova x 5 days for headaches which may be migraines if above unremarkable.  Emg/ncs right arm and leg for paresthesias, weakness  Steroids and frova x 6 days, gabapentin  Cc: Dr. Gerarda Fraction   No orders of the defined types were placed in this encounter.     Sarina Ill, MD  [facility] [address] [place], [place] [address]  Phone [phone] Fax [phone]  A total of 40 minutes was spent face-to-face with this patient. Over half this time was spent on counseling patient on the following items:   Problem List Items Addressed This Visit    None    diagnosis and different diagnostic and therapeutic options available.

## 2019-01-19 ENCOUNTER — Encounter: Payer: Self-pay | Admitting: Neurology

## 2019-01-30 ENCOUNTER — Ambulatory Visit: Admit: 2019-01-30 | Discharge status: Absent without leave | Payer: BLUE CROSS/BLUE SHIELD

## 2019-04-09 ENCOUNTER — Ambulatory Visit (HOSPITAL_COMMUNITY)
Admission: EM | Admit: 2019-04-09 | Discharge: 2019-04-09 | Disposition: A | Discharge status: Home / Self Care | Payer: Self-pay | Attending: Internal Medicine | Admitting: Internal Medicine

## 2019-04-09 ENCOUNTER — Encounter (HOSPITAL_COMMUNITY): Payer: Self-pay | Admitting: Emergency Medicine

## 2019-04-09 ENCOUNTER — Other Ambulatory Visit: Payer: Self-pay

## 2019-04-09 ENCOUNTER — Ambulatory Visit (INDEPENDENT_AMBULATORY_CARE_PROVIDER_SITE_OTHER): Payer: Self-pay

## 2019-04-09 DIAGNOSIS — R300 Dysuria: Secondary | ICD-10-CM

## 2019-04-09 DIAGNOSIS — Z113 Encounter for screening for infections with a predominantly sexual mode of transmission: Secondary | ICD-10-CM | POA: Insufficient documentation

## 2019-04-09 DIAGNOSIS — M25559 Pain in unspecified hip: Secondary | ICD-10-CM | POA: Insufficient documentation

## 2019-04-09 LAB — POCT URINALYSIS DIP (DEVICE)
Bilirubin Urine: NEGATIVE
Glucose, UA: NEGATIVE mg/dL
Hgb urine dipstick: NEGATIVE
Ketones, ur: NEGATIVE mg/dL
Leukocytes,Ua: NEGATIVE
Nitrite: NEGATIVE
Protein, ur: NEGATIVE mg/dL
Specific Gravity, Urine: >=1.03 (ref 1.005–1.030)
Urobilinogen, UA: 0.2 mg/dL (ref 0.0–1.0)
pH: 5.5 (ref 5.0–8.0)

## 2019-04-09 MED ORDER — HYDROCODONE-ACETAMINOPHEN 5-325 MG PO TABS: 1.0000 | ORAL_TABLET | Freq: Four times a day (QID) | ORAL | 0 refills | Status: DC | PRN

## 2019-04-09 MED ORDER — MELOXICAM 7.5 MG PO TABS: 7.5000 mg | ORAL_TABLET | Freq: Every day | ORAL | 0 refills | Status: DC

## 2019-04-09 NOTE — ED Provider Notes (Signed)
[facility]    CSN: [id number] Arrival date & time: 04/09/19  1629     History   Chief Complaint Chief Complaint  Patient presents with  . Dysuria    HPI Erica Larson is a 43 y.o. female.   Erica Larson presents with multiple complaints. She requests a screen for STD's as she found out her husband has been with another partner. Denies any specific vaginal symptoms at this time. Declines HIV and RPR screening. Also complaints of discomfort with urination. This started approximately 2 days ago. No frequency or urgency. No back pain. No fevers, no gi complaints. No blood in urine. Further, complaints of right hip pain. Noted over the past few days. Has had before, improved with steroid injection in the past. No specific injury. Worse at night. Has tried ibuprofen and tramadol which haven't helped. No fevers. No weakness, no numbness or tingling. No radiation of pain. Hx of anxiety, depression, bipolar, hypothyroidism.     ROS per HPI, negative if not otherwise mentioned.      Past Medical History:  Diagnosis Date  . Acne   . Amenorrhea   . Anxiety   . Bipolar disorder ([facility])    type 2  . Depression   . Hypothyroidism   . Infertility, female    treatment with oligammenorhea  Dr Chevis Pretty   . Migraine   . Restless legs syndrome    currently no problems per patient  . SVD (spontaneous vaginal delivery)    x 1  . Thyroiditis     Patient Active Problem List   Diagnosis Date Noted  . S/P laparoscopic assisted vaginal hysterectomy (LAVH) 12/29/2017  . Restless leg syndrome 11/05/2015  . Bipolar 2 disorder, major depressive episode (HCC) 11/04/2015  . Anxiety state, unspecified 09/27/2013  . Fatigue 01/02/2011  . VITAMIN D DEFICIENCY 01/05/2009  . Hypercalcemia 01/05/2009  . NUMBNESS 01/05/2009  . THYROIDITIS 01/02/2009  . PALPITATIONS, RECURRENT 01/02/2009  . SINUSITIS- ACUTE-NOS 08/15/2008  . FEMALE INFERTILITY 07/18/2008  . WEIGHT GAIN 07/18/2008  .  ACUTE TONSILLITIS 12/30/2007  . URI 12/30/2007  . MIGRAINE HEADACHE 08/12/2007  . AMENORRHEA 08/12/2007    Past Surgical History:  Procedure Laterality Date  . CYSTOSCOPY  12/29/2017   Procedure: CYSTOSCOPY;  Surgeon: Ranae Pila, MD;  Location: [facility];  Service: Gynecology;;  . DILATION AND CURETTAGE OF UTERUS    . LAPAROSCOPIC VAGINAL HYSTERECTOMY WITH SALPINGECTOMY Bilateral 12/29/2017   Procedure: LAPAROSCOPIC ASSISTED VAGINAL HYSTERECTOMY WITH SALPINGECTOMY, mccalls culdoplasty;  Surgeon: Ranae Pila, MD;  Location: [facility];  Service: Gynecology;  Laterality: Bilateral;  . WISDOM TOOTH EXTRACTION      OB History    Gravida  3   Para  1   Term      Preterm  1   AB  2   Living  1     SAB      TAB      Ectopic  2   Multiple      Live Births               Home Medications    Prior to Admission medications   Medication Sig Start Date End Date Taking? Authorizing Provider  ARIPiprazole (ABILIFY) 5 MG tablet Take 1 tablet (5 mg total) by mouth daily. 10/29/17 10/29/18  Benjaman Pott, MD  gabapentin (NEURONTIN) 300 MG capsule Take 1 capsule (300 mg total) by mouth 3 (three) times daily. 10/12/18   Anson Fret, MD  HYDROcodone-acetaminophen (NORCO/VICODIN) 5-325 MG tablet Take 1 tablet by mouth every 6 (six) hours as needed for severe pain. 04/09/19   Georgetta Haber, NP  ibuprofen (ADVIL,MOTRIN) 800 MG tablet Take 1 tablet (800 mg total) by mouth every 8 (eight) hours as needed. 12/31/17   Ranae Pila, MD  levothyroxine (SYNTHROID, LEVOTHROID) 50 MCG tablet Take 50 mcg by mouth daily before breakfast.  09/10/13   [provider]  meloxicam (MOBIC) 7.5 MG tablet Take 1 tablet (7.5 mg total) by mouth daily. 04/09/19   Georgetta Haber, NP    Family History Family History  Problem Relation Age of Onset  . Thyroid disease Mother   . Nephrolithiasis Mother   . Urolithiasis Mother   . Heart attack Mother        signs  but neg eval  . Nephrolithiasis Father   . Urolithiasis Father   . Anxiety disorder Father   . Drug abuse Father   . Thyroid disease Sister   . Heart disease Maternal Grandmother     Social History Social History   Tobacco Use  . Smoking status: Never Smoker  . Smokeless tobacco: Never Used  Substance Use Topics  . Alcohol use: Yes    Comment: Rarely  . Drug use: No     Allergies   Compazine [prochlorperazine] and Oxycodone   Review of Systems Review of Systems   Physical Exam Triage Vital Signs ED Triage Vitals  Enc Vitals Group     BP 04/09/19 1641 121/87     Pulse Rate 04/09/19 1641 79     Resp 04/09/19 1641 16     Temp 04/09/19 1641 97.7 F (36.5 C)     Temp Source 04/09/19 1641 Oral     SpO2 04/09/19 1641 99 %     Weight --      Height --      Head Circumference --      Peak Flow --      Pain Score 04/09/19 1643 6     Pain Loc --      Pain Edu? --      Excl. in GC? --    No data found.  Updated Vital Signs BP 121/87 (BP Location: Left Arm)   Pulse 79   Temp 97.7 F (36.5 C) (Oral)   Resp 16   LMP  (LMP Unknown)   SpO2 99%   Visual Acuity Right Eye Distance:   Left Eye Distance:   Bilateral Distance:    Right Eye Near:   Left Eye Near:    Bilateral Near:     Physical Exam Constitutional:      General: She is not in acute distress.    Appearance: She is well-developed.  Cardiovascular:     Rate and Rhythm: Normal rate and regular rhythm.     Heart sounds: Normal heart sounds.  Pulmonary:     Effort: Pulmonary effort is normal.     Breath sounds: Normal breath sounds.  Abdominal:     General: Abdomen is flat.     Tenderness: There is no abdominal tenderness.  Genitourinary:    Pubic Area: No rash.      Labia:        Right: No rash or lesion.        Left: No rash or lesion.      Vagina: No vaginal discharge, erythema, tenderness, bleeding or lesions.  Musculoskeletal:     Right hip: She exhibits tenderness and bony tenderness.  She exhibits normal range of motion and normal strength.     Comments: Tenderness at proximal humerus on palpation; no pain with log roll; pain with abduction and with flexion; ambulatory without difficulty   Skin:    General: Skin is warm and dry.  Neurological:     Mental Status: She is alert and oriented to person, place, and time.      UC Treatments / Results  Labs (all labs ordered are listed, but only abnormal results are displayed) Labs Reviewed  URINE CULTURE  POCT URINALYSIS DIP (DEVICE)  CERVICOVAGINAL ANCILLARY ONLY    EKG None  Radiology Dg Hip Unilat With Pelvis 2-3 Views Right  Result Date: 04/09/2019 CLINICAL DATA:  Increasing right hip pain over 4 days EXAM: DG HIP (WITH OR WITHOUT PELVIS) 2-3V RIGHT COMPARISON:  None. FINDINGS: There is no evidence of hip fracture or dislocation. There is no evidence of arthropathy or other focal bone abnormality. IMPRESSION: Negative. Electronically Signed   By: Judie Petit.  Shick M.D.   On: 04/09/2019 17:54    Procedures Procedures (including critical care time)  Medications Ordered in UC Medications - No data to display  Initial Impression / Assessment and Plan / UC Course  I have reviewed the triage vital signs and the nursing notes.  Pertinent labs & imaging results that were available during my care of the patient were reviewed by me and considered in my medical decision making (see chart for details).    Vaginal cytology collected and pending. Patient declines HIV/RPR. Urine is normal today, elevated specific gravity- question if this is source of discomfort? Push fluids. Right hip films normal today. Bursitis vs tendonitis vs strain discussed. Nsaids recommended. Two tabs norco provided for at night. Light and regular activity as tolerated. If symptoms worsen or do not improve in the next week to return to be seen or to follow up with PCP.  Patient verbalized understanding and agreeable to plan.   Final Clinical  Impressions(s) / UC Diagnoses   Final diagnoses:  Hip pain  Screen for STD (sexually transmitted disease)     Discharge Instructions     Your urine is completely normal today.  Will notify you of any positive findings from you vaginal swab and if any changes to treatment are needed.   Will try daily meloxicam for your hip. Take daily. Take with food. Don't take additional ibuprofen or aleve.  Please follow up with your primary care provider and/or sports medicine for persistent hip pain.     ED Prescriptions    Medication Sig Dispense Auth. Provider   meloxicam (MOBIC) 7.5 MG tablet Take 1 tablet (7.5 mg total) by mouth daily. 20 tablet Linus Mako B, NP   HYDROcodone-acetaminophen (NORCO/VICODIN) 5-325 MG tablet Take 1 tablet by mouth every 6 (six) hours as needed for severe pain. 2 tablet Georgetta Haber, NP     Controlled Substance Prescriptions [place] Controlled Substance Registry consulted? Yes, I have consulted the [place] Controlled Substances Registry for this patient, and feel the risk/benefit ratio today is favorable for proceeding with this prescription for a controlled substance.   Georgetta Haber, NP 04/09/19 2102

## 2019-04-09 NOTE — ED Triage Notes (Signed)
Pt presents to [facility] for assessment of burning with urination x 1 week.  States she also "found some stuff out about my husband" and would like to be tested.  Pt also c/o right lower back and leg pain x 2 months.

## 2019-04-09 NOTE — Discharge Instructions (Signed)
Your urine is completely normal today.  Will notify you of any positive findings from you vaginal swab and if any changes to treatment are needed.   Will try daily meloxicam for your hip. Take daily. Take with food. Don't take additional ibuprofen or aleve.  Please follow up with your primary care provider and/or sports medicine for persistent hip pain.

## 2019-04-11 LAB — URINE CULTURE

## 2019-04-12 ENCOUNTER — Telehealth (HOSPITAL_COMMUNITY): Payer: Self-pay | Admitting: Emergency Medicine

## 2019-04-13 ENCOUNTER — Telehealth (HOSPITAL_COMMUNITY): Payer: Self-pay | Admitting: Emergency Medicine

## 2019-04-13 LAB — CERVICOVAGINAL ANCILLARY ONLY
Bacterial vaginitis: NEGATIVE
Candida vaginitis: POSITIVE — AB
Chlamydia: NEGATIVE
Neisseria Gonorrhea: NEGATIVE
Trichomonas: NEGATIVE

## 2019-04-13 MED ORDER — FLUCONAZOLE 150 MG PO TABS: 150.0000 mg | ORAL_TABLET | Freq: Once | ORAL | 0 refills | Status: AC

## 2019-04-13 NOTE — Telephone Encounter (Signed)
Test for candida (yeast) was positive.  Prescription for fluconazole 150mg po now, repeat dose in 3d if needed, #2 no refills, sent to the pharmacy of record.  Recheck or followup with PCP for further evaluation if symptoms are not improving.    Patient contacted and made aware of all results, all questions answered.   

## 2019-04-14 ENCOUNTER — Other Ambulatory Visit: Payer: Self-pay | Admitting: Neurology

## 2019-09-07 ENCOUNTER — Other Ambulatory Visit: Payer: Self-pay | Admitting: Neurology

## 2019-09-07 NOTE — Telephone Encounter (Signed)
Okay to wait till appt in 2 days to discuss neurontin Rx.

## 2019-09-07 NOTE — Telephone Encounter (Signed)
Last refills were sent 09/2018 to last through 01/2019. I called pt. She stated the Gabapentin 300 mg was given to her for headaches and she only takes them PRN. She has gone weeks without taking it. She would like a refill if possible until her 10/22 appt as she has run out. Last seen 09/2017. I told pt a physician would need to approve the refill, if unable to fill, will have to wait until her appt on Thursday. She verbalized appreciation.

## 2019-09-07 NOTE — Telephone Encounter (Signed)
Pt has called to r/s her annual f/u.  Pt has been scheduled with NP, Pt is asking if enough gabapentin (NEURONTIN) 300 MG capsule  Can be called into [facility] Until her appointment date 10-22.  Please call

## 2019-09-08 ENCOUNTER — Encounter: Payer: Self-pay | Admitting: *Deleted

## 2019-09-08 ENCOUNTER — Other Ambulatory Visit: Payer: Self-pay | Admitting: Neurology

## 2019-09-08 MED ORDER — GABAPENTIN 300 MG PO CAPS: 300.0000 mg | ORAL_CAPSULE | Freq: Three times a day (TID) | ORAL | 3 refills | Status: DC

## 2019-09-09 ENCOUNTER — Telehealth: Payer: Self-pay | Admitting: *Deleted

## 2019-09-09 ENCOUNTER — Ambulatory Visit: Payer: Self-pay | Admitting: Family Medicine

## 2019-09-09 NOTE — Telephone Encounter (Signed)
Patient no showed her f/u with Amy today. I received a verbal order from Dr. Jaynee Eagles to cancel the pt's refills of Gabapentin d/t appt being needed. I spoke with Lenna Sciara at [facility] and canceled the pt's refills of Gabapentin. The pt filled it yesterday.

## 2019-10-13 ENCOUNTER — Other Ambulatory Visit: Payer: Self-pay

## 2019-10-13 DIAGNOSIS — Z20822 Contact with and (suspected) exposure to covid-19: Secondary | ICD-10-CM

## 2019-10-15 LAB — NOVEL CORONAVIRUS, NAA: SARS-CoV-2, NAA: NOT DETECTED

## 2019-10-29 ENCOUNTER — Other Ambulatory Visit: Payer: Self-pay | Admitting: Neurology

## 2019-12-05 NOTE — Progress Notes (Addendum)
PATIENT: Erica Larson DOB: 1976-08-22  REASON FOR VISIT: follow up HISTORY FROM: patient  Chief Complaint  Patient presents with  . Follow-up    RM2. alone. Migraines have worsening w/o gabapentin. States that the gabapentin helps with the migraines.     HISTORY OF PRESENT ILLNESS: Today 12/06/19 Erica Larson is a 44 y.o. female here today for follow up for migraines and dysesthesias. She was started on gabapentin 319m three times daily. She reports significant improvement in headache frequency and intensity. She has been out of medication for about a month and has noted an increase in headache frequency. She does have intermittent numbness in hands. Gabapentin has helped significantly with this as well. She has regular follow up with PCP.   HISTORY: (copied from Dr ACathren Lainenote on 09/23/2017)  Interval history 09/23/2017: Migraines since childhood  But worsened in April, no inciting event, she stopped drinking softdrinks but then they improved. Last 3 months worsening. She has daily headaches. Some days they are worse than others. She has pounding on the right side of the head, pulsating, she gets a black line sometimes in the left eye but no consistent aura, behind the eye, she has light sensitivity when they are moderate to severe, nausea, occ she has to sit in a dark room, if sh emoves it makes it worse. She has had a migraine for 2 weeks, improved with the migraine cocktail, she had a reaction to compazine. Daily they are moderate in severity and they can get 8-9/10, 1-2 days a week can be severe, she has missed work, No medication overuse, No aura, ongoing since April. She does not take hydrocodone often. After breast reduction, discussed integrative therapies.   Medications tried: Gabapentin, Lamotrigine, wellbutrin, abilify, lexapro,   HZHY:QMVHQIM Hemricis a 44y.o.femalehere as a referral from Dr. FNorris Crossmedical history of obesity, hypothyroidism, polyarthralgia  and myalgia,chronic anxiety, osteoarthritis. Family history of lupus.Patient says she isher forevaluation of MS. She has numbness and tinlgingin the botom of the feet. She is off balance. Her joints are aching. She feels exhausted. Underneath her nose and across the nose shehada rash on her face like in Lupus.Symptoms started 6 weeks ago, she started getting more tired and her bones and joints started aching in her legs. Pain in the joints and up into the arms and in the anteior thighs. She feels like someone is stabbing the bottom of her heet in the morning. She has had the tinlgingin her feet off and on for the last several years without progression but all other symptoms are new. Also withMuscle achingandgeneralized weakness. She had an episode of slurred speech, not in the setting of a headache, she hadn;t felt good that day and all of a sudden her words wouldn;t come out, she went ot the [facility] urgent care and they said she had weakness on one side. Her mother has MS and sees Dr. WJannifer Franklinin our group.Patienthas vision changes in the right eye. She also has headaches. She was very sick for 5 months previous to symptoms, sinusproblems. No other focal neurologic deficits or complaints. Patient has headaches and these have not significantly changed, these ar not concerning to her she is more worried about her recent symptoms.  Reviewed notes, labs and imaging from outside physicians, which showed:  [facility] in both sides legs and arms, headaches, blurred vision and being tired for 3 days. She also reported blurring of vision,  dizziness, numbness and tingling in the left upper extremity, numbness and tingling in the right upper chin many, fatigue, lethargic, feeling of being drunk. Examination including head, eyes, ears, nose, throat, neck, cardiovascular, lungs, abdomen, skin, extremities and neurologic workup were  negative. Patient was sent to the emergency room.(I don't have records from the emergency room, will request.)  BUN 17, creatinine is 0.740 labs drawn 06/04/2016.  Review primary care notes.At last appointmentat primary careit appears patient had extensive testing ordered including ANA, CBC, CMP, ESR, folate, Lyme, lipid, rheumatoid factor, thyroid, B12, vitamin D, bilirubin. (I don't have results, we'll request, per patient results are normal)  Review previous notes in Epic. Previous history of overdosing on medications including clonazepam, Ambien and Requip at the end of 2016. She was admitted to the [place] [facility] behavioral unit for 2 days. She was diagnosed as possibly bipolar d/o.This is an unclear diagnosis. HerLatudawas later changed to Lexapro and Wellbutrin and she enrolled in counseling and recent diagnoses include depression and generalized anxiety and not bipolar disorder. Patient has been treated for mood disorder since 2014.   REVIEW OF SYSTEMS: Out of a complete 14 system review of symptoms, the patient complains only of the following symptoms, headaches, numbness and dizziness and all other reviewed systems are negative.   ALLERGIES: Allergies  Allergen Reactions  . Compazine [Prochlorperazine] Other (See Comments)    Legs jumping   . Oxycodone Itching    HOME MEDICATIONS: Outpatient Medications Prior to Visit  Medication Sig Dispense Refill  . ibuprofen (ADVIL,MOTRIN) 800 MG tablet Take 1 tablet (800 mg total) by mouth every 8 (eight) hours as needed. 30 tablet 0  . levothyroxine (SYNTHROID, LEVOTHROID) 50 MCG tablet Take 50 mcg by mouth daily before breakfast.     . meloxicam (MOBIC) 7.5 MG tablet Take 1 tablet (7.5 mg total) by mouth daily. 30 tablet 0  . rOPINIRole (REQUIP) 3 MG tablet Take 3 mg by mouth at bedtime.    . gabapentin (NEURONTIN) 300 MG capsule Take 1 capsule (300 mg total) by mouth 3 (three) times daily. 90 capsule 3   No  facility-administered medications prior to visit.    PAST MEDICAL HISTORY: Past Medical History:  Diagnosis Date  . Acne   . Amenorrhea   . Anxiety   . Bipolar disorder ([place])    type 2  . Depression   . Hypothyroidism   . Infertility, female    treatment with oligammenorhea  Dr Carren Rang   . Migraine   . Restless legs syndrome    currently no problems per patient  . SVD (spontaneous vaginal delivery)    x 1  . Thyroiditis     PAST SURGICAL HISTORY: Past Surgical History:  Procedure Laterality Date  . CYSTOSCOPY  12/29/2017   Procedure: CYSTOSCOPY;  Surgeon: Tyson Dense, MD;  Location: [facility];  Service: Gynecology;;  . DILATION AND CURETTAGE OF UTERUS    . LAPAROSCOPIC VAGINAL HYSTERECTOMY WITH SALPINGECTOMY Bilateral 12/29/2017   Procedure: LAPAROSCOPIC ASSISTED VAGINAL HYSTERECTOMY WITH SALPINGECTOMY, mccalls culdoplasty;  Surgeon: Tyson Dense, MD;  Location: [facility];  Service: Gynecology;  Laterality: Bilateral;  . WISDOM TOOTH EXTRACTION      FAMILY HISTORY: Family History  Problem Relation Age of Onset  . Thyroid disease Mother   . Nephrolithiasis Mother   . Urolithiasis Mother   . Heart attack Mother        signs but neg eval  . Nephrolithiasis Father   . Urolithiasis Father   .  Anxiety disorder Father   . Drug abuse Father   . Thyroid disease Sister   . Heart disease Maternal Grandmother     SOCIAL HISTORY: Social History   Socioeconomic History  . Marital status: Married    Spouse name: [name]  . Number of children: 1  . Years of education: Some college  . Highest education level: Not on file  Occupational History  . Occupation: Engineer, maintenance  . Occupation: Private sitting  Tobacco Use  . Smoking status: Never Smoker  . Smokeless tobacco: Never Used  Substance and Sexual Activity  . Alcohol use: Yes    Comment: Rarely  . Drug use: No  . Sexual activity: Yes    Birth control/protection: None  Other Topics Concern  . Not on  file  Social History Narrative   Lives with husband   hhof 3 married non smoker     Daughter husband and pet dog   Caffeine use: Diet coke   Social Determinants of Health   Financial Resource Strain:   . Difficulty of Paying Living Expenses: Not on file  Food Insecurity:   . Worried About Charity fundraiser in the Last Year: Not on file  . Ran Out of Food in the Last Year: Not on file  Transportation Needs:   . Lack of Transportation (Medical): Not on file  . Lack of Transportation (Non-Medical): Not on file  Physical Activity:   . Days of Exercise per Week: Not on file  . Minutes of Exercise per Session: Not on file  Stress:   . Feeling of Stress : Not on file  Social Connections:   . Frequency of Communication with Friends and Family: Not on file  . Frequency of Social Gatherings with Friends and Family: Not on file  . Attends Religious Services: Not on file  . Active Member of Clubs or Organizations: Not on file  . Attends Archivist Meetings: Not on file  . Marital Status: Not on file  Intimate Partner Violence:   . Fear of Current or Ex-Partner: Not on file  . Emotionally Abused: Not on file  . Physically Abused: Not on file  . Sexually Abused: Not on file      PHYSICAL EXAM  Vitals:   12/06/19 0957  BP: 123/72  Pulse: 85  Temp: 97.8 F (36.6 C)  Weight: 158 lb (71.7 kg)  Height: 5' 6"  (1.676 m)   Body mass index is 25.5 kg/m.  Generalized: Well developed, in no acute distress  Cardiology: normal rate and rhythm, no murmur noted Respiratory: clear to auscultation bilaterally  Neurological examination  Mentation: Alert oriented to time, place, history taking. Follows all commands speech and language fluent Cranial nerve II-XII: Pupils were equal round reactive to light. Extraocular movements were full, visual field were full  Motor: The motor testing reveals 5 over 5 strength of all 4 extremities. Good symmetric motor tone is noted throughout.    Sensory: Sensory testing is intact to soft touch on all 4 extremities. No evidence of extinction is noted.  Coordination: Cerebellar testing reveals good finger-nose-finger and heel-to-shin bilaterally.  Gait and station: Gait is normal.  DIAGNOSTIC DATA (LABS, IMAGING, TESTING) - I reviewed patient records, labs, notes, testing and imaging myself where available.  No flowsheet data found.   Lab Results  Component Value Date   WBC 8.0 12/31/2017   HGB 11.1 (L) 12/31/2017   HCT 33.4 (L) 12/31/2017   MCV 90.0 12/31/2017   PLT 302  12/31/2017      Component Value Date/Time   NA 137 11/03/2015 2358   K 3.6 11/03/2015 2358   CL 106 11/03/2015 2358   CO2 25 11/03/2015 2358   GLUCOSE 115 (H) 11/03/2015 2358   BUN 18 11/03/2015 2358   CREATININE 0.73 11/03/2015 2358   CALCIUM 10.9 (H) 11/03/2015 2358   CALCIUM 11.2 (H) 01/03/2009 2254   PROT 8.4 (H) 11/03/2015 2358   ALBUMIN 4.6 11/03/2015 2358   AST 33 11/03/2015 2358   ALT 39 11/03/2015 2358   ALKPHOS 71 11/03/2015 2358   BILITOT 0.5 11/03/2015 2358   GFRNONAA >60 11/03/2015 2358   GFRAA >60 11/03/2015 2358   No results found for: CHOL, HDL, LDLCALC, LDLDIRECT, TRIG, CHOLHDL No results found for: HGBA1C Lab Results  Component Value Date   VITAMINB12 333 01/05/2009   Lab Results  Component Value Date   TSH 3.61 01/02/2011       ASSESSMENT AND PLAN 44 y.o. year old female  has a past medical history of Acne, Amenorrhea, Anxiety, Bipolar disorder ([place]), Depression, Hypothyroidism, Infertility, female, Migraine, Restless legs syndrome, SVD (spontaneous vaginal delivery), and Thyroiditis. here with     ICD-10-CM   1. Chronic migraine without aura without status migrainosus, not intractable  G43.709     Erica Larson reports that she was doing very well on gabapentin 335m three times daily. Headaches were well managed and dysesthesias improved. Unfortunately, she was not seen for follow up and has been without medication for  about 1 month. Headaches have returned but are not near as severe. We will restart gabapentin 302mthree times daily. She was educated on need for regular follow up. As headaches are stable on current regimen, she may return to PCP for refills. If PCP unable to refill, she will need to continue yearly follow up with [facility]. Adequate hydration, well balanced diet and regular exercise advised. She verbalizes understanding and agreement with this plan.    No orders of the defined types were placed in this encounter.    Meds ordered this encounter  Medications  . gabapentin (NEURONTIN) 300 MG capsule    Sig: Take 1 capsule (300 mg total) by mouth 3 (three) times daily.    Dispense:  270 capsule    Refill:  3    Order Specific Question:   Supervising Provider    Answer:   AHMelvenia [id number]    I spent 15 minutes with the patient. 50% of this time was spent counseling and educating patient on plan of care and medications.    AmDebbora PrestoFNP-C 12/06/2019, 10:19 AM [facility] [id number]Su[place]ered[place] [phone] any corrections needed, and agree with history, physical, neuro exam,assessment and plan as stated.     AnSarina IllMD [facility]

## 2019-12-06 ENCOUNTER — Other Ambulatory Visit: Payer: Self-pay

## 2019-12-06 ENCOUNTER — Ambulatory Visit (INDEPENDENT_AMBULATORY_CARE_PROVIDER_SITE_OTHER): Payer: BLUE CROSS/BLUE SHIELD | Admitting: Family Medicine

## 2019-12-06 ENCOUNTER — Encounter: Payer: Self-pay | Admitting: Family Medicine

## 2019-12-06 ENCOUNTER — Ambulatory Visit
Admission: EM | Admit: 2019-12-06 | Discharge: 2019-12-06 | Disposition: A | Discharge status: Home / Self Care | Payer: BLUE CROSS/BLUE SHIELD

## 2019-12-06 ENCOUNTER — Ambulatory Visit (INDEPENDENT_AMBULATORY_CARE_PROVIDER_SITE_OTHER): Payer: BLUE CROSS/BLUE SHIELD

## 2019-12-06 VITALS — BP 123/72 | HR 85 | Temp 97.8°F | Ht 66.0 in | Wt 158.0 lb

## 2019-12-06 DIAGNOSIS — S93402A Sprain of unspecified ligament of left ankle, initial encounter: Secondary | ICD-10-CM

## 2019-12-06 DIAGNOSIS — G43709 Chronic migraine without aura, not intractable, without status migrainosus: Secondary | ICD-10-CM

## 2019-12-06 DIAGNOSIS — S99912A Unspecified injury of left ankle, initial encounter: Secondary | ICD-10-CM

## 2019-12-06 DIAGNOSIS — M25572 Pain in left ankle and joints of left foot: Secondary | ICD-10-CM

## 2019-12-06 MED ORDER — GABAPENTIN 300 MG PO CAPS: 300.0000 mg | ORAL_CAPSULE | Freq: Three times a day (TID) | ORAL | 3 refills | Status: AC

## 2019-12-06 MED ORDER — MELOXICAM 7.5 MG PO TABS: 7.5000 mg | ORAL_TABLET | Freq: Every day | ORAL | 0 refills | Status: DC

## 2019-12-06 NOTE — Discharge Instructions (Signed)
X-rays negative for fracture or dislocation Continue conservative management of rest, ice, and elevation Ankle brace given in office Will use crutches at home as needed for comfort; progress activities as tolerated (non-weight-bearing with cruthces, weight-bearing with crutches, weight-bearing without crutches) Take mobic as needed for pain relief (may cause abdominal discomfort, ulcers, and GI bleeds avoid taking with other NSAIDs) Follow up with PCP or orthopedist as needed Return or go to the ER if you have any new or worsening symptoms (fever, chills, chest pain, swelling, redness, bruising, worsening symptoms despite medications, etc...)

## 2019-12-06 NOTE — ED Provider Notes (Signed)
[facility]   [id number] 12/06/19 Arrival Time: 0801  CC: Left ankle pain  SUBJECTIVE: History from: patient. Erica Larson is a 44 y.o. female complains of LT ankle pain/ injury that began 1 day ago.  Twisted ankle taking a step off of porch.  Localizes the pain to the inside ankle.  Describes the pain as intermittent and achy in character.  Has tried OTC medications with minimal relief.  Symptoms are made worse with weight-bearing.  Denies similar symptoms in the past.  Complains of associated swelling and ecchymosis.  Denies fever, chills, erythema, weakness, numbness and tingling.  ROS: As per HPI.  All other pertinent ROS negative.     Past Medical History:  Diagnosis Date  . Acne   . Amenorrhea   . Anxiety   . Bipolar disorder ([facility])    type 2  . Depression   . Hypothyroidism   . Infertility, female    treatment with oligammenorhea  Dr Chevis Pretty   . Migraine   . Restless legs syndrome    currently no problems per patient  . SVD (spontaneous vaginal delivery)    x 1  . Thyroiditis    Past Surgical History:  Procedure Laterality Date  . CYSTOSCOPY  12/29/2017   Procedure: CYSTOSCOPY;  Surgeon: Ranae Pila, MD;  Location: [facility];  Service: Gynecology;;  . DILATION AND CURETTAGE OF UTERUS    . LAPAROSCOPIC VAGINAL HYSTERECTOMY WITH SALPINGECTOMY Bilateral 12/29/2017   Procedure: LAPAROSCOPIC ASSISTED VAGINAL HYSTERECTOMY WITH SALPINGECTOMY, mccalls culdoplasty;  Surgeon: Ranae Pila, MD;  Location: [facility];  Service: Gynecology;  Laterality: Bilateral;  . WISDOM TOOTH EXTRACTION     Allergies  Allergen Reactions  . Compazine [Prochlorperazine] Other (See Comments)    Legs jumping   . Oxycodone Itching   No current facility-administered medications on file prior to encounter.   Current Outpatient Medications on File Prior to Encounter  Medication Sig Dispense Refill  . rOPINIRole (REQUIP) 3 MG tablet Take 3 mg by mouth at bedtime.    .  gabapentin (NEURONTIN) 300 MG capsule Take 1 capsule (300 mg total) by mouth 3 (three) times daily. 90 capsule 3  . ibuprofen (ADVIL,MOTRIN) 800 MG tablet Take 1 tablet (800 mg total) by mouth every 8 (eight) hours as needed. 30 tablet 0  . levothyroxine (SYNTHROID, LEVOTHROID) 50 MCG tablet Take 50 mcg by mouth daily before breakfast.     . [DISCONTINUED] ARIPiprazole (ABILIFY) 5 MG tablet Take 1 tablet (5 mg total) by mouth daily. 30 tablet 2   Social History   Socioeconomic History  . Marital status: Married    Spouse name: Elige Radon  . Number of children: 1  . Years of education: Some college  . Highest education level: Not on file  Occupational History  . Occupation: Network engineer  . Occupation: Private sitting  Tobacco Use  . Smoking status: Never Smoker  . Smokeless tobacco: Never Used  Substance and Sexual Activity  . Alcohol use: Yes    Comment: Rarely  . Drug use: No  . Sexual activity: Yes    Birth control/protection: None  Other Topics Concern  . Not on file  Social History Narrative   Lives with husband   hhof 3 married non smoker     Daughter husband and pet dog   Caffeine use: Diet coke   Social Determinants of Health   Financial Resource Strain:   . Difficulty of Paying Living Expenses: Not on file  Food Insecurity:   .  Worried About Programme researcher, broadcasting/film/video in the Last Year: Not on file  . Ran Out of Food in the Last Year: Not on file  Transportation Needs:   . Lack of Transportation (Medical): Not on file  . Lack of Transportation (Non-Medical): Not on file  Physical Activity:   . Days of Exercise per Week: Not on file  . Minutes of Exercise per Session: Not on file  Stress:   . Feeling of Stress : Not on file  Social Connections:   . Frequency of Communication with Friends and Family: Not on file  . Frequency of Social Gatherings with Friends and Family: Not on file  . Attends Religious Services: Not on file  . Active Member of Clubs or Organizations:  Not on file  . Attends Banker Meetings: Not on file  . Marital Status: Not on file  Intimate Partner Violence:   . Fear of Current or Ex-Partner: Not on file  . Emotionally Abused: Not on file  . Physically Abused: Not on file  . Sexually Abused: Not on file   Family History  Problem Relation Age of Onset  . Thyroid disease Mother   . Nephrolithiasis Mother   . Urolithiasis Mother   . Heart attack Mother        signs but neg eval  . Nephrolithiasis Father   . Urolithiasis Father   . Anxiety disorder Father   . Drug abuse Father   . Thyroid disease Sister   . Heart disease Maternal Grandmother     OBJECTIVE:  Vitals:   12/06/19 0811  BP: 112/79  Pulse: 83  Resp: 18  Temp: 98 F (36.7 C)  TempSrc: Oral  SpO2: 98%    General appearance: ALERT; in no acute distress.  Head: NCAT Lungs: Normal respiratory effort CV: Dorsalis pedis pulses 2+. Cap refill < 2 seconds Musculoskeletal: LT ankle Inspection: Swelling over lateral ankle Palpation: TTP over medial and lateral mallolus ROM: LROM about the ankle Strength: 5/5 dorsiflexion, 5/5 plantar flexion Skin: warm and dry Neurologic: Ambulates with antalgic gait; Sensation intact about the lower extremities Psychological: alert and cooperative; normal mood and affect  DIAGNOSTIC STUDIES:  DG Ankle Complete Left  Result Date: 12/06/2019 CLINICAL DATA:  Twisted left ankle yesterday. Pain and bruising. EXAM: LEFT ANKLE COMPLETE - 3+ VIEW COMPARISON:  None. FINDINGS: Soft tissue swelling is noted about the lateral and anterior aspects of the ankle. No fracture or dislocation is identified. Joint space widths are preserved. IMPRESSION: Soft tissue swelling without acute osseous abnormality. Electronically Signed   By: Sebastian Ache M.D.   On: 12/06/2019 08:28    X-rays negative for bony abnormalities including fracture, or dislocation.  Soft tissue swelling.    I have reviewed the x-rays myself and the  radiologist interpretation. I am in agreement with the radiologist interpretation.     ASSESSMENT & PLAN:  1. Sprain of left ankle, unspecified ligament, initial encounter   2. Injury of left ankle, initial encounter     Meds ordered this encounter  Medications  . meloxicam (MOBIC) 7.5 MG tablet    Sig: Take 1 tablet (7.5 mg total) by mouth daily.    Dispense:  30 tablet    Refill:  0    Order Specific Question:   Supervising Provider    Answer:   Eustace Moore [[id number]]   X-rays negative for fracture or dislocation Continue conservative management of rest, ice, and elevation Ankle brace given in office Will use  crutches at home as needed for comfort; progress activities as tolerated (non-weight-bearing with cruthces, weight-bearing with crutches, weight-bearing without crutches) Take mobic as needed for pain relief (may cause abdominal discomfort, ulcers, and GI bleeds avoid taking with other NSAIDs) Follow up with PCP or orthopedist as needed Return or go to the ER if you have any new or worsening symptoms (fever, chills, chest pain, swelling, redness, bruising, worsening symptoms despite medications, etc...)   Reviewed expectations re: course of current medical issues. Questions answered. Outlined signs and symptoms indicating need for more acute intervention. Patient verbalized understanding. After Visit Summary given.    Lestine Box, PA-C 12/06/19 [phone]

## 2019-12-06 NOTE — ED Triage Notes (Signed)
Pt presents to UC w/ c/o twisting left ankle yesterday afternoon. Pt was stepping off porch stairs on last step and twisted ankle. Pt cannot fully bear weight on left foot and is limping to room. Left ankle is swollen and slightly bruised.

## 2019-12-06 NOTE — Patient Instructions (Signed)
We will resume gabapentin 300mg  three times daily  Adequate hydration, healthy diet and regular exercise advised   Return to PCP if willing to refill medications, follow up with me in 1 year if not.    Migraine Headache A migraine headache is a very strong throbbing pain on one side or both sides of your head. This type of headache can also cause other symptoms. It can last from 4 hours to 3 days. Talk with your doctor about what things may bring on (trigger) this condition. What are the causes? The exact cause of this condition is not known. This condition may be triggered or caused by:  Drinking alcohol.  Smoking.  Taking medicines, such as: ? Medicine used to treat chest pain (nitroglycerin). ? Birth control pills. ? Estrogen. ? Some blood pressure medicines.  Eating or drinking certain products.  Doing physical activity. Other things that may trigger a migraine headache include:  Having a menstrual period.  Pregnancy.  Hunger.  Stress.  Not getting enough sleep or getting too much sleep.  Weather changes.  Tiredness (fatigue). What increases the risk?  Being 89-25 years old.  Being female.  Having a family history of migraine headaches.  Being Caucasian.  Having depression or anxiety.  Being very overweight. What are the signs or symptoms?  A throbbing pain. This pain may: ? Happen in any area of the head, such as on one side or both sides. ? Make it hard to do daily activities. ? Get worse with physical activity. ? Get worse around bright lights or loud noises.  Other symptoms may include: ? Feeling sick to your stomach (nauseous). ? Vomiting. ? Dizziness. ? Being sensitive to bright lights, loud noises, or smells.  Before you get a migraine headache, you may get warning signs (an aura). An aura may include: ? Seeing flashing lights or having blind spots. ? Seeing bright spots, halos, or zigzag lines. ? Having tunnel vision or blurred  vision. ? Having numbness or a tingling feeling. ? Having trouble talking. ? Having weak muscles.  Some people have symptoms after a migraine headache (postdromal phase), such as: ? Tiredness. ? Trouble thinking (concentrating). How is this treated?  Taking medicines that: ? Relieve pain. ? Relieve the feeling of being sick to your stomach. ? Prevent migraine headaches.  Treatment may also include: ? Having acupuncture. ? Avoiding foods that bring on migraine headaches. ? Learning ways to control your body functions (biofeedback). ? Therapy to help you know and deal with negative thoughts (cognitive behavioral therapy). Follow these instructions at home: Medicines  Take over-the-counter and prescription medicines only as told by your doctor.  Ask your doctor if the medicine prescribed to you: ? Requires you to avoid driving or using heavy machinery. ? Can cause trouble pooping (constipation). You may need to take these steps to prevent or treat trouble pooping:  Drink enough fluid to keep your pee (urine) pale yellow.  Take over-the-counter or prescription medicines.  Eat foods that are high in fiber. These include beans, whole grains, and fresh fruits and vegetables.  Limit foods that are high in fat and sugar. These include fried or sweet foods. Lifestyle  Do not drink alcohol.  Do not use any products that contain nicotine or tobacco, such as cigarettes, e-cigarettes, and chewing tobacco. If you need help quitting, ask your doctor.  Get at least 8 hours of sleep every night.  Limit and deal with stress. General instructions      Keep  a journal to find out what may bring on your migraine headaches. For example, write down: ? What you eat and drink. ? How much sleep you get. ? Any change in what you eat or drink. ? Any change in your medicines.  If you have a migraine headache: ? Avoid things that make your symptoms worse, such as bright lights. ? It may  help to lie down in a dark, quiet room. ? Do not drive or use heavy machinery. ? Ask your doctor what activities are safe for you.  Keep all follow-up visits as told by your doctor. This is important. Contact a doctor if:  You get a migraine headache that is different or worse than others you have had.  You have more than 15 headache days in one month. Get help right away if:  Your migraine headache gets very bad.  Your migraine headache lasts longer than 72 hours.  You have a fever.  You have a stiff neck.  You have trouble seeing.  Your muscles feel weak or like you cannot control them.  You start to lose your balance a lot.  You start to have trouble walking.  You pass out (faint).  You have a seizure. Summary  A migraine headache is a very strong throbbing pain on one side or both sides of your head. These headaches can also cause other symptoms.  This condition may be treated with medicines and changes to your lifestyle.  Keep a journal to find out what may bring on your migraine headaches.  Contact a doctor if you get a migraine headache that is different or worse than others you have had.  Contact your doctor if you have more than 15 headache days in a month. This information is not intended to replace advice given to you by your health care provider. Make sure you discuss any questions you have with your health care provider. Document Revised: 02/26/2019 Document Reviewed: 12/17/2018 Elsevier Patient Education  [place].

## 2019-12-20 ENCOUNTER — Ambulatory Visit: Payer: BLUE CROSS/BLUE SHIELD

## 2019-12-20 ENCOUNTER — Ambulatory Visit (INDEPENDENT_AMBULATORY_CARE_PROVIDER_SITE_OTHER): Payer: BLUE CROSS/BLUE SHIELD

## 2019-12-20 ENCOUNTER — Other Ambulatory Visit: Payer: Self-pay

## 2019-12-20 ENCOUNTER — Ambulatory Visit
Admission: EM | Admit: 2019-12-20 | Discharge: 2019-12-20 | Disposition: A | Discharge status: Home / Self Care | Payer: BLUE CROSS/BLUE SHIELD | Attending: Emergency Medicine | Admitting: Emergency Medicine

## 2019-12-20 DIAGNOSIS — M79661 Pain in right lower leg: Secondary | ICD-10-CM | POA: Diagnosis not present

## 2019-12-20 DIAGNOSIS — M79604 Pain in right leg: Secondary | ICD-10-CM | POA: Diagnosis not present

## 2019-12-20 DIAGNOSIS — W19XXXA Unspecified fall, initial encounter: Secondary | ICD-10-CM

## 2019-12-20 MED ORDER — TRAMADOL HCL 50 MG PO TABS: 50.0000 mg | ORAL_TABLET | Freq: Two times a day (BID) | ORAL | 0 refills | Status: DC | PRN

## 2019-12-20 MED ORDER — ACETAMINOPHEN 325 MG PO TABS
975.0000 mg | ORAL_TABLET | Freq: Once | ORAL | Status: AC
  Administered 2019-12-20: 975 mg via ORAL

## 2019-12-20 NOTE — ED Provider Notes (Signed)
[facility]   [id number] 12/20/19 Arrival Time: 0806  CC: RT leg injury  SUBJECTIVE: History from: patient. Erica Larson is a 44 y.o. female complains of RT leg injury x 5 days ago.  Walking down steps with crutches, lost balanced and fell down 3 steps.  Localizes the pain to the RLE.  Describes the pain as constant and sharp/ achy/ throbbing in character.  Has tried icing, ibuprofen, and elevation without relief.  Symptoms are made worse to the touch and weight-bearing.  Denies similar symptoms in the past.  Complains of associated swelling, ecchymosis, and weakness.  Denies fever, chills, erythema, numbness and tingling.     ROS: As per HPI.  All other pertinent ROS negative.     Past Medical History:  Diagnosis Date  . Acne   . Amenorrhea   . Anxiety   . Bipolar disorder ([place])    type 2  . Depression   . Hypothyroidism   . Infertility, female    treatment with oligammenorhea  Dr Carren Rang   . Migraine   . Restless legs syndrome    currently no problems per patient  . SVD (spontaneous vaginal delivery)    x 1  . Thyroiditis    Past Surgical History:  Procedure Laterality Date  . CYSTOSCOPY  12/29/2017   Procedure: CYSTOSCOPY;  Surgeon: Tyson Dense, MD;  Location: [facility];  Service: Gynecology;;  . DILATION AND CURETTAGE OF UTERUS    . LAPAROSCOPIC VAGINAL HYSTERECTOMY WITH SALPINGECTOMY Bilateral 12/29/2017   Procedure: LAPAROSCOPIC ASSISTED VAGINAL HYSTERECTOMY WITH SALPINGECTOMY, mccalls culdoplasty;  Surgeon: Tyson Dense, MD;  Location: [facility];  Service: Gynecology;  Laterality: Bilateral;  . WISDOM TOOTH EXTRACTION     Allergies  Allergen Reactions  . Compazine [Prochlorperazine] Other (See Comments)    Legs jumping   . Oxycodone Itching   No current facility-administered medications on file prior to encounter.   Current Outpatient Medications on File Prior to Encounter  Medication Sig Dispense Refill  . gabapentin (NEURONTIN) 300 MG  capsule Take 1 capsule (300 mg total) by mouth 3 (three) times daily. 270 capsule 3  . ibuprofen (ADVIL,MOTRIN) 800 MG tablet Take 1 tablet (800 mg total) by mouth every 8 (eight) hours as needed. 30 tablet 0  . levothyroxine (SYNTHROID, LEVOTHROID) 50 MCG tablet Take 50 mcg by mouth daily before breakfast.     . meloxicam (MOBIC) 7.5 MG tablet Take 1 tablet (7.5 mg total) by mouth daily. 30 tablet 0  . rOPINIRole (REQUIP) 3 MG tablet Take 3 mg by mouth at bedtime.    . [DISCONTINUED] ARIPiprazole (ABILIFY) 5 MG tablet Take 1 tablet (5 mg total) by mouth daily. 30 tablet 2   Social History   Socioeconomic History  . Marital status: Married    Spouse name: [name]  . Number of children: 1  . Years of education: Some college  . Highest education level: Not on file  Occupational History  . Occupation: Engineer, maintenance  . Occupation: Private sitting  Tobacco Use  . Smoking status: Never Smoker  . Smokeless tobacco: Never Used  Substance and Sexual Activity  . Alcohol use: Yes    Comment: Rarely  . Drug use: No  . Sexual activity: Yes    Birth control/protection: None  Other Topics Concern  . Not on file  Social History Narrative   Lives with husband   hhof 3 married non smoker     Daughter husband and pet dog   Caffeine  use: Diet coke   Social Determinants of Health   Financial Resource Strain:   . Difficulty of Paying Living Expenses: Not on file  Food Insecurity:   . Worried About Programme researcher, broadcasting/film/video in the Last Year: Not on file  . Ran Out of Food in the Last Year: Not on file  Transportation Needs:   . Lack of Transportation (Medical): Not on file  . Lack of Transportation (Non-Medical): Not on file  Physical Activity:   . Days of Exercise per Week: Not on file  . Minutes of Exercise per Session: Not on file  Stress:   . Feeling of Stress : Not on file  Social Connections:   . Frequency of Communication with Friends and Family: Not on file  . Frequency of Social  Gatherings with Friends and Family: Not on file  . Attends Religious Services: Not on file  . Active Member of Clubs or Organizations: Not on file  . Attends Banker Meetings: Not on file  . Marital Status: Not on file  Intimate Partner Violence:   . Fear of Current or Ex-Partner: Not on file  . Emotionally Abused: Not on file  . Physically Abused: Not on file  . Sexually Abused: Not on file   Family History  Problem Relation Age of Onset  . Thyroid disease Mother   . Nephrolithiasis Mother   . Urolithiasis Mother   . Heart attack Mother        signs but neg eval  . Nephrolithiasis Father   . Urolithiasis Father   . Anxiety disorder Father   . Drug abuse Father   . Thyroid disease Sister   . Heart disease Maternal Grandmother     OBJECTIVE:  Vitals:   12/20/19 0817  BP: 116/86  Pulse: 95  Resp: 18  Temp: 98.3 F (36.8 C)  TempSrc: Oral  SpO2: 98%    General appearance: ALERT; in no acute distress.  Head: NCAT Lungs: Normal respiratory effort CV: RRR Musculoskeletal: RLE Inspection: Swelling and ecchymosis to medial distal thigh, knee, and tib/fib Palpation: Diffusely TTP over RT knee and proximal tib/fib ROM: LROM about the  Strength: 4/5 hip flexion, 4/5 knee flexion, 4/5 knee extension Skin: warm and dry Neurologic: Antalgic gait; Sensation intact about the lower extremities Psychological: alert and cooperative; normal mood and affect  DIAGNOSTIC STUDIES:  DG Tibia/Fibula Right  Result Date: 12/20/2019 CLINICAL DATA:  Fall.  Pain and bruising. EXAM: RIGHT TIBIA AND FIBULA - 2 VIEW COMPARISON:  None. FINDINGS: There is no evidence of fracture or other focal bone lesions. Soft tissues are unremarkable. IMPRESSION: Negative. Electronically Signed   By: Signa Kell M.D.   On: 12/20/2019 08:51    X-rays negative for bony abnormalities including fracture, or dislocation.    I have reviewed the x-rays myself and the radiologist interpretation. I am  in agreement with the radiologist interpretation.     ASSESSMENT & PLAN:  1. Pain of right lower extremity due to injury   2. Fall, initial encounter     Meds ordered this encounter  Medications  . acetaminophen (TYLENOL) tablet 975 mg  . traMADol (ULTRAM) 50 MG tablet    Sig: Take 1 tablet (50 mg total) by mouth every 12 (twelve) hours as needed for severe pain.    Dispense:  10 tablet    Refill:  0    Order Specific Question:   Supervising Provider    Answer:   Eustace Moore [[id number]]  Tylenol given in office X-rays negative for fracture Continue with crutches Declines straight-leg immobilizer today Continue conservative management of rest, ice, and elevation Continue with ibuprofen and tylenol Tramadol for severe break-through pain.  DO NOT TAKE PRIOR TO driving or operating heavy machinery  Follow up with orthopedist for further evaluation and management Return or go to the ER if you have any new or worsening symptoms (fever, chills, chest pain, increased swelling, changes in skin color, worsening pain despite medication, etc...)    Reviewed expectations re: course of current medical issues. Questions answered. Outlined signs and symptoms indicating need for more acute intervention. Patient verbalized understanding. After Visit Summary given.    Rennis Harding, PA-C 12/20/19 [phone]

## 2019-12-20 NOTE — Discharge Instructions (Addendum)
Tylenol given in office X-rays negative for fracture Continue with crutches Declines straight-leg immobilizer today Continue conservative management of rest, ice, and elevation Continue with ibuprofen and tylenol Tramadol for severe break-through pain.  DO NOT TAKE PRIOR TO driving or operating heavy machinery  Follow up with orthopedist for further evaluation and management Return or go to the ER if you have any new or worsening symptoms (fever, chills, chest pain, increased swelling, changes in skin color, worsening pain despite medication, etc...)

## 2019-12-20 NOTE — ED Triage Notes (Signed)
Pt presents to UC w/ c/o fall 5 days ago. Pt states she was using her crutches (from an ankle injury 2 weeks ago) to walk down the stairs, lost her balance, and fell down 3 steps. Pt does not recall how her leg twisted. Pt's left lower leg is bruised and slightly swollen. ROM present.

## 2019-12-21 ENCOUNTER — Ambulatory Visit: Payer: BLUE CROSS/BLUE SHIELD | Admitting: Orthopaedic Surgery

## 2020-01-25 ENCOUNTER — Ambulatory Visit
Admission: EM | Admit: 2020-01-25 | Discharge: 2020-01-25 | Disposition: A | Discharge status: Home / Self Care | Payer: BLUE CROSS/BLUE SHIELD | Attending: Internal Medicine | Admitting: Internal Medicine

## 2020-01-25 ENCOUNTER — Other Ambulatory Visit: Payer: Self-pay

## 2020-01-25 ENCOUNTER — Encounter: Payer: Self-pay | Admitting: Emergency Medicine

## 2020-01-25 DIAGNOSIS — R109 Unspecified abdominal pain: Secondary | ICD-10-CM | POA: Diagnosis not present

## 2020-01-25 LAB — POCT URINALYSIS DIP (MANUAL ENTRY)
Bilirubin, UA: NEGATIVE
Blood, UA: NEGATIVE
Glucose, UA: NEGATIVE mg/dL
Ketones, POC UA: NEGATIVE mg/dL
Leukocytes, UA: NEGATIVE
Nitrite, UA: NEGATIVE
Protein Ur, POC: NEGATIVE mg/dL
Spec Grav, UA: >=1.03 — NL (ref 1.010–1.025)
Urobilinogen, UA: 0.2 E.U./dL
pH, UA: 5.5 (ref 5.0–8.0)

## 2020-01-25 MED ORDER — ONDANSETRON HCL 4 MG PO TABS: 4.0000 mg | ORAL_TABLET | Freq: Three times a day (TID) | ORAL | 0 refills | Status: DC | PRN

## 2020-01-25 NOTE — Discharge Instructions (Addendum)
Get rest and drink fluids °Zofran prescribed.  Take as directed.   ° °DIET Instructions:  °30 minutes after taking nausea medicine, begin with sips of clear liquids. If able to hold down 2 - 4 ounces for 30 minutes, begin drinking more. °Increase your fluid intake to replace losses. °Clear liquids only for 24 hours (water, tea, sport drinks, clear flat ginger ale or cola and juices, broth, jello, popsicles, ect). °Advance to bland foods, applesauce, rice, baked or boiled chicken, ect. °Avoid milk, greasy foods and anything that doesn’t agree with you. ° °If you experience new or worsening symptoms return or go to ER such as fever, chills, nausea, vomiting, diarrhea, bloody or dark tarry stools, constipation, urinary symptoms, worsening abdominal discomfort, symptoms that do not improve with medications, inability to keep fluids down, etc... ° °Reviewed expectations re: course of current medical issues. Questions answered. °Outlined signs and symptoms indicating need for more acute intervention. °Patient verbalized understanding. °After Visit Summary given. °

## 2020-01-25 NOTE — ED Triage Notes (Signed)
Pt here for bilateral flank pain with some nausea  3 days

## 2020-01-25 NOTE — ED Provider Notes (Signed)
[facility]    CSN: [id number] Arrival date & time: 01/25/20  1628      History   Chief Complaint Chief Complaint  Patient presents with  . Flank Pain    HPI Erica Larson is a 44 y.o. female.   Who presented to the urgent care for complaint of care flank pain and nausea for the past 3 days.  Denies a precipitating event, or specific injury.   Reported emesis as of a food color.  Patient localizes pain to to her bilateral flank.  Denies blood in her stool or vomit describes it as intermittent and achy in character.  Has tried OTC medications without relief.  Nothing made her symptoms worse.  Denies similar symptoms in the past.  Denies fever, chills, appetite change, weight change, chest pain,  changes in bowel or bladder habits.   The history is provided by the patient. No language interpreter was used.  Flank Pain    Past Medical History:  Diagnosis Date  . Acne   . Amenorrhea   . Anxiety   . Bipolar disorder ([facility])    type 2  . Depression   . Hypothyroidism   . Infertility, female    treatment with oligammenorhea  Dr Chevis Pretty   . Migraine   . Restless legs syndrome    currently no problems per patient  . SVD (spontaneous vaginal delivery)    x 1  . Thyroiditis     Patient Active Problem List   Diagnosis Date Noted  . S/P laparoscopic assisted vaginal hysterectomy (LAVH) 12/29/2017  . Restless leg syndrome 11/05/2015  . Bipolar 2 disorder, major depressive episode ([facility]) 11/04/2015  . Anxiety state, unspecified 09/27/2013  . Fatigue 01/02/2011  . VITAMIN D DEFICIENCY 01/05/2009  . Hypercalcemia 01/05/2009  . NUMBNESS 01/05/2009  . THYROIDITIS 01/02/2009  . PALPITATIONS, RECURRENT 01/02/2009  . SINUSITIS- ACUTE-NOS 08/15/2008  . FEMALE INFERTILITY 07/18/2008  . WEIGHT GAIN 07/18/2008  . ACUTE TONSILLITIS 12/30/2007  . URI 12/30/2007  . MIGRAINE HEADACHE 08/12/2007  . AMENORRHEA 08/12/2007    Past Surgical History:  Procedure Laterality Date   . CYSTOSCOPY  12/29/2017   Procedure: CYSTOSCOPY;  Surgeon: Ranae Pila, MD;  Location: [facility];  Service: Gynecology;;  . DILATION AND CURETTAGE OF UTERUS    . LAPAROSCOPIC VAGINAL HYSTERECTOMY WITH SALPINGECTOMY Bilateral 12/29/2017   Procedure: LAPAROSCOPIC ASSISTED VAGINAL HYSTERECTOMY WITH SALPINGECTOMY, mccalls culdoplasty;  Surgeon: Ranae Pila, MD;  Location: [facility];  Service: Gynecology;  Laterality: Bilateral;  . WISDOM TOOTH EXTRACTION      OB History    Gravida  3   Para  1   Term      Preterm  1   AB  2   Living  1     SAB      TAB      Ectopic  2   Multiple      Live Births               Home Medications    Prior to Admission medications   Medication Sig Start Date End Date Taking? Authorizing Provider  gabapentin (NEURONTIN) 300 MG capsule Take 1 capsule (300 mg total) by mouth 3 (three) times daily. 12/06/19   Lomax, Amy, NP  ibuprofen (ADVIL,MOTRIN) 800 MG tablet Take 1 tablet (800 mg total) by mouth every 8 (eight) hours as needed. 12/31/17   Ranae Pila, MD  levothyroxine (SYNTHROID, LEVOTHROID) 50 MCG tablet Take 50 mcg by mouth  daily before breakfast.  09/10/13   [provider]  meloxicam (MOBIC) 7.5 MG tablet Take 1 tablet (7.5 mg total) by mouth daily. 12/06/19   Wurst, [place], PA-C  ondansetron (ZOFRAN) 4 MG tablet Take 1 tablet (4 mg total) by mouth every 8 (eight) hours as needed for nausea or vomiting. 01/25/20   Jamillah Camilo, Zachery Dakins, FNP  rOPINIRole (REQUIP) 3 MG tablet Take 3 mg by mouth at bedtime.    [provider]  traMADol (ULTRAM) 50 MG tablet Take 1 tablet (50 mg total) by mouth every 12 (twelve) hours as needed for severe pain. 12/20/19   Wurst, [place], PA-C  ARIPiprazole (ABILIFY) 5 MG tablet Take 1 tablet (5 mg total) by mouth daily. 10/29/17 12/06/19  Benjaman Pott, MD    Family History Family History  Problem Relation Age of Onset  . Thyroid disease Mother   . Nephrolithiasis  Mother   . Urolithiasis Mother   . Heart attack Mother        signs but neg eval  . Nephrolithiasis Father   . Urolithiasis Father   . Anxiety disorder Father   . Drug abuse Father   . Thyroid disease Sister   . Heart disease Maternal Grandmother     Social History Social History   Tobacco Use  . Smoking status: Never Smoker  . Smokeless tobacco: Never Used  Substance Use Topics  . Alcohol use: Yes    Comment: Rarely  . Drug use: No     Allergies   Compazine [prochlorperazine] and Oxycodone   Review of Systems Review of Systems  Constitutional: Negative.   Respiratory: Negative.   Cardiovascular: Negative.   Gastrointestinal: Negative.   Genitourinary: Positive for flank pain.  All other systems reviewed and are negative.    Physical Exam Triage Vital Signs ED Triage Vitals  Enc Vitals Group     BP 01/25/20 1640 108/73     Pulse Rate 01/25/20 1640 75     Resp 01/25/20 1640 17     Temp 01/25/20 1640 98.2 F (36.8 C)     Temp Source 01/25/20 1640 Oral     SpO2 01/25/20 1640 98 %     Weight --      Height --      Head Circumference --      Peak Flow --      Pain Score 01/25/20 1646 8     Pain Loc --      Pain Edu? --      Excl. in GC? --    No data found.  Updated Vital Signs BP 108/73 (BP Location: Right Arm)   Pulse 75   Temp 98.2 F (36.8 C) (Oral)   Resp 17   LMP  (LMP Unknown)   SpO2 98%   Visual Acuity Right Eye Distance:   Left Eye Distance:   Bilateral Distance:    Right Eye Near:   Left Eye Near:    Bilateral Near:     Physical Exam Vitals and nursing note reviewed.  Constitutional:      General: She is not in acute distress.    Appearance: Normal appearance. She is normal weight. She is not ill-appearing or toxic-appearing.  Cardiovascular:     Rate and Rhythm: Normal rate and regular rhythm.     Pulses: Normal pulses.     Heart sounds: Normal heart sounds. No murmur. No friction rub. No gallop.   Pulmonary:     Effort:  Pulmonary effort is  normal. No respiratory distress.     Breath sounds: Normal breath sounds. No stridor. No wheezing, rhonchi or rales.  Chest:     Chest wall: No tenderness.  Abdominal:     General: Abdomen is flat. Bowel sounds are normal. There is no distension.     Palpations: Abdomen is soft.     Tenderness: There is abdominal tenderness in the right lower quadrant. There is left CVA tenderness. There is no guarding or rebound. Negative signs include McBurney's sign.     Hernia: No hernia is present.  Neurological:     Mental Status: She is alert.      UC Treatments / Results  Labs (all labs ordered are listed, but only abnormal results are displayed) Labs Reviewed  POCT URINALYSIS DIP (MANUAL ENTRY) - Normal    EKG   Radiology No results found.  Procedures Procedures (including critical care time)  Medications Ordered in UC Medications - No data to display  Initial Impression / Assessment and Plan / UC Course  I have reviewed the triage vital signs and the nursing notes.  Pertinent labs & imaging results that were available during my care of the patient were reviewed by me and considered in my medical decision making (see chart for details).     Patient stable at discharge POCT urine analysis was negative. Patient was given the option to go to the ED at this time as there is a concern of other abdominal disease process that need to be ruled out.  She is in agreement.   Zofran was prescribed for symptomat relief. Advised patient to go to ED for worsening of symptoms  Final Clinical Impressions(s) / UC Diagnoses   Final diagnoses:  Flank pain     Discharge Instructions     Get rest and drink fluids Zofran prescribed.  Take as directed.    DIET Instructions:  30 minutes after taking nausea medicine, begin with sips of clear liquids. If able to hold down 2 - 4 ounces for 30 minutes, begin drinking more. Increase your fluid intake to replace losses.  Clear liquids only for 24 hours (water, tea, sport drinks, clear flat ginger ale or cola and juices, broth, jello, popsicles, ect). Advance to bland foods, applesauce, rice, baked or boiled chicken, ect. Avoid milk, greasy foods and anything that doesn't agree with you.  If you experience new or worsening symptoms return or go to ER such as fever, chills, nausea, vomiting, diarrhea, bloody or dark tarry stools, constipation, urinary symptoms, worsening abdominal discomfort, symptoms that do not improve with medications, inability to keep fluids down, etc...  Reviewed expectations re: course of current medical issues. Questions answered. Outlined signs and symptoms indicating need for more acute intervention. Patient verbalized understanding. After Visit Summary given.     ED Prescriptions    Medication Sig Dispense Auth. Provider   ondansetron (ZOFRAN) 4 MG tablet Take 1 tablet (4 mg total) by mouth every 8 (eight) hours as needed for nausea or vomiting. 30 tablet Kenitra Leventhal, Darrelyn Hillock, FNP     PDMP not reviewed this encounter.   Emerson Monte, FNP 01/25/20 1726

## 2020-02-24 ENCOUNTER — Other Ambulatory Visit: Payer: Self-pay

## 2020-02-24 ENCOUNTER — Ambulatory Visit
Admission: EM | Admit: 2020-02-24 | Discharge: 2020-02-24 | Disposition: A | Discharge status: Home / Self Care | Payer: BLUE CROSS/BLUE SHIELD | Attending: Emergency Medicine | Admitting: Emergency Medicine

## 2020-02-24 ENCOUNTER — Encounter: Payer: Self-pay | Admitting: Emergency Medicine

## 2020-02-24 DIAGNOSIS — R197 Diarrhea, unspecified: Secondary | ICD-10-CM | POA: Diagnosis not present

## 2020-02-24 DIAGNOSIS — R6889 Other general symptoms and signs: Secondary | ICD-10-CM

## 2020-02-24 DIAGNOSIS — R519 Headache, unspecified: Secondary | ICD-10-CM

## 2020-02-24 DIAGNOSIS — Z20822 Contact with and (suspected) exposure to covid-19: Secondary | ICD-10-CM

## 2020-02-24 LAB — POC SARS CORONAVIRUS 2 AG -  ED: SARS Coronavirus 2 Ag: NEGATIVE

## 2020-02-24 MED ORDER — ONDANSETRON 4 MG PO TBDP
4.0000 mg | ORAL_TABLET | Freq: Once | ORAL | Status: AC
  Administered 2020-02-24: 4 mg via ORAL

## 2020-02-24 MED ORDER — ONDANSETRON HCL 4 MG PO TABS: 4.0000 mg | ORAL_TABLET | Freq: Four times a day (QID) | ORAL | 0 refills | Status: DC

## 2020-02-24 MED ORDER — DEXAMETHASONE SODIUM PHOSPHATE 10 MG/ML IJ SOLN
10.0000 mg | Freq: Once | INTRAMUSCULAR | Status: AC
  Administered 2020-02-24: 10 mg via INTRAMUSCULAR

## 2020-02-24 MED ORDER — KETOROLAC TROMETHAMINE 30 MG/ML IJ SOLN
30.0000 mg | Freq: Once | INTRAMUSCULAR | Status: AC
  Administered 2020-02-24: 30 mg via INTRAMUSCULAR

## 2020-02-24 NOTE — Discharge Instructions (Addendum)
COVID testing ordered.  It will take between 2-5 days for test results.  Someone will contact you regarding abnormal results.    In the meantime: You should remain isolated in your home for 10 days from symptom onset AND greater than 72 hours after symptoms resolution (absence of fever without the use of fever-reducing medication and improvement in respiratory symptoms), whichever is longer Get plenty of rest and push fluids Supplement with OTC pedialyte Zofran as needed for nausea Use OTC zyrtec for nasal congestion, runny nose, and/or sore throat Use OTC flonase for nasal congestion and runny nose Use medications daily for symptom relief Use OTC medications like ibuprofen or tylenol as needed fever or pain Call or go to the ED if you have any new or worsening symptoms such as fever, cough, shortness of breath, chest tightness, chest pain, turning blue, changes in mental status, etc..Marland Kitchen

## 2020-02-24 NOTE — ED Provider Notes (Signed)
[place]   [id number] 02/24/20 Arrival Time: 1245   CC: COVID symptoms  SUBJECTIVE: History from: patient.  Erica Larson is a 44 y.o. female who presents with headache, n/v, and diarrhea x 3 days.  Patient unable to quantify amount.  Admits to COVID exposure 4 days ago.  Has tried OTC medications without relief.  Symptoms are made worse with eating.  Tolerating sips of ginger ale.  Denies previous COVID infection in the past.   Denies fever, chills, fatigue, sinus pain, rhinorrhea, sore throat, SOB, wheezing, chest pain, nausea, changes in bowel or bladder habits.    ROS: As per HPI.  All other pertinent ROS negative.     Past Medical History:  Diagnosis Date  . Acne   . Amenorrhea   . Anxiety   . Bipolar disorder ([place])    type 2  . Depression   . Hypothyroidism   . Infertility, female    treatment with oligammenorhea  Dr Carren Rang   . Migraine   . Restless legs syndrome    currently no problems per patient  . SVD (spontaneous vaginal delivery)    x 1  . Thyroiditis    Past Surgical History:  Procedure Laterality Date  . CYSTOSCOPY  12/29/2017   Procedure: CYSTOSCOPY;  Surgeon: Tyson Dense, MD;  Location: [facility];  Service: Gynecology;;  . DILATION AND CURETTAGE OF UTERUS    . LAPAROSCOPIC VAGINAL HYSTERECTOMY WITH SALPINGECTOMY Bilateral 12/29/2017   Procedure: LAPAROSCOPIC ASSISTED VAGINAL HYSTERECTOMY WITH SALPINGECTOMY, mccalls culdoplasty;  Surgeon: Tyson Dense, MD;  Location: [facility];  Service: Gynecology;  Laterality: Bilateral;  . WISDOM TOOTH EXTRACTION     Allergies  Allergen Reactions  . Compazine [Prochlorperazine] Other (See Comments)    Legs jumping   . Oxycodone Itching   No current facility-administered medications on file prior to encounter.   Current Outpatient Medications on File Prior to Encounter  Medication Sig Dispense Refill  . gabapentin (NEURONTIN) 300 MG capsule Take 1 capsule (300 mg total) by mouth 3 (three)  times daily. 270 capsule 3  . ibuprofen (ADVIL,MOTRIN) 800 MG tablet Take 1 tablet (800 mg total) by mouth every 8 (eight) hours as needed. 30 tablet 0  . levothyroxine (SYNTHROID, LEVOTHROID) 50 MCG tablet Take 50 mcg by mouth daily before breakfast.     . rOPINIRole (REQUIP) 3 MG tablet Take 3 mg by mouth at bedtime.    . [DISCONTINUED] ARIPiprazole (ABILIFY) 5 MG tablet Take 1 tablet (5 mg total) by mouth daily. 30 tablet 2   Social History   Socioeconomic History  . Marital status: Married    Spouse name: [name]  . Number of children: 1  . Years of education: Some college  . Highest education level: Not on file  Occupational History  . Occupation: Engineer, maintenance  . Occupation: Private sitting  Tobacco Use  . Smoking status: Never Smoker  . Smokeless tobacco: Never Used  Substance and Sexual Activity  . Alcohol use: Yes    Comment: Rarely  . Drug use: No  . Sexual activity: Yes    Birth control/protection: None  Other Topics Concern  . Not on file  Social History Narrative   Lives with husband   hhof 3 married non smoker     Daughter husband and pet dog   Caffeine use: Diet coke   Social Determinants of Radio broadcast assistant Strain:   . Difficulty of Paying Living Expenses:   Food Insecurity:   .  Worried About Charity fundraiser in the Last Year:   . Arboriculturist in the Last Year:   Transportation Needs:   . Film/video editor (Medical):   Marland Kitchen Lack of Transportation (Non-Medical):   Physical Activity:   . Days of Exercise per Week:   . Minutes of Exercise per Session:   Stress:   . Feeling of Stress :   Social Connections:   . Frequency of Communication with Friends and Family:   . Frequency of Social Gatherings with Friends and Family:   . Attends Religious Services:   . Active Member of Clubs or Organizations:   . Attends Archivist Meetings:   Marland Kitchen Marital Status:   Intimate Partner Violence:   . Fear of Current or Ex-Partner:   .  Emotionally Abused:   Marland Kitchen Physically Abused:   . Sexually Abused:    Family History  Problem Relation Age of Onset  . Thyroid disease Mother   . Nephrolithiasis Mother   . Urolithiasis Mother   . Heart attack Mother        signs but neg eval  . Nephrolithiasis Father   . Urolithiasis Father   . Anxiety disorder Father   . Drug abuse Father   . Thyroid disease Sister   . Heart disease Maternal Grandmother     OBJECTIVE:  Vitals:   02/24/20 0948  BP: 124/90  Pulse: 65  Resp: 15  Temp: 97.9 F (36.6 C)  TempSrc: Oral  SpO2: 98%     General appearance: alert; appears mildly fatigued, but nontoxic; speaking in full sentences and tolerating own secretions HEENT: NCAT; Ears: EACs clear, TMs pearly gray; Eyes: PERRL.  EOM grossly intact.  Nose: nares patent without rhinorrhea, Throat: oropharynx clear, tonsils non erythematous or enlarged, uvula midline  Neck: supple without LAD Lungs: unlabored respirations, symmetrical air entry; cough: absent; no respiratory distress; CTAB Heart: regular rate and rhythm.   Abdomen: soft, nondistended, normal active bowel sounds; nontender to palpation; no guarding  Skin: warm and dry Psychological: alert and cooperative; normal mood and affect  LABS:  Results for orders placed or performed during the hospital encounter of 02/24/20 (from the past 24 hour(s))  POC SARS Coronavirus 2 Ag-ED - Nasal Swab (BD Veritor Kit)     Status: None   Collection Time: 02/24/20 10:17 AM  Result Value Ref Range   SARS Coronavirus 2 Ag Negative Negative    ASSESSMENT & PLAN:  1. Diarrhea, unspecified type   2. Suspected COVID-19 virus infection   3. Flu-like symptoms   4. Acute nonintractable headache, unspecified headache type     Meds ordered this encounter  Medications  . ondansetron (ZOFRAN) 4 MG tablet    Sig: Take 1 tablet (4 mg total) by mouth every 6 (six) hours.    Dispense:  12 tablet    Refill:  0    Order Specific Question:    Supervising Provider    Answer:   Raylene Everts [[id number]]  . ketorolac (TORADOL) 30 MG/ML injection 30 mg  . dexamethasone (DECADRON) injection 10 mg  . ondansetron (ZOFRAN-ODT) disintegrating tablet 4 mg   COVID testing ordered.  It will take between 2-5 days for test results.  Someone will contact you regarding abnormal results.    In the meantime: You should remain isolated in your home for 10 days from symptom onset AND greater than 72 hours after symptoms resolution (absence of fever without the use of fever-reducing medication and  improvement in respiratory symptoms), whichever is longer Get plenty of rest and push fluids Supplement with OTC pedialyte Zofran as needed for nausea Use OTC zyrtec for nasal congestion, runny nose, and/or sore throat Use OTC flonase for nasal congestion and runny nose Use medications daily for symptom relief Use OTC medications like ibuprofen or tylenol as needed fever or pain Call or go to the ED if you have any new or worsening symptoms such as fever, cough, shortness of breath, chest tightness, chest pain, turning blue, changes in mental status, etc...   Migraine cocktail given in office Rapid COVID test performed Rapid COVID negative.   Reviewed expectations re: course of current medical issues. Questions answered. Outlined signs and symptoms indicating need for more acute intervention. Patient verbalized understanding. After Visit Summary given.         Lestine Box, PA-C 02/24/20 1035

## 2020-02-24 NOTE — ED Triage Notes (Signed)
Pt presents with C/O headache, n/v, and diarrhea since Monday 01/21/2020. Pt was exposed to someone with COVID on Sunday 01/20/2020.

## 2020-02-25 LAB — NOVEL CORONAVIRUS, NAA: SARS-CoV-2, NAA: NOT DETECTED

## 2020-02-25 LAB — SARS-COV-2, NAA 2 DAY TAT

## 2020-06-08 ENCOUNTER — Encounter: Payer: Self-pay | Admitting: Emergency Medicine

## 2020-06-08 ENCOUNTER — Ambulatory Visit
Admission: EM | Admit: 2020-06-08 | Discharge: 2020-06-08 | Disposition: A | Discharge status: Home / Self Care | Payer: BLUE CROSS/BLUE SHIELD

## 2020-06-08 ENCOUNTER — Other Ambulatory Visit: Payer: Self-pay

## 2020-06-08 ENCOUNTER — Emergency Department (HOSPITAL_COMMUNITY): Admission: EM | Admit: 2020-06-08 | Discharge: 2020-06-08 | Discharge status: Against Medical Advice | Payer: Self-pay

## 2020-06-08 ENCOUNTER — Emergency Department
Admission: EM | Admit: 2020-06-08 | Discharge: 2020-06-08 | Disposition: A | Discharge status: Home / Self Care | Payer: BLUE CROSS/BLUE SHIELD | Attending: Emergency Medicine | Admitting: Emergency Medicine

## 2020-06-08 ENCOUNTER — Emergency Department: Payer: BLUE CROSS/BLUE SHIELD

## 2020-06-08 DIAGNOSIS — Y9389 Activity, other specified: Secondary | ICD-10-CM | POA: Insufficient documentation

## 2020-06-08 DIAGNOSIS — Y9289 Other specified places as the place of occurrence of the external cause: Secondary | ICD-10-CM | POA: Diagnosis not present

## 2020-06-08 DIAGNOSIS — W19XXXA Unspecified fall, initial encounter: Secondary | ICD-10-CM

## 2020-06-08 DIAGNOSIS — W108XXA Fall (on) (from) other stairs and steps, initial encounter: Secondary | ICD-10-CM | POA: Diagnosis not present

## 2020-06-08 DIAGNOSIS — Y998 Other external cause status: Secondary | ICD-10-CM | POA: Insufficient documentation

## 2020-06-08 DIAGNOSIS — S0101XA Laceration without foreign body of scalp, initial encounter: Secondary | ICD-10-CM | POA: Insufficient documentation

## 2020-06-08 DIAGNOSIS — E039 Hypothyroidism, unspecified: Secondary | ICD-10-CM | POA: Diagnosis not present

## 2020-06-08 DIAGNOSIS — S61211A Laceration without foreign body of left index finger without damage to nail, initial encounter: Secondary | ICD-10-CM | POA: Diagnosis present

## 2020-06-08 MED ORDER — LIDOCAINE HCL 1 % IJ SOLN
5.0000 mL | Freq: Once | INTRAMUSCULAR | Status: AC
  Administered 2020-06-08: 5 mL
  Filled 2020-06-08: qty 10

## 2020-06-08 MED ORDER — ONDANSETRON 4 MG PO TBDP: 4.0000 mg | ORAL_TABLET | Freq: Three times a day (TID) | ORAL | 0 refills | Status: AC | PRN

## 2020-06-08 MED ORDER — FLUCONAZOLE 150 MG PO TABS
150.0000 mg | ORAL_TABLET | Freq: Every day | ORAL | 0 refills | Status: DC
Start: 2020-06-08 — End: 2020-08-21

## 2020-06-08 MED ORDER — FENTANYL CITRATE (PF) 100 MCG/2ML IJ SOLN
50.0000 ug | Freq: Once | INTRAMUSCULAR | Status: AC
  Administered 2020-06-08: 50 ug via INTRAMUSCULAR
  Filled 2020-06-08: qty 2

## 2020-06-08 MED ORDER — HYDROCODONE-ACETAMINOPHEN 5-325 MG PO TABS: 1.0000 | ORAL_TABLET | ORAL | 0 refills | Status: DC | PRN

## 2020-06-08 MED ORDER — CEPHALEXIN 500 MG PO CAPS: 500.0000 mg | ORAL_CAPSULE | Freq: Three times a day (TID) | ORAL | 0 refills | Status: AC

## 2020-06-08 MED ORDER — ONDANSETRON 4 MG PO TBDP
4.0000 mg | ORAL_TABLET | Freq: Once | ORAL | Status: AC
  Administered 2020-06-08: 4 mg via ORAL
  Filled 2020-06-08: qty 1

## 2020-06-08 MED ORDER — TETANUS-DIPHTH-ACELL PERTUSSIS 5-2.5-18.5 LF-MCG/0.5 IM SUSP
0.5000 mL | Freq: Once | INTRAMUSCULAR | Status: AC
  Administered 2020-06-08: 0.5 mL via INTRAMUSCULAR
  Filled 2020-06-08: qty 0.5

## 2020-06-08 NOTE — ED Notes (Signed)
Patient reports mechanical fall down six stairs. Swelling and bruising seen to face/forehead. Patient c/o headache, and 2cm laceration to anterior hairline. Patient also has abrasion to lateral/anterior head, proximal to hairline. Patient c/o 2cm laceration to index finger left hand.

## 2020-06-08 NOTE — ED Triage Notes (Addendum)
Pt states she fell down 6 steps, states stepped on a toy. States positive loc, co pain head, lac to left index finger, and right eye bruising noted. Also neck pain, no back pain.

## 2020-06-08 NOTE — Discharge Instructions (Signed)
Have staples removed in 5 days. Have sutures removed in 7 days. Please take Keflex 3 times daily for the next 7 days.

## 2020-06-08 NOTE — ED Triage Notes (Signed)
Laceration to LT side of head and LT index finger after tripping and falling down 6 steps. Bleeding is controlled.  Pt does have knot to LT side of head with some bruising. C/o of pain to her head. Ambulatory to room with no difficulties, a&o x 4.

## 2020-06-08 NOTE — ED Notes (Signed)
Reviewed discharge instructions, follow-up care, and prescriptions with patient. Patient verbalized understanding of all information reviewed. Patient stable, with no distress noted at this time.    

## 2020-06-08 NOTE — ED Provider Notes (Signed)
Emergency Department Provider Note  ____________________________________________  Time seen: Approximately 10:32 PM  I have reviewed the triage vital signs and the nursing notes.   HISTORY  Chief Complaint Fall   Historian Patient     HPI [name] is a 44 y.o. female presents to the emergency department after patient had a mechanical fall down 6 stairs.  Patient states that she tripped over a dog toy.  She sustained a 2-1/2 cm laceration along left parietal scalp and a 1 cm laceration along left inferior parietal scalp.  She has a 2 and half centimeter laceration along left index finger.  Patient thinks that she lost consciousness.  She denies chest pain, chest tightness and abdominal pain.  No numbness or tingling in the upper and lower extremities.   Past Medical History:  Diagnosis Date  . Acne   . Amenorrhea   . Anxiety   . Bipolar disorder (HCC)    type 2  . Depression   . Hypothyroidism   . Infertility, female    treatment with oligammenorhea  Dr Chevis Pretty   . Migraine   . Restless legs syndrome    currently no problems per patient  . SVD (spontaneous vaginal delivery)    x 1  . Thyroiditis      Immunizations up to date:  Yes.     Past Medical History:  Diagnosis Date  . Acne   . Amenorrhea   . Anxiety   . Bipolar disorder (HCC)    type 2  . Depression   . Hypothyroidism   . Infertility, female    treatment with oligammenorhea  Dr Chevis Pretty   . Migraine   . Restless legs syndrome    currently no problems per patient  . SVD (spontaneous vaginal delivery)    x 1  . Thyroiditis     Patient Active Problem List   Diagnosis Date Noted  . S/P laparoscopic assisted vaginal hysterectomy (LAVH) 12/29/2017  . Restless leg syndrome 11/05/2015  . Bipolar 2 disorder, major depressive episode (HCC) 11/04/2015  . Anxiety state, unspecified 09/27/2013  . Fatigue 01/02/2011  . VITAMIN D DEFICIENCY 01/05/2009  . Hypercalcemia 01/05/2009  . NUMBNESS  01/05/2009  . THYROIDITIS 01/02/2009  . PALPITATIONS, RECURRENT 01/02/2009  . SINUSITIS- ACUTE-NOS 08/15/2008  . FEMALE INFERTILITY 07/18/2008  . WEIGHT GAIN 07/18/2008  . ACUTE TONSILLITIS 12/30/2007  . URI 12/30/2007  . MIGRAINE HEADACHE 08/12/2007  . AMENORRHEA 08/12/2007    Past Surgical History:  Procedure Laterality Date  . CYSTOSCOPY  12/29/2017   Procedure: CYSTOSCOPY;  Surgeon: Ranae Pila, MD;  Location: [facility];  Service: Gynecology;;  . DILATION AND CURETTAGE OF UTERUS    . LAPAROSCOPIC VAGINAL HYSTERECTOMY WITH SALPINGECTOMY Bilateral 12/29/2017   Procedure: LAPAROSCOPIC ASSISTED VAGINAL HYSTERECTOMY WITH SALPINGECTOMY, mccalls culdoplasty;  Surgeon: Ranae Pila, MD;  Location: [facility];  Service: Gynecology;  Laterality: Bilateral;  . WISDOM TOOTH EXTRACTION      Prior to Admission medications   Medication Sig Start Date End Date Taking? Authorizing Provider  cephALEXin (KEFLEX) 500 MG capsule Take 1 capsule (500 mg total) by mouth 3 (three) times daily for 7 days. 06/08/20 06/15/20  Orvil Feil, PA-C  fluconazole (DIFLUCAN) 150 MG tablet Take 1 tablet (150 mg total) by mouth daily. 06/08/20   Orvil Feil, PA-C  gabapentin (NEURONTIN) 300 MG capsule Take 1 capsule (300 mg total) by mouth 3 (three) times daily. 12/06/19   Lomax, Amy, NP  HYDROcodone-acetaminophen (NORCO) 5-325 MG tablet  Take 1 tablet by mouth every 4 (four) hours as needed for moderate pain. 06/08/20   Orvil Feil, PA-C  ibuprofen (ADVIL,MOTRIN) 800 MG tablet Take 1 tablet (800 mg total) by mouth every 8 (eight) hours as needed. 12/31/17   Ranae Pila, MD  levothyroxine (SYNTHROID, LEVOTHROID) 50 MCG tablet Take 50 mcg by mouth daily before breakfast.  09/10/13   [provider]  ondansetron (ZOFRAN ODT) 4 MG disintegrating tablet Take 1 tablet (4 mg total) by mouth every 8 (eight) hours as needed for up to 5 days. 06/08/20 06/13/20  Orvil Feil, PA-C  ondansetron  (ZOFRAN) 4 MG tablet Take 1 tablet (4 mg total) by mouth every 6 (six) hours. 02/24/20   Wurst, [place], PA-C  rOPINIRole (REQUIP) 3 MG tablet Take 3 mg by mouth at bedtime.    [provider]  ARIPiprazole (ABILIFY) 5 MG tablet Take 1 tablet (5 mg total) by mouth daily. 10/29/17 12/06/19  Benjaman Pott, MD    Allergies Compazine [prochlorperazine] and Oxycodone  Family History  Problem Relation Age of Onset  . Thyroid disease Mother   . Nephrolithiasis Mother   . Urolithiasis Mother   . Heart attack Mother        signs but neg eval  . Nephrolithiasis Father   . Urolithiasis Father   . Anxiety disorder Father   . Drug abuse Father   . Thyroid disease Sister   . Heart disease Maternal Grandmother     Social History Social History   Tobacco Use  . Smoking status: Never Smoker  . Smokeless tobacco: Never Used  Vaping Use  . Vaping Use: Never used  Substance Use Topics  . Alcohol use: Yes    Comment: Rarely  . Drug use: No     Review of Systems  Constitutional: No fever/chills Eyes:  No discharge ENT: No upper respiratory complaints. Respiratory: no cough. No SOB/ use of accessory muscles to breath Gastrointestinal:   No nausea, no vomiting.  No diarrhea.  No constipation. Musculoskeletal: Negative for musculoskeletal pain. Skin: Patient has lacerations.  ____________________________________________   PHYSICAL EXAM:  VITAL SIGNS: ED Triage Vitals  Enc Vitals Group     BP 06/08/20 1927 128/85     Pulse Rate 06/08/20 1927 98     Resp 06/08/20 1927 18     Temp 06/08/20 1927 98.6 F (37 C)     Temp Source 06/08/20 1927 Oral     SpO2 06/08/20 1927 98 %     Weight 06/08/20 1929 161 lb (73 kg)     Height 06/08/20 1929 5\' 6"  (1.676 m)     Head Circumference --      Peak Flow --      Pain Score 06/08/20 1928 10     Pain Loc --      Pain Edu? --      Excl. in GC? --      Constitutional: Alert and oriented. Well appearing and in no acute  distress. Eyes: Conjunctivae are normal. PERRL. EOMI. patient has periorbital ecchymosis on the right. Head: Atraumatic.  Patient has a 2 and half centimeter laceration along superior aspect of left parietal scalp and a 1 cm laceration along left inferior parietal scalp. ENT:      Ears: No hemotympanum.      Nose: No congestion/rhinnorhea.      Mouth/Throat: Mucous membranes are moist.  Neck: No stridor.  Full range of motion.  Cardiovascular: Normal rate, regular rhythm. Normal  S1 and S2.  Good peripheral circulation. Respiratory: Normal respiratory effort without tachypnea or retractions. Lungs CTAB. Good air entry to the bases with no decreased or absent breath sounds Gastrointestinal: Bowel sounds x 4 quadrants. Soft and nontender to palpation. No guarding or rigidity. No distention. Musculoskeletal: Full range of motion to all extremities. No obvious deformities noted.  No flexor or extensor tendon deficits appreciated with testing at left index finger. Neurologic:  Normal for age. No gross focal neurologic deficits are appreciated.  Skin:  Skin is warm, dry and intact. No rash noted.  Patient has 2 and half centimeter linear laceration along the dorsal aspect of left index finger. Psychiatric: Mood and affect are normal for age. Speech and behavior are normal.   ____________________________________________   LABS (all labs ordered are listed, but only abnormal results are displayed)  Labs Reviewed - No data to display ____________________________________________  EKG   ____________________________________________  RADIOLOGY Geraldo Pitter, personally viewed and evaluated these images (plain radiographs) as part of my medical decision making, as well as reviewing the written report by the radiologist.  CT Head Wo Contrast  Result Date: 06/08/2020 CLINICAL DATA:  Larey Seat down 6 steps, loss of consciousness, head pain, right orbital bruising, neck pain EXAM: CT HEAD WITHOUT  CONTRAST CT MAXILLOFACIAL WITHOUT CONTRAST CT CERVICAL SPINE WITHOUT CONTRAST TECHNIQUE: Multidetector CT imaging of the head, cervical spine, and maxillofacial structures were performed using the standard protocol without intravenous contrast. Multiplanar CT image reconstructions of the cervical spine and maxillofacial structures were also generated. COMPARISON:  None. FINDINGS: CT HEAD FINDINGS Brain: No acute infarct or hemorrhage. Lateral ventricles are grossly unremarkable. 4 mm hyperdense structure in the third ventricle likely reflects a small colloid cyst. Remaining midline structures are unremarkable. No acute extra-axial fluid collections. No mass effect. Vascular: No hyperdense vessel or unexpected calcification. Skull: There is a large scalp hematoma along the left frontotemporal region, with no underlying fracture. The remainder of the calvarium is unremarkable. Other: None. CT MAXILLOFACIAL FINDINGS Osseous: No fracture or mandibular dislocation. No destructive process. Orbits: Negative. No traumatic or inflammatory finding. Sinuses: Clear. Soft tissues: Large left frontotemporal scalp hematoma. Remaining soft tissues are unremarkable. CT CERVICAL SPINE FINDINGS Alignment: Alignment is grossly anatomic. Skull base and vertebrae: No acute displaced fractures. Soft tissues and spinal canal: No prevertebral fluid or swelling. No visible canal hematoma. Disc levels: Mild spondylosis at C5-6, without significant compressive sequela. Mild facet hypertrophy on the right at C7/T1. Upper chest: Airway is patent.  Lung apices are clear. Other: Reconstructed images demonstrate no additional findings. IMPRESSION: 1. Large left frontotemporal scalp hematoma. No underlying fracture. 2. No acute intracranial process. 3. No evidence of acute facial bone fracture. 4. No acute cervical spine fracture. Electronically Signed   By: Sharlet Salina M.D.   On: 06/08/2020 20:18   CT Cervical Spine Wo Contrast  Result Date:  06/08/2020 CLINICAL DATA:  Larey Seat down 6 steps, loss of consciousness, head pain, right orbital bruising, neck pain EXAM: CT HEAD WITHOUT CONTRAST CT MAXILLOFACIAL WITHOUT CONTRAST CT CERVICAL SPINE WITHOUT CONTRAST TECHNIQUE: Multidetector CT imaging of the head, cervical spine, and maxillofacial structures were performed using the standard protocol without intravenous contrast. Multiplanar CT image reconstructions of the cervical spine and maxillofacial structures were also generated. COMPARISON:  None. FINDINGS: CT HEAD FINDINGS Brain: No acute infarct or hemorrhage. Lateral ventricles are grossly unremarkable. 4 mm hyperdense structure in the third ventricle likely reflects a small colloid cyst. Remaining midline structures are unremarkable. No  acute extra-axial fluid collections. No mass effect. Vascular: No hyperdense vessel or unexpected calcification. Skull: There is a large scalp hematoma along the left frontotemporal region, with no underlying fracture. The remainder of the calvarium is unremarkable. Other: None. CT MAXILLOFACIAL FINDINGS Osseous: No fracture or mandibular dislocation. No destructive process. Orbits: Negative. No traumatic or inflammatory finding. Sinuses: Clear. Soft tissues: Large left frontotemporal scalp hematoma. Remaining soft tissues are unremarkable. CT CERVICAL SPINE FINDINGS Alignment: Alignment is grossly anatomic. Skull base and vertebrae: No acute displaced fractures. Soft tissues and spinal canal: No prevertebral fluid or swelling. No visible canal hematoma. Disc levels: Mild spondylosis at C5-6, without significant compressive sequela. Mild facet hypertrophy on the right at C7/T1. Upper chest: Airway is patent.  Lung apices are clear. Other: Reconstructed images demonstrate no additional findings. IMPRESSION: 1. Large left frontotemporal scalp hematoma. No underlying fracture. 2. No acute intracranial process. 3. No evidence of acute facial bone fracture. 4. No acute cervical  spine fracture. Electronically Signed   By: Sharlet Salina M.D.   On: 06/08/2020 20:18   CT Maxillofacial Wo Contrast  Result Date: 06/08/2020 CLINICAL DATA:  Larey Seat down 6 steps, loss of consciousness, head pain, right orbital bruising, neck pain EXAM: CT HEAD WITHOUT CONTRAST CT MAXILLOFACIAL WITHOUT CONTRAST CT CERVICAL SPINE WITHOUT CONTRAST TECHNIQUE: Multidetector CT imaging of the head, cervical spine, and maxillofacial structures were performed using the standard protocol without intravenous contrast. Multiplanar CT image reconstructions of the cervical spine and maxillofacial structures were also generated. COMPARISON:  None. FINDINGS: CT HEAD FINDINGS Brain: No acute infarct or hemorrhage. Lateral ventricles are grossly unremarkable. 4 mm hyperdense structure in the third ventricle likely reflects a small colloid cyst. Remaining midline structures are unremarkable. No acute extra-axial fluid collections. No mass effect. Vascular: No hyperdense vessel or unexpected calcification. Skull: There is a large scalp hematoma along the left frontotemporal region, with no underlying fracture. The remainder of the calvarium is unremarkable. Other: None. CT MAXILLOFACIAL FINDINGS Osseous: No fracture or mandibular dislocation. No destructive process. Orbits: Negative. No traumatic or inflammatory finding. Sinuses: Clear. Soft tissues: Large left frontotemporal scalp hematoma. Remaining soft tissues are unremarkable. CT CERVICAL SPINE FINDINGS Alignment: Alignment is grossly anatomic. Skull base and vertebrae: No acute displaced fractures. Soft tissues and spinal canal: No prevertebral fluid or swelling. No visible canal hematoma. Disc levels: Mild spondylosis at C5-6, without significant compressive sequela. Mild facet hypertrophy on the right at C7/T1. Upper chest: Airway is patent.  Lung apices are clear. Other: Reconstructed images demonstrate no additional findings. IMPRESSION: 1. Large left frontotemporal scalp  hematoma. No underlying fracture. 2. No acute intracranial process. 3. No evidence of acute facial bone fracture. 4. No acute cervical spine fracture. Electronically Signed   By: Sharlet Salina M.D.   On: 06/08/2020 20:18    ____________________________________________    PROCEDURES  Procedure(s) performed:     Marland KitchenMarland KitchenLaceration Repair  Date/Time: 06/08/2020 11:04 PM Performed by: Orvil Feil, PA-C Authorized by: Orvil Feil, PA-C   Consent:    Consent obtained:  Verbal   Consent given by:  Patient Anesthesia (see MAR for exact dosages):    Anesthesia method:  None Laceration details:    Location:  Scalp   Scalp location:  L parietal   Wound length (cm): 2.5 cm and 1 cm. Repair type:    Repair type:  Simple Exploration:    Contaminated: no   Treatment:    Area cleansed with:  Betadine   Amount of cleaning:  Standard  Irrigation solution:  Sterile saline   Visualized foreign bodies/material removed: no   Skin repair:    Repair method:  Tissue adhesive and staples   Number of staples:  4 Approximation:    Approximation:  Close Post-procedure details:    Dressing:  Open (no dressing)   Patient tolerance of procedure:  Tolerated well, no immediate complications .Marland KitchenLaceration Repair  Date/Time: 06/08/2020 11:05 PM Performed by: Orvil Feil, PA-C Authorized by: Orvil Feil, PA-C   Consent:    Consent obtained:  Verbal   Consent given by:  Patient Anesthesia (see MAR for exact dosages):    Anesthesia method:  None Laceration details:    Location:  Finger   Finger location:  L index finger   Length (cm):  2.5   Depth (mm):  5 Repair type:    Repair type:  Simple Exploration:    Contaminated: no   Treatment:    Area cleansed with:  Betadine   Amount of cleaning:  Standard   Irrigation solution:  Sterile saline   Visualized foreign bodies/material removed: no   Skin repair:    Repair method:  Tissue adhesive and sutures   Suture size:  4-0    Suture technique:  Running locked   Number of sutures:  6 Approximation:    Approximation:  Close Post-procedure details:    Dressing:  Open (no dressing)   Patient tolerance of procedure:  Tolerated well, no immediate complications       Medications  lidocaine (XYLOCAINE) 1 % (with pres) injection 5 mL (5 mLs Infiltration Given by Other 06/08/20 2134)  fentaNYL (SUBLIMAZE) injection 50 mcg (50 mcg Intramuscular Given 06/08/20 2131)  ondansetron (ZOFRAN-ODT) disintegrating tablet 4 mg (4 mg Oral Given 06/08/20 2122)  Tdap (BOOSTRIX) injection 0.5 mL (0.5 mLs Intramuscular Given 06/08/20 2208)     ____________________________________________   INITIAL IMPRESSION / ASSESSMENT AND PLAN / ED COURSE  Pertinent labs & imaging results that were available during my care of the patient were reviewed by me and considered in my medical decision making (see chart for details).      Assessment and plan Fall 44 year old female presents to the emergency department after a mechanical, nonsyncopal fall.  Vital signs were reassuring at triage.  On physical exam, patient had 2 scalp lacerations and a left index finger laceration which were repaired in the emergency department without complication.  Neuro exam was within reference range.  CT head, cervical spine and CT maxillofacial revealed no evidence of skull fracture, intracranial bleed or C-spine fracture.  Patient was advised to have staples removed by primary care in 5 days and sutures removed in 7 days.  Her tetanus status was updated in the emergency department.  She was discharged with Keflex.   ____________________________________________  FINAL CLINICAL IMPRESSION(S) / ED DIAGNOSES  Final diagnoses:  Fall, initial encounter      NEW MEDICATIONS STARTED DURING THIS VISIT:  ED Discharge Orders         Ordered    HYDROcodone-acetaminophen (NORCO) 5-325 MG tablet  Every 4 hours PRN     Discontinue  Reprint     06/08/20 2254     ondansetron (ZOFRAN ODT) 4 MG disintegrating tablet  Every 8 hours PRN     Discontinue  Reprint     06/08/20 2254    cephALEXin (KEFLEX) 500 MG capsule  3 times daily     Discontinue  Reprint     06/08/20 2255    fluconazole (DIFLUCAN) 150 MG  tablet  Daily     Discontinue  Reprint     06/08/20 2300              This chart was dictated using voice recognition software/Dragon. Despite best efforts to proofread, errors can occur which can change the meaning. Any change was purely unintentional.     Gasper Lloyd 06/08/20 2307    Chesley Noon, MD 06/09/20 0005

## 2020-06-08 NOTE — ED Notes (Signed)
Patient is being discharged from the Urgent Care and sent to the Emergency Department via pov . Per K. Avegno, patient is in need of higher level of care due to head injury. Patient is aware and verbalizes understanding of plan of care.  Vitals:   06/08/20 1731  BP: 120/82  Pulse: 79  Resp: 18  Temp: 98.5 F (36.9 C)  SpO2: 98%

## 2020-08-08 ENCOUNTER — Emergency Department (HOSPITAL_COMMUNITY)
Admission: EM | Admit: 2020-08-08 | Discharge: 2020-08-08 | Disposition: A | Discharge status: Home / Self Care | Payer: BC Managed Care – PPO | Attending: Emergency Medicine | Admitting: Emergency Medicine

## 2020-08-08 ENCOUNTER — Encounter (HOSPITAL_COMMUNITY): Payer: Self-pay | Admitting: Emergency Medicine

## 2020-08-08 ENCOUNTER — Other Ambulatory Visit: Payer: Self-pay

## 2020-08-08 ENCOUNTER — Emergency Department (HOSPITAL_COMMUNITY): Payer: BC Managed Care – PPO

## 2020-08-08 DIAGNOSIS — S060X0A Concussion without loss of consciousness, initial encounter: Secondary | ICD-10-CM | POA: Diagnosis not present

## 2020-08-08 DIAGNOSIS — Y93E1 Activity, personal bathing and showering: Secondary | ICD-10-CM | POA: Insufficient documentation

## 2020-08-08 DIAGNOSIS — W182XXA Fall in (into) shower or empty bathtub, initial encounter: Secondary | ICD-10-CM | POA: Insufficient documentation

## 2020-08-08 DIAGNOSIS — E039 Hypothyroidism, unspecified: Secondary | ICD-10-CM | POA: Insufficient documentation

## 2020-08-08 DIAGNOSIS — Z79890 Hormone replacement therapy (postmenopausal): Secondary | ICD-10-CM | POA: Diagnosis not present

## 2020-08-08 DIAGNOSIS — R1110 Vomiting, unspecified: Secondary | ICD-10-CM | POA: Insufficient documentation

## 2020-08-08 DIAGNOSIS — S0990XA Unspecified injury of head, initial encounter: Secondary | ICD-10-CM | POA: Diagnosis present

## 2020-08-08 DIAGNOSIS — W19XXXA Unspecified fall, initial encounter: Secondary | ICD-10-CM

## 2020-08-08 DIAGNOSIS — S0101XA Laceration without foreign body of scalp, initial encounter: Secondary | ICD-10-CM | POA: Diagnosis not present

## 2020-08-08 DIAGNOSIS — S0003XA Contusion of scalp, initial encounter: Secondary | ICD-10-CM

## 2020-08-08 MED ORDER — HYDROCODONE-ACETAMINOPHEN 5-325 MG PO TABS: 1.0000 | ORAL_TABLET | Freq: Four times a day (QID) | ORAL | 0 refills | Status: DC | PRN

## 2020-08-08 MED ORDER — ONDANSETRON HCL 4 MG/2ML IJ SOLN
4.0000 mg | Freq: Once | INTRAMUSCULAR | Status: AC
  Administered 2020-08-08: 4 mg via INTRAVENOUS
  Filled 2020-08-08: qty 2

## 2020-08-08 NOTE — Discharge Instructions (Addendum)
You were seen today for a fall.  Your CT scan is reassuring.  You likely sustained a concussion.  You also sustained 2 lacerations to the scalp that will require suture removal in 7 to 10 days.  If you find yourself in a situation where you feel unsafe, please seek out help.  Resources are provided.
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
## 2020-08-08 NOTE — ED Triage Notes (Signed)
Pt reports falling in the shower. Pt has 2 lacerations to the posterior head. Bleeding controlled at this time. Pt actively vomiting in triage.

## 2020-08-20 ENCOUNTER — Emergency Department
Admission: EM | Admit: 2020-08-20 | Discharge: 2020-08-20 | Discharge status: Against Medical Advice | Payer: BC Managed Care – PPO

## 2020-08-21 ENCOUNTER — Other Ambulatory Visit: Payer: Self-pay

## 2020-08-21 ENCOUNTER — Ambulatory Visit
Admission: RE | Admit: 2020-08-21 | Discharge: 2020-08-21 | Disposition: A | Discharge status: Home / Self Care | Payer: BC Managed Care – PPO | Source: Ambulatory Visit | Attending: Emergency Medicine | Admitting: Emergency Medicine

## 2020-08-21 ENCOUNTER — Ambulatory Visit: Payer: Self-pay

## 2020-08-21 VITALS — BP 111/76 | HR 80 | Temp 98.1°F | Resp 16

## 2020-08-21 DIAGNOSIS — Z9079 Acquired absence of other genital organ(s): Secondary | ICD-10-CM | POA: Diagnosis not present

## 2020-08-21 DIAGNOSIS — N939 Abnormal uterine and vaginal bleeding, unspecified: Secondary | ICD-10-CM

## 2020-08-21 DIAGNOSIS — N898 Other specified noninflammatory disorders of vagina: Secondary | ICD-10-CM

## 2020-08-21 DIAGNOSIS — R112 Nausea with vomiting, unspecified: Secondary | ICD-10-CM | POA: Diagnosis not present

## 2020-08-21 DIAGNOSIS — R300 Dysuria: Secondary | ICD-10-CM

## 2020-08-21 LAB — POCT URINALYSIS DIP (MANUAL ENTRY)
Bilirubin, UA: NEGATIVE
Blood, UA: NEGATIVE
Glucose, UA: NEGATIVE mg/dL
Ketones, POC UA: NEGATIVE mg/dL
Leukocytes, UA: NEGATIVE
Nitrite, UA: NEGATIVE
Protein Ur, POC: NEGATIVE mg/dL
Spec Grav, UA: >=1.03 — AB (ref 1.010–1.025)
Urobilinogen, UA: 0.2 E.U./dL
pH, UA: 5 (ref 5.0–8.0)

## 2020-08-21 MED ORDER — METRONIDAZOLE 500 MG PO TABS
500.0000 mg | ORAL_TABLET | Freq: Two times a day (BID) | ORAL | 0 refills | Status: DC
Start: 2020-08-21 — End: 2020-11-07

## 2020-08-21 MED ORDER — FLUCONAZOLE 150 MG PO TABS: 150.0000 mg | ORAL_TABLET | Freq: Once | ORAL | 0 refills | Status: AC

## 2020-08-21 NOTE — ED Provider Notes (Signed)
[facility]   Chief Complaint  Patient presents with  . Abdominal Pain  . Emesis     SUBJECTIVE:  Erica Larson is a 44 y.o. female who presented to the urgent care for complaint of nausea, vomiting, vaginal spotting, vaginal discharge and dysuria for the past few days.  Report thin white vaginal discharge.  Currently her spotting has now resolved.  Denies a precipitating event, recent sexual encounter, excessive caffeine intake.  Denies abdominal/flank pain.  Has tried OTC medications without relief.  Symptoms are made worse with urination.  Admits to similar symptoms in the past.  Denies fever, chills, nausea, vomiting, abdominal pain, flank pain, abnormal vaginal discharge or bleeding, hematuria.    LMP: No LMP recorded (lmp unknown). Patient has had a hysterectomy.  ROS: As in HPI.  All other pertinent ROS negative.     Past Medical History:  Diagnosis Date  . Acne   . Amenorrhea   . Anxiety   . Bipolar disorder (HCC)    type 2  . Depression   . Hypothyroidism   . Infertility, female    treatment with oligammenorhea  Dr Chevis Pretty   . Migraine   . Restless legs syndrome    currently no problems per patient  . SVD (spontaneous vaginal delivery)    x 1  . Thyroiditis    Past Surgical History:  Procedure Laterality Date  . CYSTOSCOPY  12/29/2017   Procedure: CYSTOSCOPY;  Surgeon: Ranae Pila, MD;  Location: [facility];  Service: Gynecology;;  . DILATION AND CURETTAGE OF UTERUS    . LAPAROSCOPIC VAGINAL HYSTERECTOMY WITH SALPINGECTOMY Bilateral 12/29/2017   Procedure: LAPAROSCOPIC ASSISTED VAGINAL HYSTERECTOMY WITH SALPINGECTOMY, mccalls culdoplasty;  Surgeon: Ranae Pila, MD;  Location: [facility];  Service: Gynecology;  Laterality: Bilateral;  . WISDOM TOOTH EXTRACTION     Allergies  Allergen Reactions  . Compazine [Prochlorperazine] Other (See Comments)    Legs jumping   . Oxycodone Itching   No current facility-administered medications on file  prior to encounter.   Current Outpatient Medications on File Prior to Encounter  Medication Sig Dispense Refill  . ondansetron (ZOFRAN) 4 MG tablet Take 1 tablet (4 mg total) by mouth every 6 (six) hours. 12 tablet 0  . gabapentin (NEURONTIN) 300 MG capsule Take 1 capsule (300 mg total) by mouth 3 (three) times daily. 270 capsule 3  . HYDROcodone-acetaminophen (NORCO) 5-325 MG tablet Take 1 tablet by mouth every 6 (six) hours as needed for moderate pain. 6 tablet 0  . ibuprofen (ADVIL,MOTRIN) 800 MG tablet Take 1 tablet (800 mg total) by mouth every 8 (eight) hours as needed. 30 tablet 0  . levothyroxine (SYNTHROID, LEVOTHROID) 50 MCG tablet Take 50 mcg by mouth daily before breakfast.     . rOPINIRole (REQUIP) 3 MG tablet Take 3 mg by mouth at bedtime.    . [DISCONTINUED] ARIPiprazole (ABILIFY) 5 MG tablet Take 1 tablet (5 mg total) by mouth daily. 30 tablet 2   Social History   Socioeconomic History  . Marital status: Married    Spouse name: Elige Radon  . Number of children: 1  . Years of education: Some college  . Highest education level: Not on file  Occupational History  . Occupation: Network engineer  . Occupation: Private sitting  Tobacco Use  . Smoking status: Never Smoker  . Smokeless tobacco: Never Used  Vaping Use  . Vaping Use: Never used  Substance and Sexual Activity  . Alcohol use: Yes  Comment: Rarely  . Drug use: No  . Sexual activity: Yes    Birth control/protection: None  Other Topics Concern  . Not on file  Social History Narrative   Lives with husband   hhof 3 married non smoker     Daughter husband and pet dog   Caffeine use: Diet coke   Social Determinants of Health   Financial Resource Strain:   . Difficulty of Paying Living Expenses: Not on file  Food Insecurity:   . Worried About Programme researcher, broadcasting/film/video in the Last Year: Not on file  . Ran Out of Food in the Last Year: Not on file  Transportation Needs:   . Lack of Transportation (Medical): Not on  file  . Lack of Transportation (Non-Medical): Not on file  Physical Activity:   . Days of Exercise per Week: Not on file  . Minutes of Exercise per Session: Not on file  Stress:   . Feeling of Stress : Not on file  Social Connections:   . Frequency of Communication with Friends and Family: Not on file  . Frequency of Social Gatherings with Friends and Family: Not on file  . Attends Religious Services: Not on file  . Active Member of Clubs or Organizations: Not on file  . Attends Banker Meetings: Not on file  . Marital Status: Not on file  Intimate Partner Violence:   . Fear of Current or Ex-Partner: Not on file  . Emotionally Abused: Not on file  . Physically Abused: Not on file  . Sexually Abused: Not on file   Family History  Problem Relation Age of Onset  . Thyroid disease Mother   . Nephrolithiasis Mother   . Urolithiasis Mother   . Heart attack Mother        signs but neg eval  . Nephrolithiasis Father   . Urolithiasis Father   . Anxiety disorder Father   . Drug abuse Father   . Thyroid disease Sister   . Heart disease Maternal Grandmother     OBJECTIVE:  Vitals:   08/21/20 1755  BP: 111/76  Pulse: 80  Resp: 16  Temp: 98.1 F (36.7 C)  TempSrc: Oral  SpO2: 97%   General appearance: AOx3 in no acute distress HEENT: NCAT.  Oropharynx clear.  Lungs: clear to auscultation bilaterally without adventitious breath sounds Heart: regular rate and rhythm.  Radial pulses 2+ symmetrical bilaterally Abdomen: soft; non-distended; no tenderness; bowel sounds present; no guarding or rebound tenderness Back: no CVA tenderness Extremities: no edema; symmetrical with no gross deformities Skin: warm and dry GU: Cervical self swab was obtained Neurologic: Ambulates from chair to exam table without difficulty Psychological: alert and cooperative; normal mood and affect  Labs Reviewed  POCT URINALYSIS DIP (MANUAL ENTRY) - Abnormal; Notable for the following  components:      Result Value   Spec Grav, UA >=1.030 (*)    All other components within normal limits  URINE CULTURE  CERVICOVAGINAL ANCILLARY ONLY    ASSESSMENT & PLAN:  1. Dysuria   2. Vaginal discharge   3. Vaginal spotting     Meds ordered this encounter  Medications  . metroNIDAZOLE (FLAGYL) 500 MG tablet    Sig: Take 1 tablet (500 mg total) by mouth 2 (two) times daily.    Dispense:  14 tablet    Refill:  0  . fluconazole (DIFLUCAN) 150 MG tablet    Sig: Take 1 tablet (150 mg total) by mouth once for  1 dose.    Dispense:  2 tablet    Refill:  0   Discharge instructions  Urine culture sent.  We will call you with abnormal results.   Push fluids and get plenty of rest.   Take antibiotic as directed and to completion Metronidazole was prescribed for possible bacterial vaginosis Diflucan was prescribed to prevent yeast infection Avoid sexual activity and alcohol drinking until result become available Follow up with PCP if symptoms persists Return here or go to ER if you have any new or worsening symptoms such as fever, worsening abdominal pain, nausea/vomiting, flank pain, etc...  Outlined signs and symptoms indicating need for more acute intervention. Patient verbalized understanding. After Visit Summary given.     Durward Parcel, FNP 08/21/20 1827

## 2020-08-21 NOTE — Discharge Instructions (Signed)
Urine culture sent.  We will call you with abnormal results.   Push fluids and get plenty of rest.   Take antibiotic as directed and to completion Metronidazole was prescribed for possible bacterial vaginosis Diflucan was prescribed to prevent yeast infection Avoid sexual activity and alcohol drinking until result become available Follow up with PCP if symptoms persists Return here or go to ER if you have any new or worsening symptoms such as fever, worsening abdominal pain, nausea/vomiting, flank pain, etc..Marland Kitchen

## 2020-08-21 NOTE — ED Triage Notes (Signed)
Pt presents with c/o nausea and vomiting that began yesterday and also having vaginal spotting but has had hysterectomy also dysuria

## 2020-08-22 ENCOUNTER — Telehealth: Payer: Self-pay | Admitting: Emergency Medicine

## 2020-08-22 MED ORDER — METRONIDAZOLE 500 MG PO TABS: 500.0000 mg | ORAL_TABLET | Freq: Two times a day (BID) | ORAL | 0 refills | Status: DC

## 2020-08-22 NOTE — Telephone Encounter (Signed)
Pt rx did not go through to pharmacy

## 2020-08-23 LAB — URINE CULTURE: Culture: >=100000 — AB

## 2020-08-23 LAB — CERVICOVAGINAL ANCILLARY ONLY
Bacterial Vaginitis (gardnerella): POSITIVE — AB
Candida Glabrata: NEGATIVE
Candida Vaginitis: NEGATIVE
Chlamydia: NEGATIVE
Comment: NEGATIVE
Comment: NEGATIVE
Comment: NEGATIVE
Comment: NEGATIVE
Comment: NEGATIVE
Comment: NORMAL
Neisseria Gonorrhea: NEGATIVE
Trichomonas: NEGATIVE

## 2020-08-23 MED ORDER — NITROFURANTOIN MONOHYD MACRO 100 MG PO CAPS: 100.0000 mg | ORAL_CAPSULE | Freq: Two times a day (BID) | ORAL | 0 refills | Status: DC

## 2020-10-22 ENCOUNTER — Ambulatory Visit: Payer: Self-pay

## 2020-11-07 ENCOUNTER — Ambulatory Visit
Admission: EM | Admit: 2020-11-07 | Discharge: 2020-11-07 | Disposition: A | Discharge status: Home / Self Care | Payer: BC Managed Care – PPO | Attending: Family Medicine | Admitting: Family Medicine

## 2020-11-07 ENCOUNTER — Encounter: Payer: Self-pay | Admitting: Emergency Medicine

## 2020-11-07 ENCOUNTER — Other Ambulatory Visit: Payer: Self-pay

## 2020-11-07 ENCOUNTER — Ambulatory Visit: Payer: Self-pay

## 2020-11-07 DIAGNOSIS — H9222 Otorrhagia, left ear: Secondary | ICD-10-CM

## 2020-11-07 DIAGNOSIS — H7292 Unspecified perforation of tympanic membrane, left ear: Secondary | ICD-10-CM

## 2020-11-07 DIAGNOSIS — R059 Cough, unspecified: Secondary | ICD-10-CM

## 2020-11-07 DIAGNOSIS — J0140 Acute pansinusitis, unspecified: Secondary | ICD-10-CM

## 2020-11-07 DIAGNOSIS — J209 Acute bronchitis, unspecified: Secondary | ICD-10-CM

## 2020-11-07 MED ORDER — PROMETHAZINE-DM 6.25-15 MG/5ML PO SYRP: 5.0000 mL | ORAL_SOLUTION | Freq: Four times a day (QID) | ORAL | 0 refills | Status: DC | PRN

## 2020-11-07 MED ORDER — AMOXICILLIN-POT CLAVULANATE 875-125 MG PO TABS: 1.0000 | ORAL_TABLET | Freq: Two times a day (BID) | ORAL | 0 refills | Status: AC

## 2020-11-07 MED ORDER — DEXAMETHASONE SODIUM PHOSPHATE 10 MG/ML IJ SOLN
10.0000 mg | Freq: Once | INTRAMUSCULAR | Status: AC
  Administered 2020-11-07: 10 mg via INTRAMUSCULAR

## 2020-11-07 NOTE — ED Provider Notes (Signed)
[facility]   [id number] 11/07/20 Arrival Time: 1543  KG:URKY THROAT  SUBJECTIVE: History from: patient.  Erica Larson is a 44 y.o. female who presents with abrupt onset of nasal congestion, headache, cough, fatigue for the last 2 weeks. Reports that she was treated with a round of Keflex and that she does not feel that she has improved at all. Denies sick exposure to Covid, strep, flu or mono, or precipitating event. Has negative history of Covid. Has not had Covid vaccines. Also reports that her left ear has been painful and draining blood for the last day. Reports that previous to the blood, she had decreased hearing in that ear. There are no aggravating symptoms. Denies previous symptoms in the past.     Denies fever, chills, rhinorrhea, SOB, chest pain, nausea, rash, changes in bowel or bladder habits.     ROS: As per HPI.  All other pertinent ROS negative.     Past Medical History:  Diagnosis Date   Acne    Amenorrhea    Anxiety    Bipolar disorder (HCC)    type 2   Depression    Hypothyroidism    Infertility, female    treatment with oligammenorhea  Dr Chevis Pretty    Migraine    Restless legs syndrome    currently no problems per patient   SVD (spontaneous vaginal delivery)    x 1   Thyroiditis    Past Surgical History:  Procedure Laterality Date   CYSTOSCOPY  12/29/2017   Procedure: CYSTOSCOPY;  Surgeon: Ranae Pila, MD;  Location: [facility];  Service: Gynecology;;   DILATION AND CURETTAGE OF UTERUS     LAPAROSCOPIC VAGINAL HYSTERECTOMY WITH SALPINGECTOMY Bilateral 12/29/2017   Procedure: LAPAROSCOPIC ASSISTED VAGINAL HYSTERECTOMY WITH SALPINGECTOMY, mccalls culdoplasty;  Surgeon: Ranae Pila, MD;  Location: [facility];  Service: Gynecology;  Laterality: Bilateral;   WISDOM TOOTH EXTRACTION     Allergies  Allergen Reactions   Compazine [Prochlorperazine] Other (See Comments)    Legs jumping    Oxycodone Itching   No  current facility-administered medications on file prior to encounter.   Current Outpatient Medications on File Prior to Encounter  Medication Sig Dispense Refill   gabapentin (NEURONTIN) 300 MG capsule Take 1 capsule (300 mg total) by mouth 3 (three) times daily. 270 capsule 3   HYDROcodone-acetaminophen (NORCO) 5-325 MG tablet Take 1 tablet by mouth every 6 (six) hours as needed for moderate pain. 6 tablet 0   ibuprofen (ADVIL,MOTRIN) 800 MG tablet Take 1 tablet (800 mg total) by mouth every 8 (eight) hours as needed. 30 tablet 0   levothyroxine (SYNTHROID, LEVOTHROID) 50 MCG tablet Take 50 mcg by mouth daily before breakfast.      nitrofurantoin, macrocrystal-monohydrate, (MACROBID) 100 MG capsule Take 1 capsule (100 mg total) by mouth 2 (two) times daily. 10 capsule 0   ondansetron (ZOFRAN) 4 MG tablet Take 1 tablet (4 mg total) by mouth every 6 (six) hours. 12 tablet 0   rOPINIRole (REQUIP) 3 MG tablet Take 3 mg by mouth at bedtime.     [DISCONTINUED] ARIPiprazole (ABILIFY) 5 MG tablet Take 1 tablet (5 mg total) by mouth daily. 30 tablet 2   Social History   Socioeconomic History   Marital status: Married    Spouse name: Elige Radon   Number of children: 1   Years of education: Some college   Highest education level: Not on file  Occupational History   Occupation: Network engineer   Occupation:  Private sitting  Tobacco Use   Smoking status: Never Smoker   Smokeless tobacco: Never Used  Vaping Use   Vaping Use: Never used  Substance and Sexual Activity   Alcohol use: Yes    Comment: Rarely   Drug use: No   Sexual activity: Yes    Birth control/protection: None  Other Topics Concern   Not on file  Social History Narrative   Lives with husband   hhof 3 married non smoker     Daughter husband and pet dog   Caffeine use: Diet coke   Social Determinants of Corporate investment banker Strain: Not on file  Food Insecurity: Not on file  Transportation Needs:  Not on file  Physical Activity: Not on file  Stress: Not on file  Social Connections: Not on file  Intimate Partner Violence: Not on file   Family History  Problem Relation Age of Onset   Thyroid disease Mother    Nephrolithiasis Mother    Urolithiasis Mother    Heart attack Mother        signs but neg eval   Nephrolithiasis Father    Urolithiasis Father    Anxiety disorder Father    Drug abuse Father    Thyroid disease Sister    Heart disease Maternal Grandmother     OBJECTIVE:  Vitals:   11/07/20 1613  BP: 134/75  Pulse: 81  Resp: 16  Temp: 98.5 F (36.9 C)  TempSrc: Oral  SpO2: 97%     General appearance: alert; appears fatigued, but nontoxic, speaking in full sentences and managing own secretions HEENT: NCAT; Ears: R EAC clear, L EAC with bloody fluid; R TMs pearly gray with visible cone of light, without erythema; L TM perforated and draining bloody fluid Eyes: PERRL, EOMI grossly; Nose: no obvious rhinorrhea; Throat: oropharynx clear, tonsils 1+ and mildly erythematous without white tonsillar exudates, uvula midline; Sinuses: frontal and maxillary sinuses tender to palpation Neck: supple without LAD Lungs: CTA bilaterally without adventitious breath sounds; cough absent Heart: regular rate and rhythm.  Radial pulses 2+ symmetrical bilaterally Skin: warm and dry Psychological: alert and cooperative; normal mood and affect  LABS: No results found for this or any previous visit (from the past 24 hour(s)).   ASSESSMENT & PLAN:  1. Acute pansinusitis, recurrence not specified   2. Cough   3. Bleeding from left ear   4. Perforation of left tympanic membrane   5. Acute bronchitis, unspecified organism     Meds ordered this encounter  Medications   promethazine-dextromethorphan (PROMETHAZINE-DM) 6.25-15 MG/5ML syrup    Sig: Take 5 mLs by mouth 4 (four) times daily as needed for cough.    Dispense:  118 mL    Refill:  0    Order Specific Question:    Supervising Provider    Answer:   Merrilee Jansky [id number]   amoxicillin-clavulanate (AUGMENTIN) 875-125 MG tablet    Sig: Take 1 tablet by mouth 2 (two) times daily for 10 days.    Dispense:  20 tablet    Refill:  0    Order Specific Question:   Supervising Provider    Answer:   Merrilee Jansky [[id number]]   dexamethasone (DECADRON) injection 10 mg   Decadron 10mg  IM in office today Acute Sinusitis Push fluids and get rest Prescribed Augmentin 875mg  twice daily for 10 days.   Prescribed promethazine syrup for cough Sedation precautions given Take as directed and to completion.  Drink warm or cool liquids,  use throat lozenges, or popsicles to help alleviate symptoms If L ear is still painful after illness, follow up with ENT Take OTC ibuprofen or tylenol as needed for pain May use Zyrtec D and flonase to help alleviate symptoms Follow up with PCP if symptoms persist Return or go to ER if you have any new or worsening symptoms such as fever, chills, nausea, vomiting, worsening sore throat, cough, abdominal pain, chest pain, changes in bowel or bladder habits.   Reviewed expectations re: course of current medical issues. Questions answered. Outlined signs and symptoms indicating need for more acute intervention. Patient verbalized understanding. After Visit Summary given.          Moshe Cipro, NP 11/07/20 1659

## 2020-11-07 NOTE — ED Triage Notes (Signed)
Lt ear drum bleeding that started today

## 2020-11-07 NOTE — Discharge Instructions (Signed)
You have received a steroid injection in the office today  I have sent in Augmentin for you to take twice a day for 10 days.  I have sent in some cough syrup for you to take as well. This medication can make you sleepy.  Follow up with this office or with primary care if symptoms are persisting.  Follow up in the ER for high fever, trouble swallowing, trouble breathing, other concerning symptoms.

## 2020-11-25 ENCOUNTER — Other Ambulatory Visit: Payer: Self-pay

## 2020-11-25 ENCOUNTER — Ambulatory Visit
Admission: RE | Admit: 2020-11-25 | Discharge: 2020-11-25 | Disposition: A | Discharge status: Home / Self Care | Payer: Self-pay | Source: Ambulatory Visit | Attending: Family Medicine | Admitting: Family Medicine

## 2020-11-25 VITALS — BP 125/83 | HR 80 | Temp 97.7°F | Resp 22 | Ht 66.0 in | Wt 155.0 lb

## 2020-11-25 DIAGNOSIS — R0682 Tachypnea, not elsewhere classified: Secondary | ICD-10-CM

## 2020-11-25 DIAGNOSIS — R0789 Other chest pain: Secondary | ICD-10-CM

## 2020-11-25 DIAGNOSIS — R059 Cough, unspecified: Secondary | ICD-10-CM

## 2020-11-25 DIAGNOSIS — J069 Acute upper respiratory infection, unspecified: Secondary | ICD-10-CM

## 2020-11-25 MED ORDER — CHERATUSSIN AC 100-10 MG/5ML PO SOLN: 5.0000 mL | Freq: Three times a day (TID) | ORAL | 0 refills | Status: DC | PRN

## 2020-11-25 MED ORDER — FLUCONAZOLE 150 MG PO TABS: ORAL_TABLET | ORAL | 0 refills | Status: DC

## 2020-11-25 MED ORDER — PREDNISONE 10 MG (21) PO TBPK: ORAL_TABLET | Freq: Every day | ORAL | 0 refills | Status: AC

## 2020-11-25 MED ORDER — LEVOFLOXACIN 500 MG PO TABS: 500.0000 mg | ORAL_TABLET | Freq: Every day | ORAL | 0 refills | Status: DC

## 2020-11-25 NOTE — ED Triage Notes (Signed)
Patient states she was seen 2 weeks for same symptoms.  Sore throat is new since Tuesday.  Also c/o headache

## 2020-11-25 NOTE — Discharge Instructions (Addendum)
I have sent in a prednisone taper for you to take for 6 days. 6 tablets on day one, 5 tablets on day two, 4 tablets on day three, 3 tablets on day four, 2 tablets on day five, and 1 tablet on day six.  I have sent in Levaquin for you to take once daily for 7 days  I have sent in fluconazole in case of yeast. Take one tablet at the onset of symptoms. If symptoms are still present in 3 days, take the second tablet.   Follow up with this office or with primary care if symptoms are persisting.  Follow up in the ER for high fever, trouble swallowing, trouble breathing, other concerning symptoms.

## 2020-11-27 NOTE — ED Provider Notes (Signed)
[facility]   [id number] 11/25/20 Arrival Time: 1058  MW:NUUV THROAT  SUBJECTIVE: History from: patient.  Erica Larson is a 45 y.o. female who presents with abrupt onset of nasal congestion, headache, cough, fatigue for the last week. Was seen in this office on 11/07/20 and treated with steroid injection and Augmentin. States that she did feel better with the medication but symptoms have returned. Reports that her husband is sick at home as well. Has negative history of Covid. Has not had Covid vaccines. Also reports decreased hearing in the L ear. At last visit,  the L TM was found to be perforated and draining blood. Denies ear drainage since then. Coughing and SOB are aggravated by conversation and activity. Reports previous symptoms in the past.     Denies fever, chills, rhinorrhea, nausea, rash, changes in bowel or bladder habits.     ROS: As per HPI.  All other pertinent ROS negative.     Past Medical History:  Diagnosis Date  . Acne   . Amenorrhea   . Anxiety   . Bipolar disorder (HCC)    type 2  . Depression   . Hypothyroidism   . Infertility, female    treatment with oligammenorhea  Dr Chevis Pretty   . Migraine   . Restless legs syndrome    currently no problems per patient  . SVD (spontaneous vaginal delivery)    x 1  . Thyroiditis    Past Surgical History:  Procedure Laterality Date  . CYSTOSCOPY  12/29/2017   Procedure: CYSTOSCOPY;  Surgeon: Ranae Pila, MD;  Location: [facility];  Service: Gynecology;;  . DILATION AND CURETTAGE OF UTERUS    . LAPAROSCOPIC VAGINAL HYSTERECTOMY WITH SALPINGECTOMY Bilateral 12/29/2017   Procedure: LAPAROSCOPIC ASSISTED VAGINAL HYSTERECTOMY WITH SALPINGECTOMY, mccalls culdoplasty;  Surgeon: Ranae Pila, MD;  Location: [facility];  Service: Gynecology;  Laterality: Bilateral;  . WISDOM TOOTH EXTRACTION     Allergies  Allergen Reactions  . Compazine [Prochlorperazine] Other (See Comments)    Legs jumping   .  Oxycodone Itching   No current facility-administered medications on file prior to encounter.   Current Outpatient Medications on File Prior to Encounter  Medication Sig Dispense Refill  . gabapentin (NEURONTIN) 300 MG capsule Take 1 capsule (300 mg total) by mouth 3 (three) times daily. 270 capsule 3  . HYDROcodone-acetaminophen (NORCO) 5-325 MG tablet Take 1 tablet by mouth every 6 (six) hours as needed for moderate pain. 6 tablet 0  . ibuprofen (ADVIL,MOTRIN) 800 MG tablet Take 1 tablet (800 mg total) by mouth every 8 (eight) hours as needed. 30 tablet 0  . levothyroxine (SYNTHROID, LEVOTHROID) 50 MCG tablet Take 50 mcg by mouth daily before breakfast.     . nitrofurantoin, macrocrystal-monohydrate, (MACROBID) 100 MG capsule Take 1 capsule (100 mg total) by mouth 2 (two) times daily. 10 capsule 0  . ondansetron (ZOFRAN) 4 MG tablet Take 1 tablet (4 mg total) by mouth every 6 (six) hours. 12 tablet 0  . rOPINIRole (REQUIP) 3 MG tablet Take 3 mg by mouth at bedtime.    . [DISCONTINUED] ARIPiprazole (ABILIFY) 5 MG tablet Take 1 tablet (5 mg total) by mouth daily. 30 tablet 2   Social History   Socioeconomic History  . Marital status: Married    Spouse name: Elige Radon  . Number of children: 1  . Years of education: Some college  . Highest education level: Not on file  Occupational History  . Occupation: Network engineer  .  Occupation: Private sitting  Tobacco Use  . Smoking status: Never Smoker  . Smokeless tobacco: Never Used  Vaping Use  . Vaping Use: Never used  Substance and Sexual Activity  . Alcohol use: Yes    Comment: Rarely  . Drug use: No  . Sexual activity: Yes    Birth control/protection: None  Other Topics Concern  . Not on file  Social History Narrative   Lives with husband   hhof 3 married non smoker     Daughter husband and pet dog   Caffeine use: Diet coke   Social Determinants of Corporate investment banker Strain: Not on file  Food Insecurity: Not on file   Transportation Needs: Not on file  Physical Activity: Not on file  Stress: Not on file  Social Connections: Not on file  Intimate Partner Violence: Not on file   Family History  Problem Relation Age of Onset  . Thyroid disease Mother   . Nephrolithiasis Mother   . Urolithiasis Mother   . Heart attack Mother        signs but neg eval  . Nephrolithiasis Father   . Urolithiasis Father   . Anxiety disorder Father   . Drug abuse Father   . Thyroid disease Sister   . Heart disease Maternal Grandmother     OBJECTIVE:  Vitals:   11/25/20 1106 11/25/20 1107  BP: 125/83   Pulse: 80   Resp: (!) 22   Temp: 97.7 F (36.5 C)   TempSrc: Oral   SpO2: 99%   Weight:  155 lb (70.3 kg)  Height:  5\' 6"  (1.676 m)     General appearance: alert; appears fatigued, but nontoxic, speaking in full sentences and managing own secretions HEENT: NCAT; Ears: EACs clear;  TMs pearly gray with visible cone of light, without erythema; Eyes: PERRL, EOMI grossly; Nose: no obvious rhinorrhea; Throat: oropharynx erythematous, cobblestoning present,  tonsils 1+ and mildly erythematous without white tonsillar exudates, uvula midline; Sinuses: frontal and maxillary sinuses tender to palpation Neck: supple with LAD Lungs: CTA bilaterally without adventitious breath sounds; cough moderate Heart: regular rate and rhythm.  Radial pulses 2+ symmetrical bilaterally Skin: warm and dry Psychological: alert and cooperative; normal mood and affect  LABS: No results found for this or any previous visit (from the past 24 hour(s)).     URI Cough Chest Discomfort Tachypnea  Prescribed steroid taper Prescribed Cheratussin cough syrup Sedation precautions given Prescribed Levaquin 500mg  once daily x 7 days Prescribed fluconazole in case of yeast Work note provided Covid and flu swab obtained in office today.   Patient instructed to quarantine until results are back and negative.   If results are negative,  patient may resume daily schedule as tolerated once they are fever free for 24 hours without the use of antipyretic medications.   If results are positive, patient instructed to quarantine for at least 5 days from symptom onset.  If after 5 days symptoms have resolved, may return to work with a well fitting mask for the next 5 days. If symptomatic after day 5, isolation should be extended to 10 days. Patient instructed to follow-up with primary care or with this office as needed.   Patient instructed to follow-up in the ER for trouble swallowing, trouble breathing, other concerning symptoms.   Final Clinical Impressions(s) / UC Diagnoses   Final diagnoses:  Upper respiratory tract infection, unspecified type  Cough  Chest discomfort  Tachypnea     Discharge Instructions  I have sent in a prednisone taper for you to take for 6 days. 6 tablets on day one, 5 tablets on day two, 4 tablets on day three, 3 tablets on day four, 2 tablets on day five, and 1 tablet on day six.  I have sent in Levaquin for you to take once daily for 7 days  I have sent in fluconazole in case of yeast. Take one tablet at the onset of symptoms. If symptoms are still present in 3 days, take the second tablet.   Follow up with this office or with primary care if symptoms are persisting.  Follow up in the ER for high fever, trouble swallowing, trouble breathing, other concerning symptoms.      ED Prescriptions    Medication Sig Dispense Auth. Provider   predniSONE (STERAPRED UNI-PAK 21 TAB) 10 MG (21) TBPK tablet Take by mouth daily for 6 days. Take 6 tablets on day 1, 5 tablets on day 2, 4 tablets on day 3, 3 tablets on day 4, 2 tablets on day 5, 1 tablet on day 6 21 tablet Moshe Cipro, NP   levofloxacin (LEVAQUIN) 500 MG tablet Take 1 tablet (500 mg total) by mouth daily. 7 tablet Moshe Cipro, NP   guaiFENesin-codeine (CHERATUSSIN AC) 100-10 MG/5ML syrup Take 5 mLs by mouth 3 (three) times  daily as needed for cough. 120 mL Moshe Cipro, NP   fluconazole (DIFLUCAN) 150 MG tablet Take one tablet at the onset of symptoms. If symptoms are still present 3 days later, take the second tablet. 2 tablet Moshe Cipro, NP     I have reviewed the PDMP during this encounter.   Moshe Cipro, NP 11/27/20 1127

## 2020-11-28 LAB — COVID-19, FLU A+B NAA
Influenza A, NAA: NOT DETECTED
Influenza B, NAA: NOT DETECTED
SARS-CoV-2, NAA: NOT DETECTED

## 2021-06-03 ENCOUNTER — Encounter: Payer: Self-pay | Admitting: Emergency Medicine

## 2021-06-03 ENCOUNTER — Other Ambulatory Visit: Payer: Self-pay

## 2021-06-03 ENCOUNTER — Ambulatory Visit
Admission: EM | Admit: 2021-06-03 | Discharge: 2021-06-03 | Disposition: A | Discharge status: Home / Self Care | Payer: Self-pay | Attending: Family Medicine | Admitting: Family Medicine

## 2021-06-03 DIAGNOSIS — N390 Urinary tract infection, site not specified: Secondary | ICD-10-CM | POA: Insufficient documentation

## 2021-06-03 LAB — POCT URINALYSIS DIP (DEVICE)
Bilirubin Urine: NEGATIVE
Glucose, UA: NEGATIVE mg/dL
Ketones, ur: NEGATIVE mg/dL
Nitrite: NEGATIVE
Protein, ur: NEGATIVE mg/dL
Specific Gravity, Urine: 1.025 (ref 1.005–1.030)
Urobilinogen, UA: 0.2 mg/dL (ref 0.0–1.0)
pH: 5.5 (ref 5.0–8.0)

## 2021-06-03 MED ORDER — NITROFURANTOIN MONOHYD MACRO 100 MG PO CAPS
100.0000 mg | ORAL_CAPSULE | Freq: Two times a day (BID) | ORAL | 0 refills | Status: DC
Start: 2021-06-03 — End: 2021-10-10

## 2021-06-03 MED ORDER — FLUCONAZOLE 200 MG PO TABS
200.0000 mg | ORAL_TABLET | Freq: Every day | ORAL | 1 refills | Status: AC
Start: 2021-06-03 — End: 2021-06-05

## 2021-06-03 MED ORDER — PHENAZOPYRIDINE HCL 200 MG PO TABS
200.0000 mg | ORAL_TABLET | Freq: Three times a day (TID) | ORAL | 0 refills | Status: DC
Start: 2021-06-03 — End: 2021-10-10

## 2021-06-03 NOTE — Discharge Instructions (Addendum)
Take the Macrobid twice daily for 5 days with food for treatment of urinary tract infection.  Use the Pyridium every 8 hours as needed for urinary discomfort.  This will turn your urine a bright red-orange.  Increase your oral fluid intake so that you increase your urine production and or flushing your urinary system.  Take an over-the-counter probiotic, such as Culturelle-Align-Activia, 1 hour after each dose of antibiotic to prevent diarrhea or yeast infections from forming.  We will culture urine and change the antibiotics if necessary.  If you develop symptoms of a yeast infection start taking the Diflucan and you can repeat in 1 week if your symptoms are not resolved.   Return for reevaluation, or see your primary care provider, for any new or worsening symptoms.

## 2021-06-03 NOTE — ED Triage Notes (Signed)
Patient c/o burning when urinating, lower pelvic pressure and lower back pain that started this morning.

## 2021-06-03 NOTE — ED Provider Notes (Signed)
[facility]    CSN: [id number] Arrival date & time: 06/03/21  1326      History   Chief Complaint Chief Complaint  Patient presents with   Dysuria   Back Pain    HPI Erica Larson is a 45 y.o. female.   HPI  45 year old female here for evaluation of urinary complaints.  Patient reports that she developed burning with urination, lower pelvic pressure, and low back pain this morning.  She reports that she was recently at her mother's and used a new soap which typically triggers UTIs for her.  She also recently was in a hot tub.  She states that she has urinary urgency and frequency.  She denies fever, blood in her urine, nausea, or vomiting.  Past Medical History:  Diagnosis Date   Acne    Amenorrhea    Anxiety    Bipolar disorder (HCC)    type 2   Depression    Hypothyroidism    Infertility, female    treatment with oligammenorhea  Dr Chevis Pretty    Migraine    Restless legs syndrome    currently no problems per patient   SVD (spontaneous vaginal delivery)    x 1   Thyroiditis     Patient Active Problem List   Diagnosis Date Noted   S/P laparoscopic assisted vaginal hysterectomy (LAVH) 12/29/2017   Restless leg syndrome 11/05/2015   Bipolar 2 disorder, major depressive episode (HCC) 11/04/2015   Anxiety state, unspecified 09/27/2013   Fatigue 01/02/2011   VITAMIN D DEFICIENCY 01/05/2009   Hypercalcemia 01/05/2009   NUMBNESS 01/05/2009   THYROIDITIS 01/02/2009   PALPITATIONS, RECURRENT 01/02/2009   SINUSITIS- ACUTE-NOS 08/15/2008   FEMALE INFERTILITY 07/18/2008   WEIGHT GAIN 07/18/2008   ACUTE TONSILLITIS 12/30/2007   URI 12/30/2007   MIGRAINE HEADACHE 08/12/2007   AMENORRHEA 08/12/2007    Past Surgical History:  Procedure Laterality Date   CYSTOSCOPY  12/29/2017   Procedure: CYSTOSCOPY;  Surgeon: Ranae Pila, MD;  Location: [facility];  Service: Gynecology;;   DILATION AND CURETTAGE OF UTERUS     LAPAROSCOPIC VAGINAL HYSTERECTOMY WITH  SALPINGECTOMY Bilateral 12/29/2017   Procedure: LAPAROSCOPIC ASSISTED VAGINAL HYSTERECTOMY WITH SALPINGECTOMY, mccalls culdoplasty;  Surgeon: Ranae Pila, MD;  Location: [facility];  Service: Gynecology;  Laterality: Bilateral;   WISDOM TOOTH EXTRACTION      OB History     Gravida  3   Para  1   Term      Preterm  1   AB  2   Living  1      SAB      IAB      Ectopic  2   Multiple      Live Births               Home Medications    Prior to Admission medications   Medication Sig Start Date End Date Taking? Authorizing Provider  fluconazole (DIFLUCAN) 200 MG tablet Take 1 tablet (200 mg total) by mouth daily for 2 doses. 06/03/21 06/05/21 Yes Becky Augusta, NP  gabapentin (NEURONTIN) 300 MG capsule Take 1 capsule (300 mg total) by mouth 3 (three) times daily. 12/06/19  Yes Lomax, Amy, NP  levothyroxine (SYNTHROID, LEVOTHROID) 50 MCG tablet Take 50 mcg by mouth daily before breakfast.  09/10/13  Yes [provider]  nitrofurantoin, macrocrystal-monohydrate, (MACROBID) 100 MG capsule Take 1 capsule (100 mg total) by mouth 2 (two) times daily. 06/03/21  Yes Becky Augusta, NP  phenazopyridine (PYRIDIUM) 200 MG tablet Take 1 tablet (200 mg total) by mouth 3 (three) times daily. 06/03/21  Yes Becky Augusta, NP  rOPINIRole (REQUIP) 3 MG tablet Take 3 mg by mouth at bedtime.   Yes [provider]  ibuprofen (ADVIL,MOTRIN) 800 MG tablet Take 1 tablet (800 mg total) by mouth every 8 (eight) hours as needed. 12/31/17   Ranae Pila, MD  ondansetron (ZOFRAN) 4 MG tablet Take 1 tablet (4 mg total) by mouth every 6 (six) hours. 02/24/20   Wurst, [place], PA-C  ARIPiprazole (ABILIFY) 5 MG tablet Take 1 tablet (5 mg total) by mouth daily. 10/29/17 12/06/19  Benjaman Pott, MD    Family History Family History  Problem Relation Age of Onset   Thyroid disease Mother    Nephrolithiasis Mother    Urolithiasis Mother    Heart attack Mother        signs but neg  eval   Nephrolithiasis Father    Urolithiasis Father    Anxiety disorder Father    Drug abuse Father    Thyroid disease Sister    Heart disease Maternal Grandmother     Social History Social History   Tobacco Use   Smoking status: Never   Smokeless tobacco: Never  Vaping Use   Vaping Use: Never used  Substance Use Topics   Alcohol use: Yes    Comment: Rarely   Drug use: No     Allergies   Compazine [prochlorperazine] and Oxycodone   Review of Systems Review of Systems  Constitutional:  Negative for activity change, appetite change and fever.  Gastrointestinal:  Positive for abdominal pain. Negative for nausea and vomiting.  Genitourinary:  Positive for dysuria, frequency and urgency. Negative for hematuria.  Musculoskeletal:  Positive for back pain.  Hematological: Negative.   Psychiatric/Behavioral: Negative.      Physical Exam Triage Vital Signs ED Triage Vitals  Enc Vitals Group     BP 06/03/21 1337 123/85     Pulse Rate 06/03/21 1337 76     Resp 06/03/21 1337 14     Temp 06/03/21 1337 98.2 F (36.8 C)     Temp Source 06/03/21 1337 Oral     SpO2 06/03/21 1337 99 %     Weight 06/03/21 1334 154 lb 15.7 oz (70.3 kg)     Height 06/03/21 1334 5\' 6"  (1.676 m)     Head Circumference --      Peak Flow --      Pain Score 06/03/21 1334 3     Pain Loc --      Pain Edu? --      Excl. in GC? --    No data found.  Updated Vital Signs BP 123/85 (BP Location: Left Arm)   Pulse 76   Temp 98.2 F (36.8 C) (Oral)   Resp 14   Ht 5\' 6"  (1.676 m)   Wt 154 lb 15.7 oz (70.3 kg)   LMP  (LMP Unknown)   SpO2 99%   BMI 25.01 kg/m   Visual Acuity Right Eye Distance:   Left Eye Distance:   Bilateral Distance:    Right Eye Near:   Left Eye Near:    Bilateral Near:     Physical Exam Vitals and nursing note reviewed.  Constitutional:      General: She is not in acute distress.    Appearance: Normal appearance. She is normal weight. She is not ill-appearing.   HENT:     Head: Normocephalic  and atraumatic.  Cardiovascular:     Rate and Rhythm: Normal rate and regular rhythm.     Pulses: Normal pulses.     Heart sounds: Normal heart sounds. No murmur heard.   No gallop.  Pulmonary:     Effort: Pulmonary effort is normal.     Breath sounds: Normal breath sounds. No wheezing, rhonchi or rales.  Abdominal:     Palpations: Abdomen is soft.     Tenderness: There is no abdominal tenderness. There is no right CVA tenderness or left CVA tenderness.  Skin:    General: Skin is warm and dry.     Capillary Refill: Capillary refill takes less than 2 seconds.     Findings: No erythema or rash.  Neurological:     General: No focal deficit present.     Mental Status: She is alert and oriented to person, place, and time.  Psychiatric:        Mood and Affect: Mood normal.        Behavior: Behavior normal.        Thought Content: Thought content normal.        Judgment: Judgment normal.     UC Treatments / Results  Labs (all labs ordered are listed, but only abnormal results are displayed) Labs Reviewed  POCT URINALYSIS DIP (DEVICE) - Abnormal; Notable for the following components:      Result Value   Hgb urine dipstick SMALL (*)    Leukocytes,Ua SMALL (*)    All other components within normal limits  URINE CULTURE  POCT URINALYSIS DIPSTICK, ED / UC    EKG   Radiology No results found.  Procedures Procedures (including critical care time)  Medications Ordered in UC Medications - No data to display  Initial Impression / Assessment and Plan / UC Course  I have reviewed the triage vital signs and the nursing notes.  Pertinent labs & imaging results that were available during my care of the patient were reviewed by me and considered in my medical decision making (see chart for details).  Patient is a very pleasant and nontoxic-appearing 45 year old female here for evaluation of urinary complaints as outlined in the HPI above.  Patient's  physical exam reveals a benign cardiopulmonary exam.  Abdomen is soft and nontender.  No CVA tenderness on exam.  Urinalysis collected at triage.  UA shows small leukocytes and small hemoglobin.  Nitrite and protein negative.  Will send urine for culture.  Will treat patient for UTI with Macrobid twice daily for 5 days and Pyridium to with urinary symptoms.  Patient reports that she is also concerned about developing a yeast infection following the body soap change in recent hot tub use.  Will give a prescription for Diflucan for empiric treatment.   Final Clinical Impressions(s) / UC Diagnoses   Final diagnoses:  Lower urinary tract infectious disease     Discharge Instructions      Take the Macrobid twice daily for 5 days with food for treatment of urinary tract infection.  Use the Pyridium every 8 hours as needed for urinary discomfort.  This will turn your urine a bright red-orange.  Increase your oral fluid intake so that you increase your urine production and or flushing your urinary system.  Take an over-the-counter probiotic, such as Culturelle-Align-Activia, 1 hour after each dose of antibiotic to prevent diarrhea or yeast infections from forming.  We will culture urine and change the antibiotics if necessary.  If you develop symptoms of a  yeast infection start taking the Diflucan and you can repeat in 1 week if your symptoms are not resolved.   Return for reevaluation, or see your primary care provider, for any new or worsening symptoms.      ED Prescriptions     Medication Sig Dispense Auth. Provider   nitrofurantoin, macrocrystal-monohydrate, (MACROBID) 100 MG capsule Take 1 capsule (100 mg total) by mouth 2 (two) times daily. 10 capsule Becky Augusta, NP   phenazopyridine (PYRIDIUM) 200 MG tablet Take 1 tablet (200 mg total) by mouth 3 (three) times daily. 6 tablet Becky Augusta, NP   fluconazole (DIFLUCAN) 200 MG tablet Take 1 tablet (200 mg total) by mouth daily  for 2 doses. 2 tablet Becky Augusta, NP      PDMP not reviewed this encounter.   Becky Augusta, NP 06/03/21 1404

## 2021-06-04 ENCOUNTER — Other Ambulatory Visit
Admission: RE | Admit: 2021-06-04 | Discharge: 2021-06-04 | Disposition: A | Discharge status: Home / Self Care | Payer: Self-pay | Source: Ambulatory Visit | Attending: Family Medicine | Admitting: Family Medicine

## 2021-10-01 IMAGING — CT CT MAXILLOFACIAL W/O CM
3 series · 15 of 47 positions shown, 18 images · non-contrast
Comparison: None.

CLINICAL DATA: Fell down 6 steps, loss of consciousness, head pain,
right orbital bruising, neck pain

EXAM:
CT HEAD WITHOUT CONTRAST
CT MAXILLOFACIAL WITHOUT CONTRAST
CT CERVICAL SPINE WITHOUT CONTRAST
TECHNIQUE: Multidetector CT imaging of the head, cervical spine, and
maxillofacial structures were performed using the standard protocol
without intravenous contrast. Multiplanar CT image reconstructions
of the cervical spine and maxillofacial structures were also
generated.

[Series 2: max soft · axial · 0.33mm/px · z∈[+352,+482]mm · 9 of 77 slices shown, 12 images]
[im 6/77  brain]
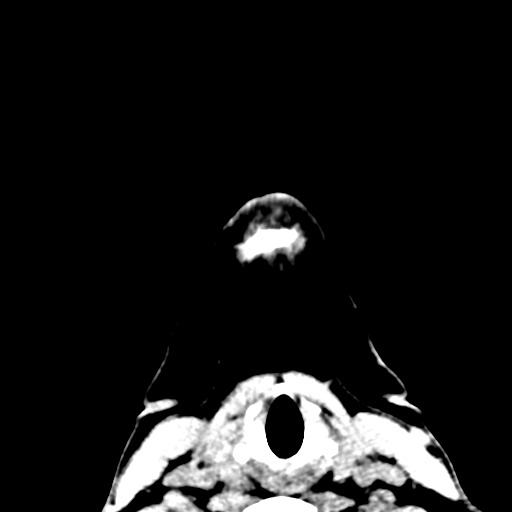
[im 6/77  bone]
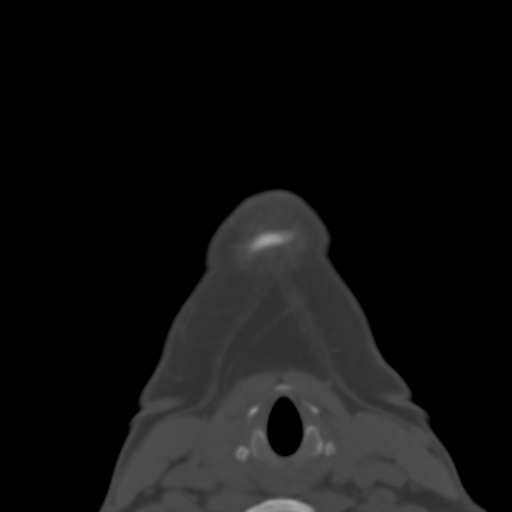
[im 14/77  bone]
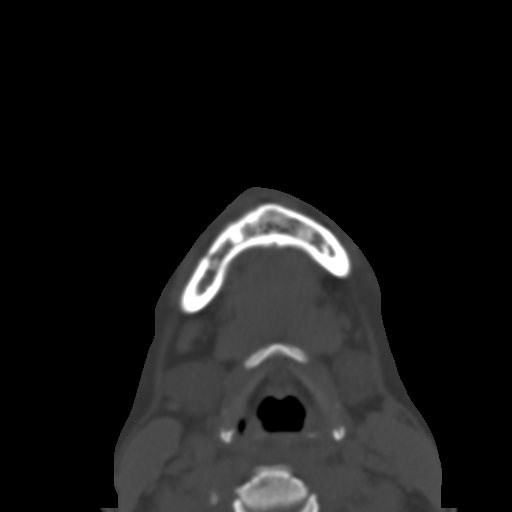
[im 21/77  bone]
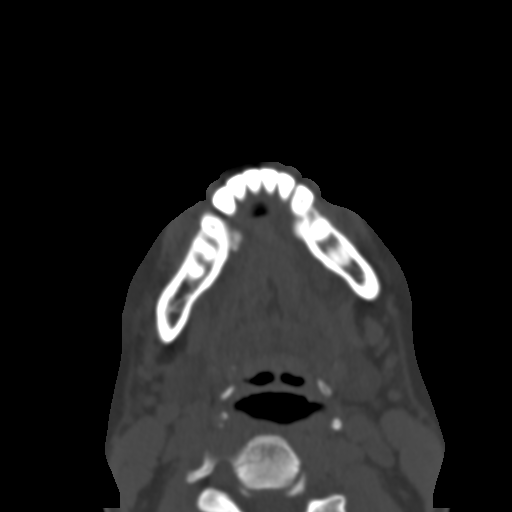
[im 29/77  bone]
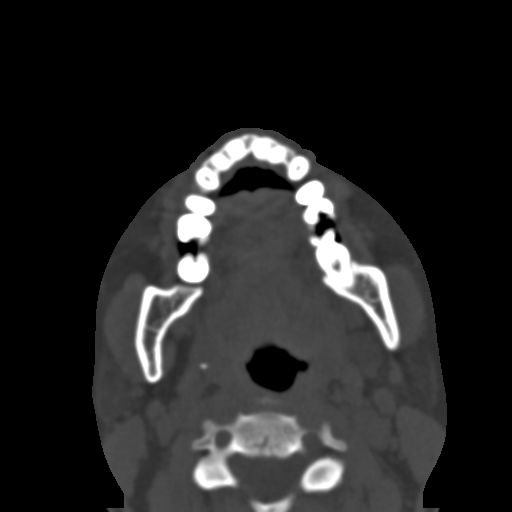
[im 40/77  brain]
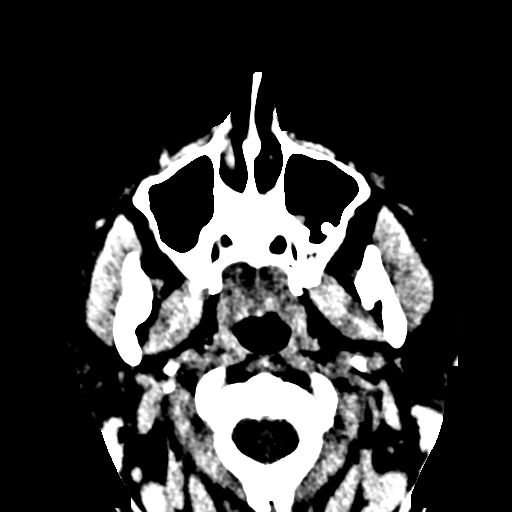
[im 40/77  bone]
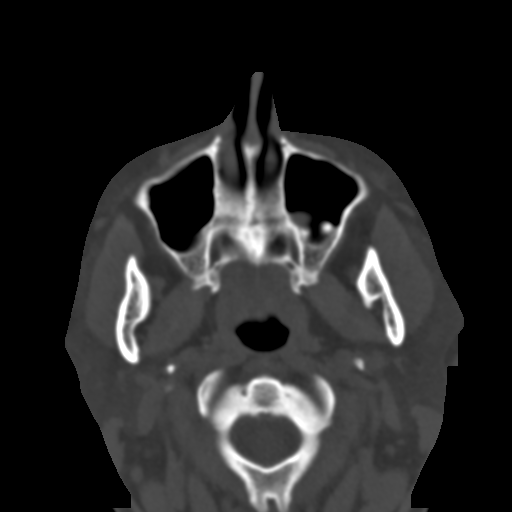
[im 48/77  bone]
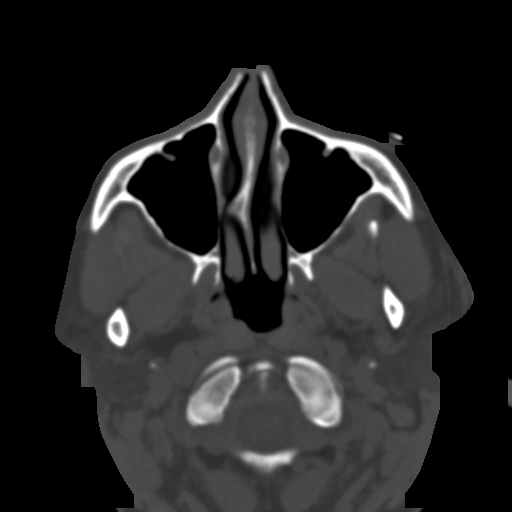
[im 56/77  bone]
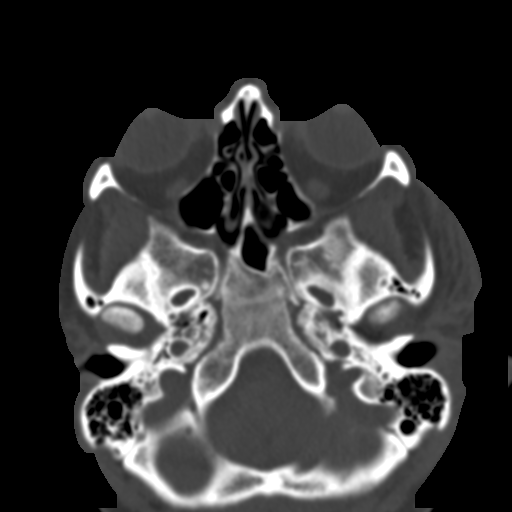
[im 63/77  bone]
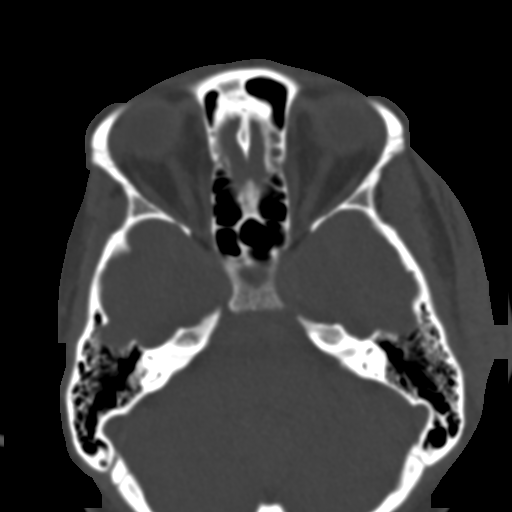
[im 71/77  brain]
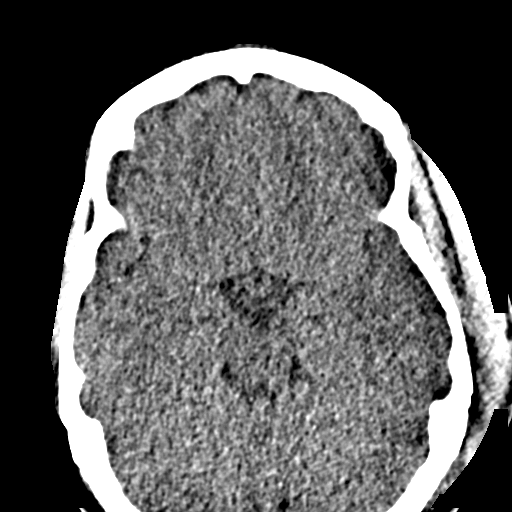
[im 71/77  bone]
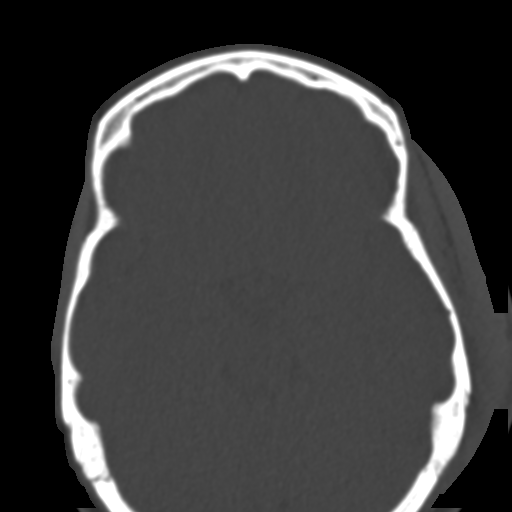

[Series 6: coronal soft · coronal · 0.34mm/px · 3 of 74 slices shown]
[im 25/74  bone]
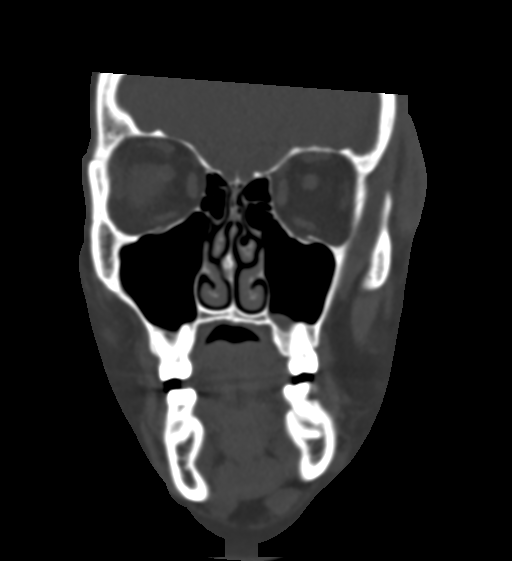
[im 33/74  bone]
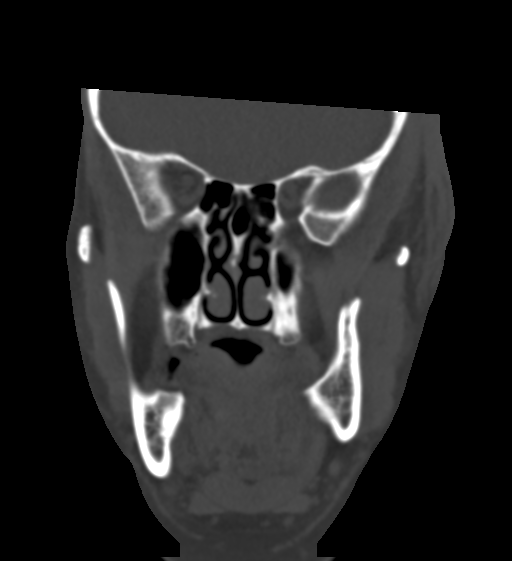
[im 41/74  bone]
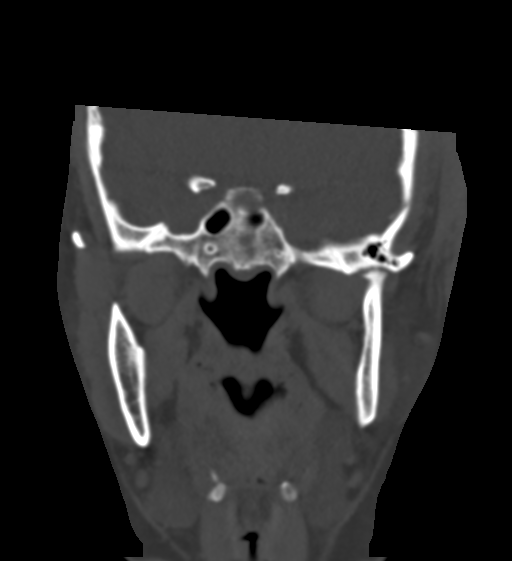

[Series 7: sagittal soft · sagittal · 0.32mm/px · 3 of 70 slices shown]
[im 24/70  bone]
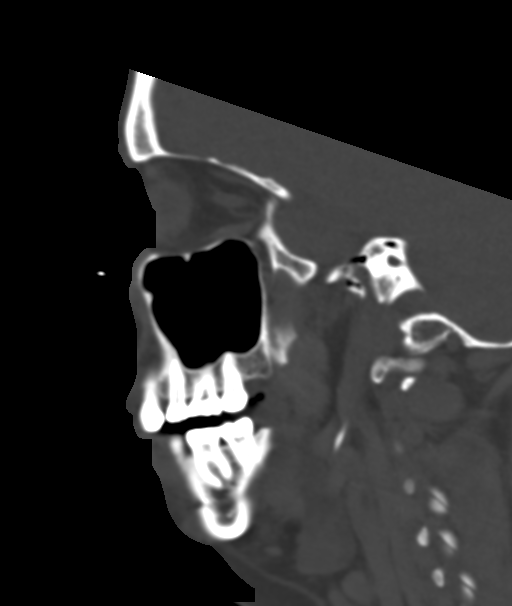
[im 35/70  bone]
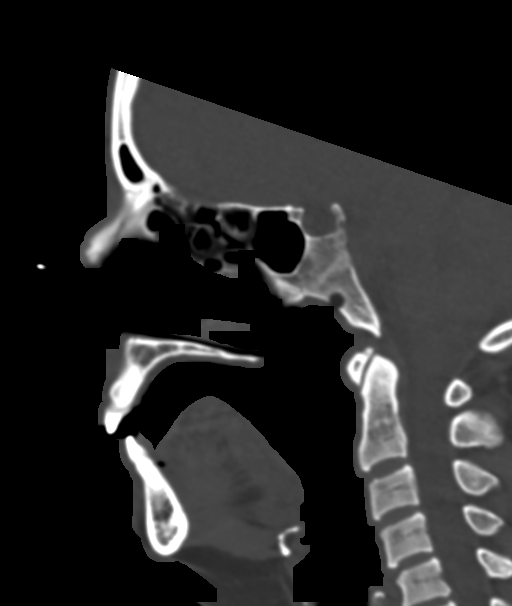
[im 47/70  bone]
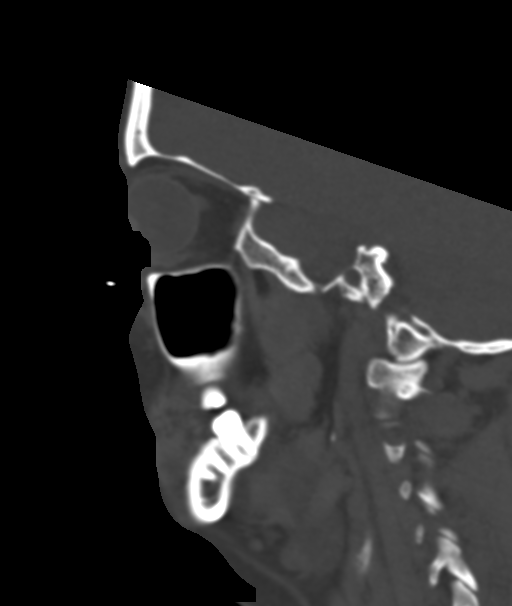

[15 of 47 positions shown; findings below may reference images not displayed]

FINDINGS: CT HEAD FINDINGS

Brain: No acute infarct or hemorrhage. Lateral ventricles are
grossly unremarkable. 4 mm hyperdense structure in the third
ventricle likely reflects a small colloid cyst. Remaining midline
structures are unremarkable. No acute extra-axial fluid collections.
No mass effect.

Vascular: No hyperdense vessel or unexpected calcification.

Skull: There is a large scalp hematoma along the left frontotemporal
region, with no underlying fracture. The remainder of the calvarium
is unremarkable.

Other: None.

CT MAXILLOFACIAL FINDINGS

Osseous: No fracture or mandibular dislocation. No destructive
process.

Orbits: Negative. No traumatic or inflammatory finding.

Sinuses: Clear.

Soft tissues: Large left frontotemporal scalp hematoma. Remaining
soft tissues are unremarkable.

CT CERVICAL SPINE FINDINGS

Alignment: Alignment is grossly anatomic.

Skull base and vertebrae: No acute displaced fractures.

Soft tissues and spinal canal: No prevertebral fluid or swelling. No
visible canal hematoma.

Disc levels: Mild spondylosis at C5-6, without significant
compressive sequela. Mild facet hypertrophy on the right at C7/T1.

Upper chest: Airway is patent.  Lung apices are clear.

Other: Reconstructed images demonstrate no additional findings.
IMPRESSION: 1. Large left frontotemporal scalp hematoma. No underlying fracture.
2. No acute intracranial process.
3. No evidence of acute facial bone fracture.
4. No acute cervical spine fracture.

## 2021-10-01 IMAGING — CT CT CERVICAL SPINE W/O CM
3 of 4 series · 12 of 35 positions shown, 14 images · non-contrast
Comparison: None.

CLINICAL DATA: Fell down 6 steps, loss of consciousness, head pain,
right orbital bruising, neck pain

EXAM:
CT HEAD WITHOUT CONTRAST
CT MAXILLOFACIAL WITHOUT CONTRAST
CT CERVICAL SPINE WITHOUT CONTRAST
TECHNIQUE: Multidetector CT imaging of the head, cervical spine, and
maxillofacial structures were performed using the standard protocol
without intravenous contrast. Multiplanar CT image reconstructions
of the cervical spine and maxillofacial structures were also
generated.

[Series 4: sagittal bone · sagittal · 0.24mm/px · 5 of 54 slices shown, 6 images]
[im 18/54  bone]
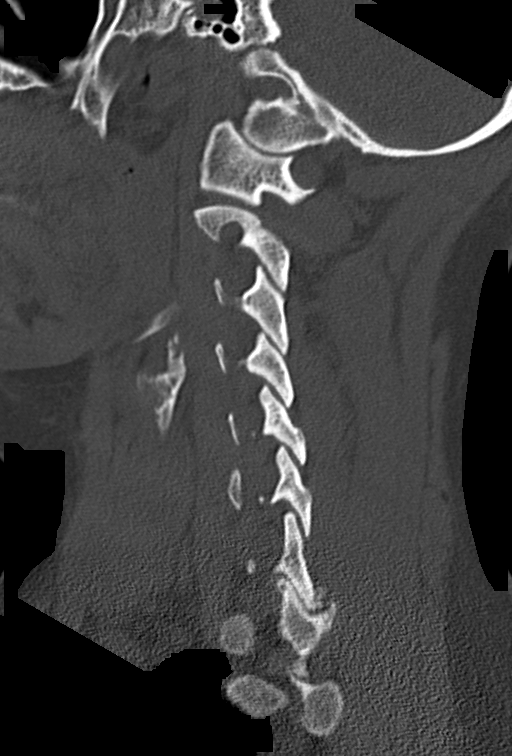
[im 23/54  bone]
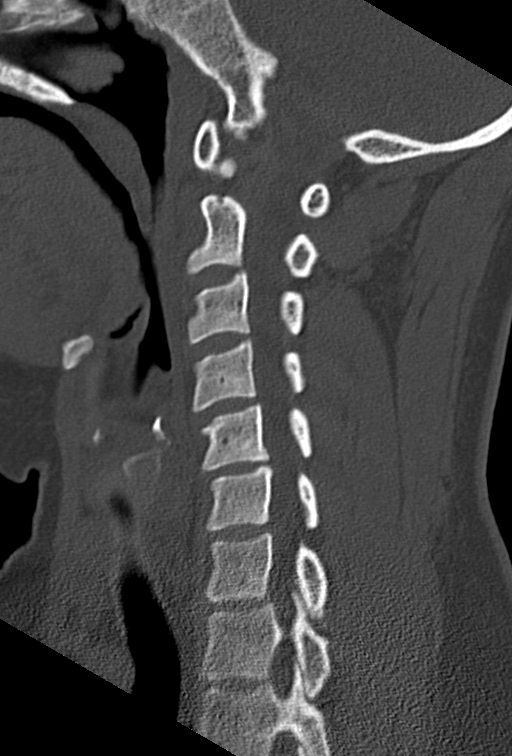
[im 27/54  soft-tissue]
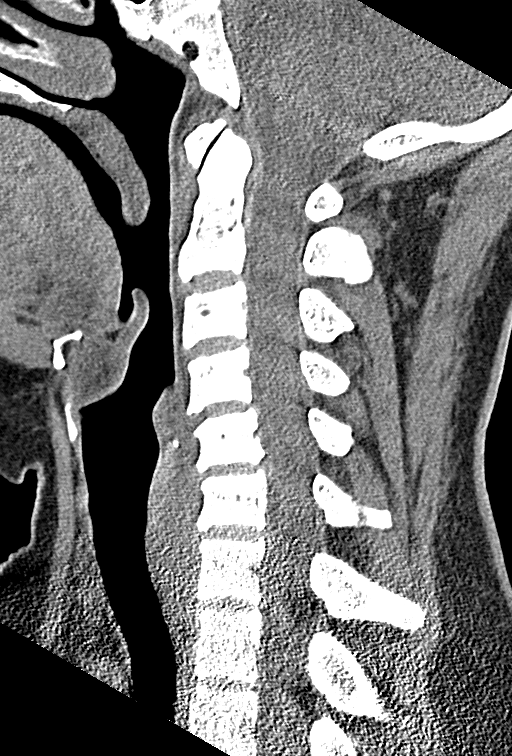
[im 27/54  bone]
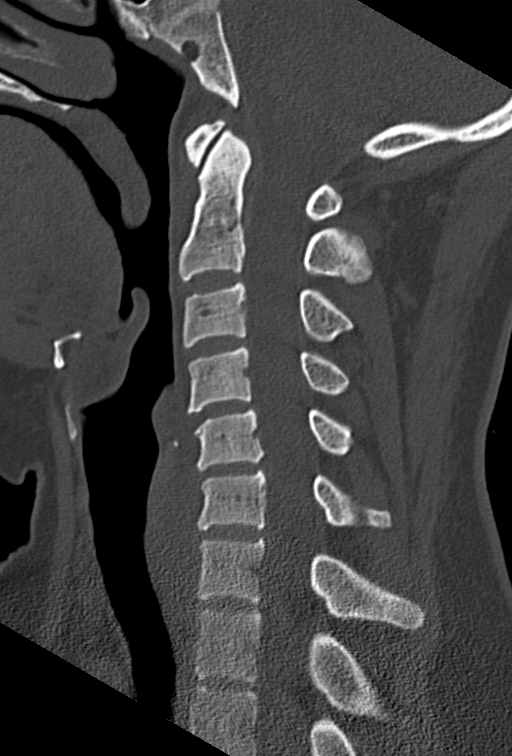
[im 31/54  bone]
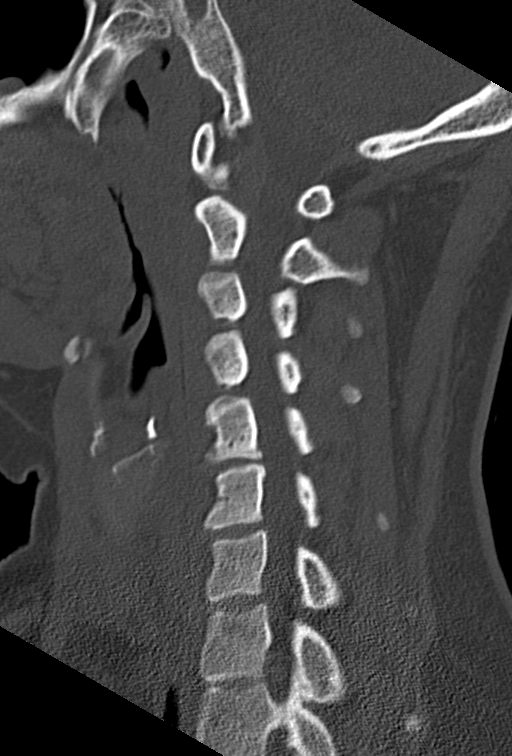
[im 36/54  bone]
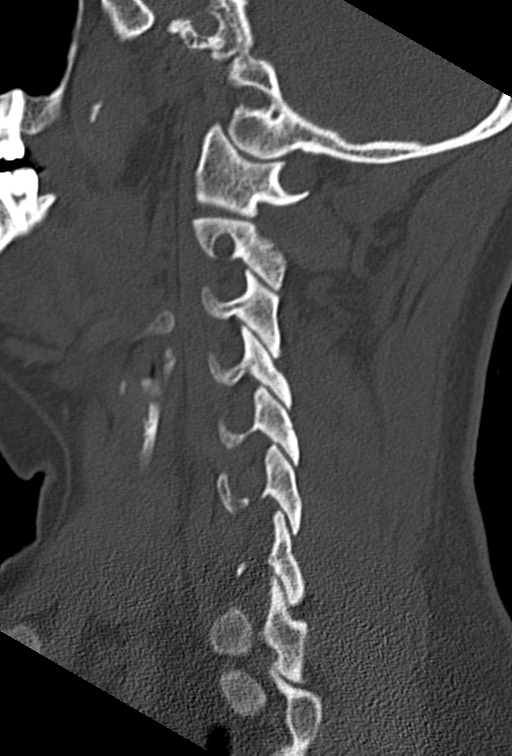

[Series 5: coronal bone · coronal · 0.23mm/px · 3 of 44 slices shown]
[im 9/44  bone]
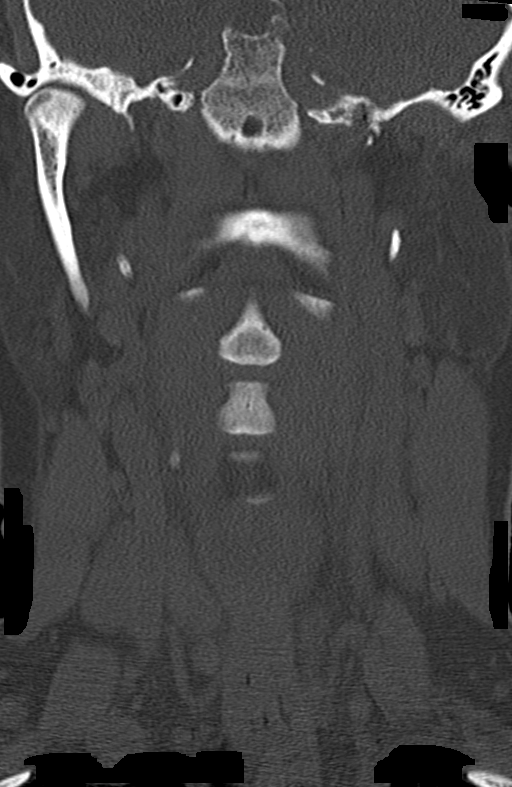
[im 18/44  bone]
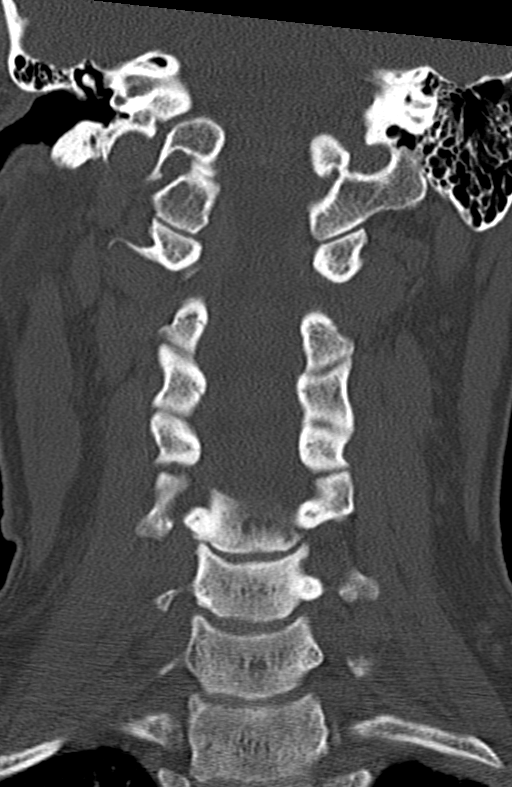
[im 26/44  bone]
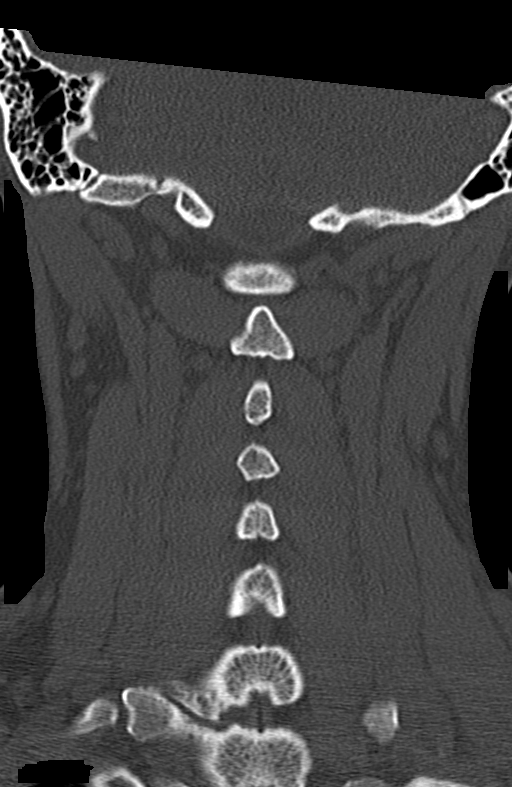

[Series 6: orthogonal bone · axial · 0.25mm/px · z∈[+300,+399]mm · 4 of 92 slices shown, 5 images]
[im 16/92  soft-tissue]
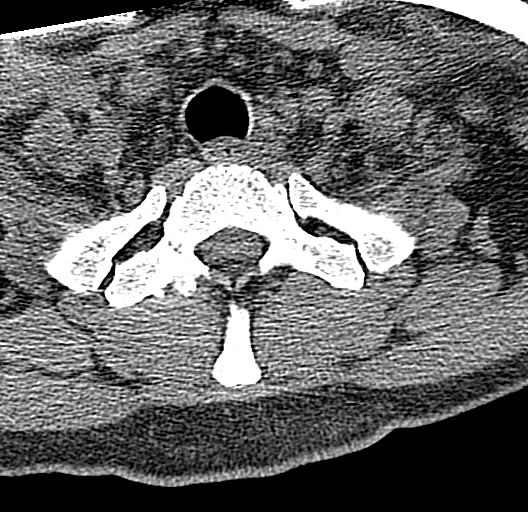
[im 16/92  bone]
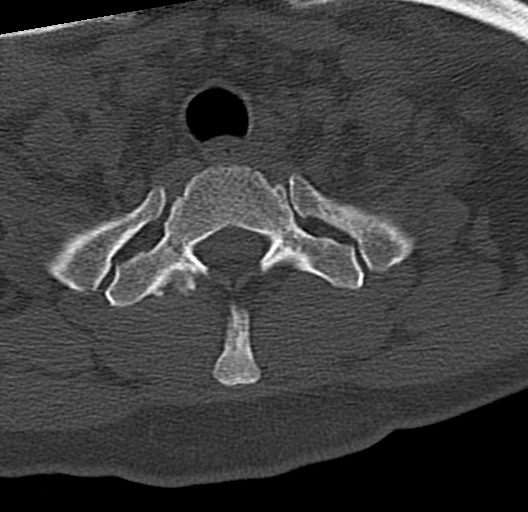
[im 31/92  bone]
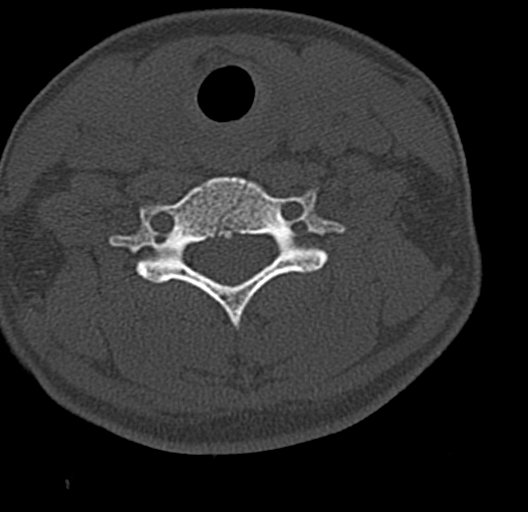
[im 61/92  bone]
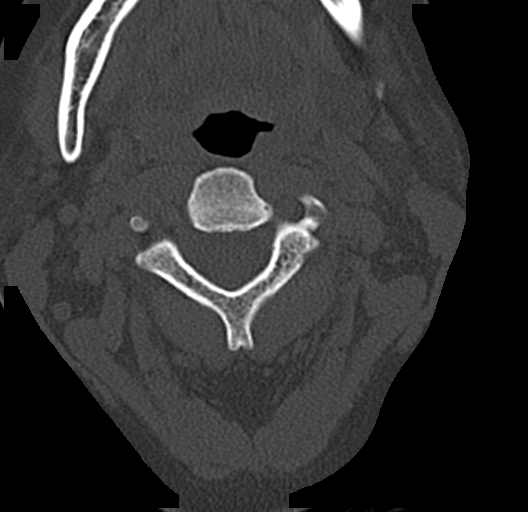
[im 76/92  bone]
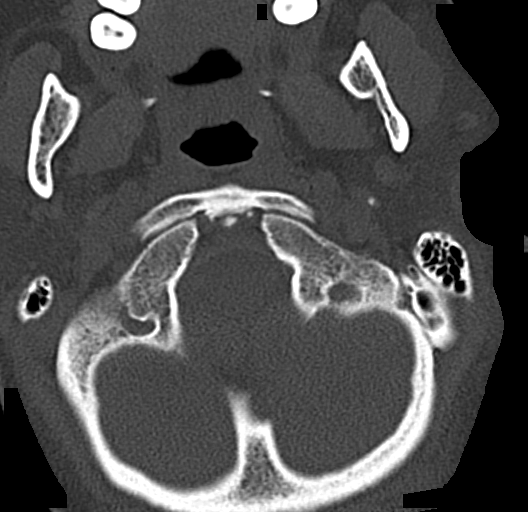

[12 of 35 positions shown; findings below may reference images not displayed]

FINDINGS: CT HEAD FINDINGS

Brain: No acute infarct or hemorrhage. Lateral ventricles are
grossly unremarkable. 4 mm hyperdense structure in the third
ventricle likely reflects a small colloid cyst. Remaining midline
structures are unremarkable. No acute extra-axial fluid collections.
No mass effect.

Vascular: No hyperdense vessel or unexpected calcification.

Skull: There is a large scalp hematoma along the left frontotemporal
region, with no underlying fracture. The remainder of the calvarium
is unremarkable.

Other: None.

CT MAXILLOFACIAL FINDINGS

Osseous: No fracture or mandibular dislocation. No destructive
process.

Orbits: Negative. No traumatic or inflammatory finding.

Sinuses: Clear.

Soft tissues: Large left frontotemporal scalp hematoma. Remaining
soft tissues are unremarkable.

CT CERVICAL SPINE FINDINGS

Alignment: Alignment is grossly anatomic.

Skull base and vertebrae: No acute displaced fractures.

Soft tissues and spinal canal: No prevertebral fluid or swelling. No
visible canal hematoma.

Disc levels: Mild spondylosis at C5-6, without significant
compressive sequela. Mild facet hypertrophy on the right at C7/T1.

Upper chest: Airway is patent.  Lung apices are clear.

Other: Reconstructed images demonstrate no additional findings.
IMPRESSION: 1. Large left frontotemporal scalp hematoma. No underlying fracture.
2. No acute intracranial process.
3. No evidence of acute facial bone fracture.
4. No acute cervical spine fracture.

## 2021-10-01 IMAGING — CT CT HEAD W/O CM
3 series · 14 of 45 positions shown, 16 images · non-contrast
Comparison: None.

CLINICAL DATA: Fell down 6 steps, loss of consciousness, head pain,
right orbital bruising, neck pain

EXAM:
CT HEAD WITHOUT CONTRAST
CT MAXILLOFACIAL WITHOUT CONTRAST
CT CERVICAL SPINE WITHOUT CONTRAST
TECHNIQUE: Multidetector CT imaging of the head, cervical spine, and
maxillofacial structures were performed using the standard protocol
without intravenous contrast. Multiplanar CT image reconstructions
of the cervical spine and maxillofacial structures were also
generated.

[Series 2: head wo · axial · 0.41mm/px · z∈[+471,+586]mm · 8 of 28 slices shown, 10 images]
[im 3/28  brain]
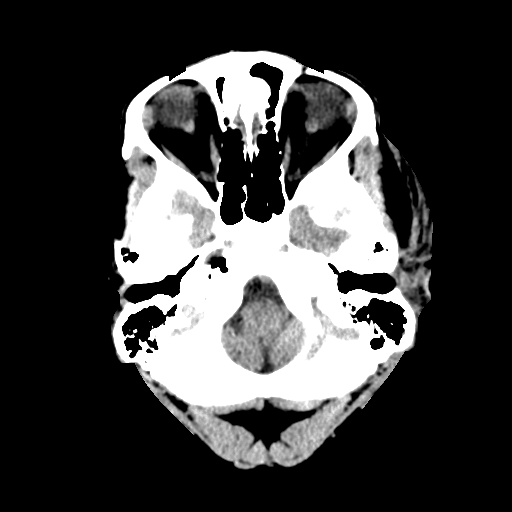
[im 3/28  bone]
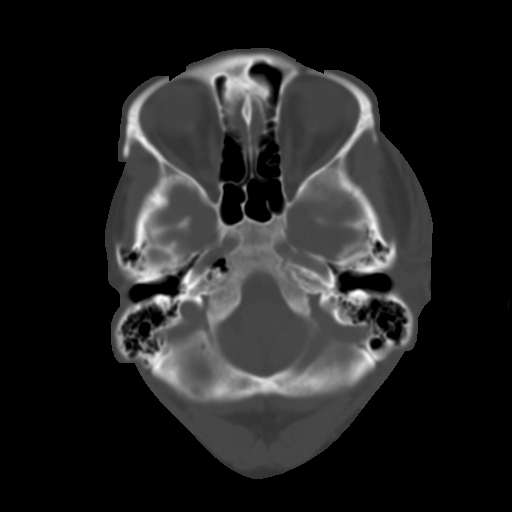
[im 6/28  brain]
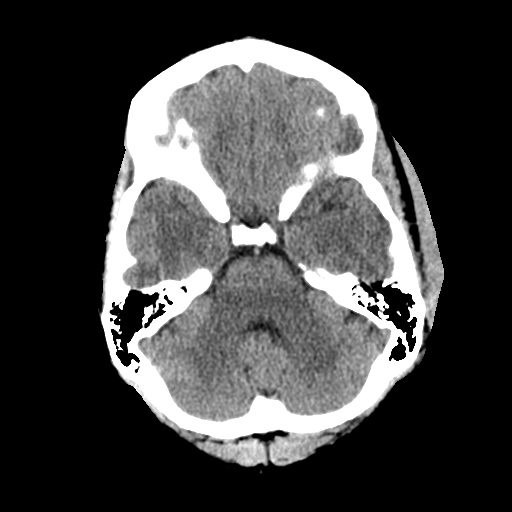
[im 10/28  brain]
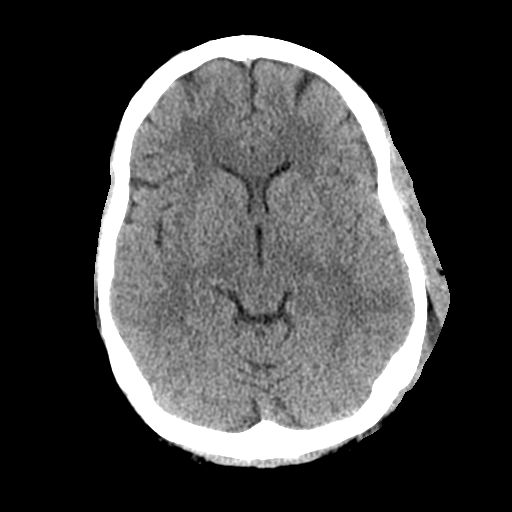
[im 13/28  brain]
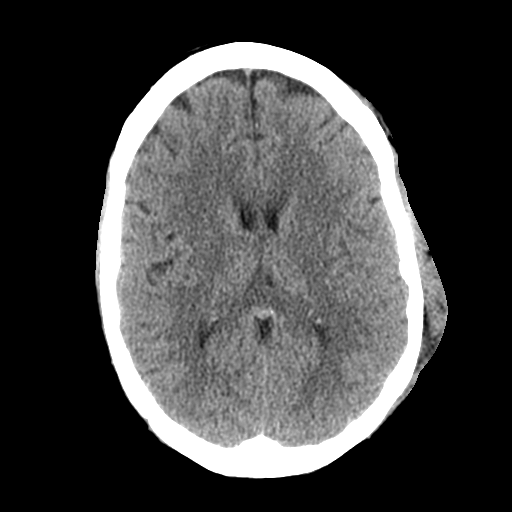
[im 16/28  brain]
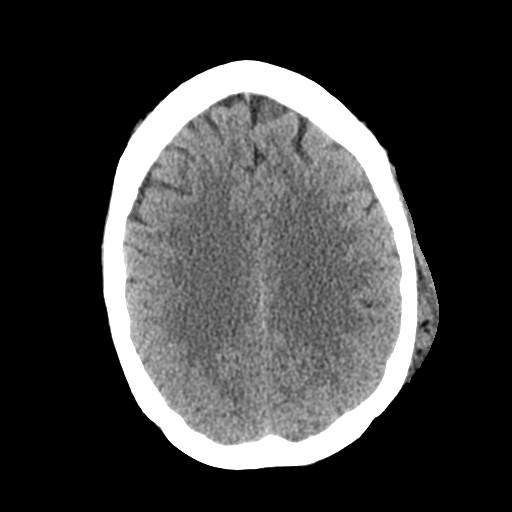
[im 16/28  bone]
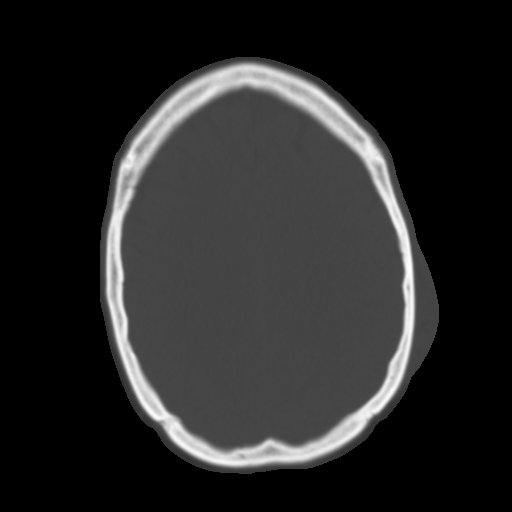
[im 19/28  brain]
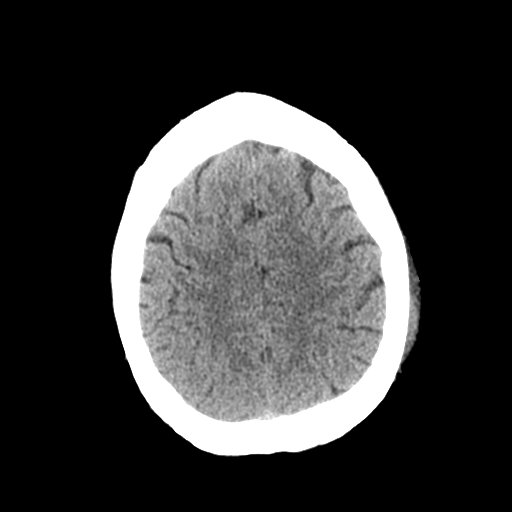
[im 23/28  brain]
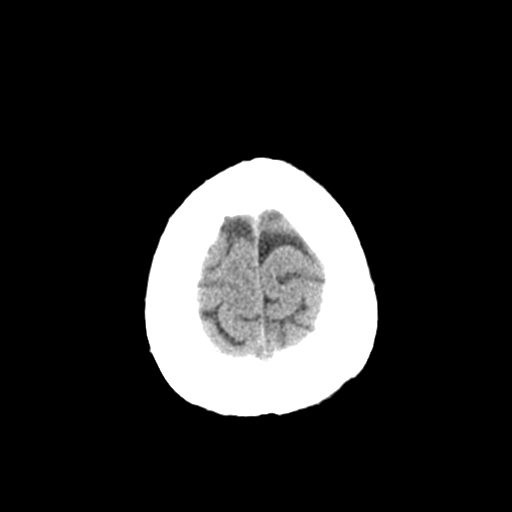
[im 26/28  brain]
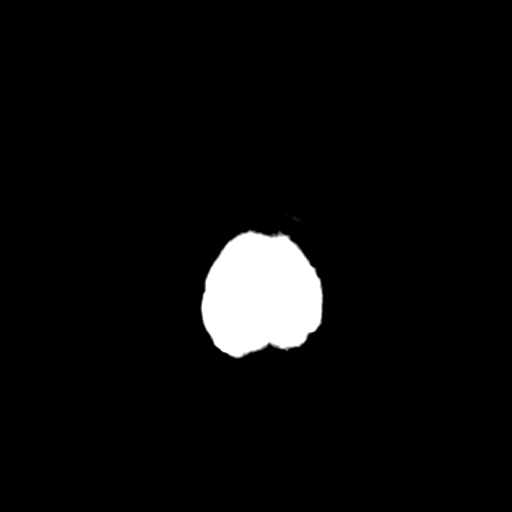

[Series 4: coronal soft tissue · coronal · 0.29mm/px · 3 of 63 slices shown]
[im 21/63  brain]
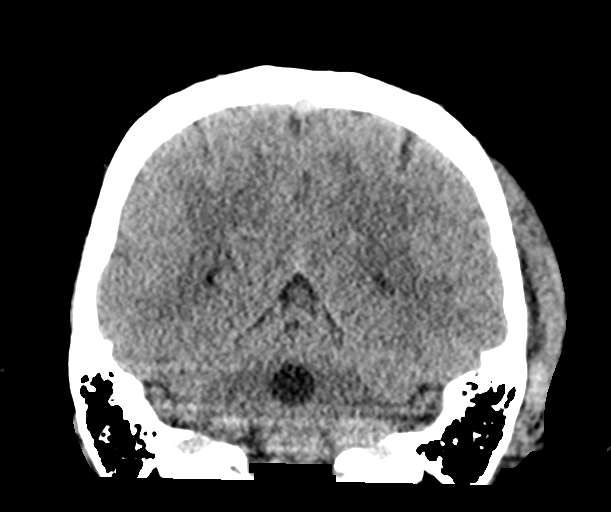
[im 28/63  brain]
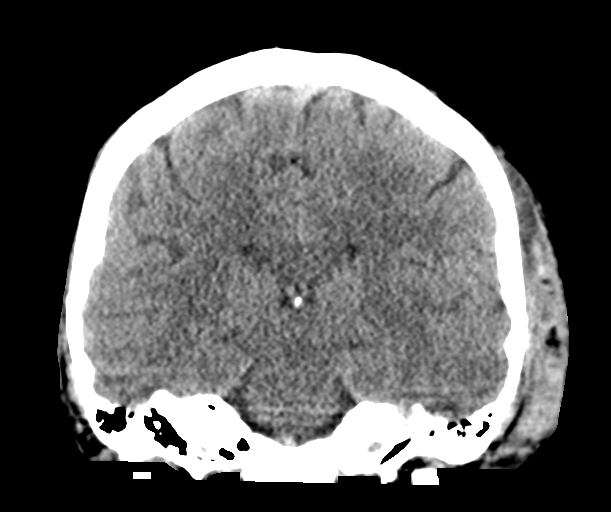
[im 35/63  brain]
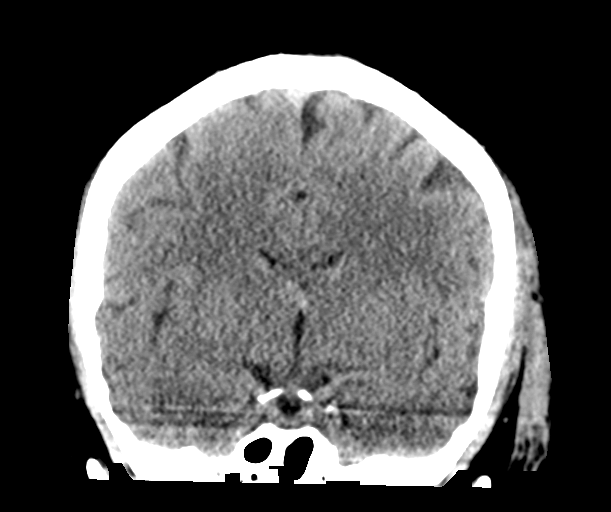

[Series 5: sagittal soft tissue · sagittal · 0.29mm/px · 3 of 53 slices shown]
[im 18/53  brain]
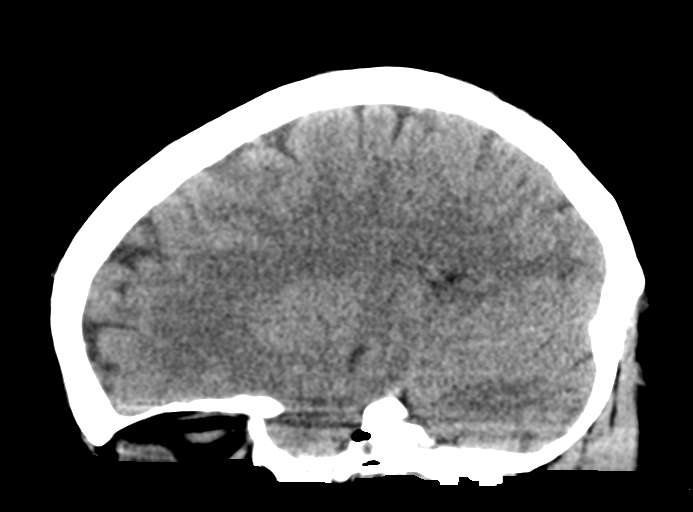
[im 27/53  brain]
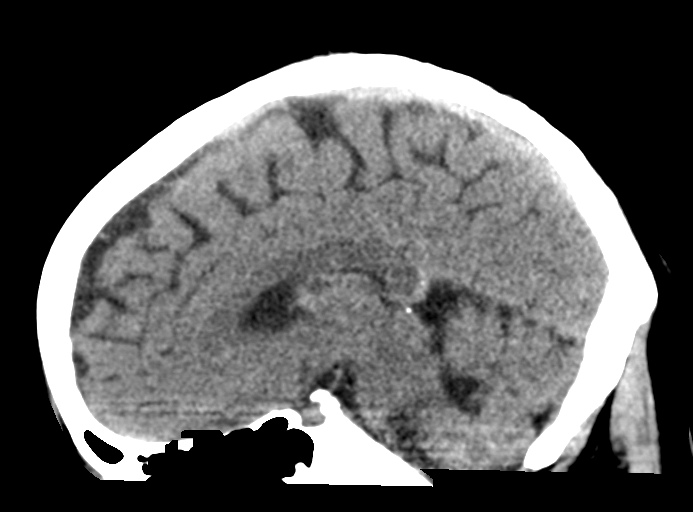
[im 35/53  brain]
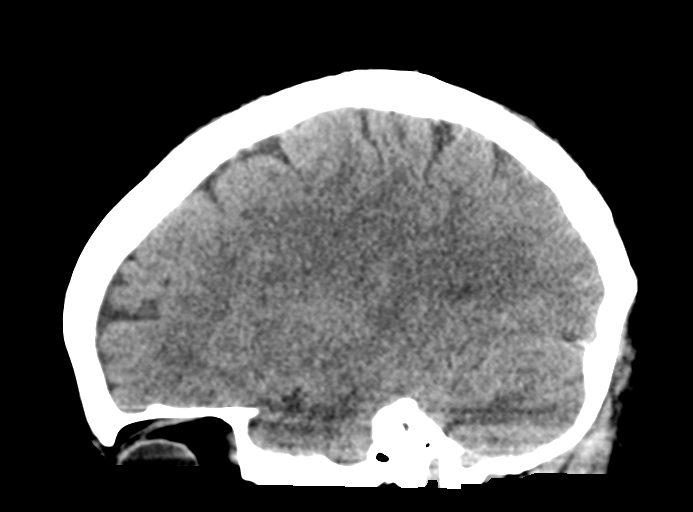

[14 of 45 positions shown; findings below may reference images not displayed]

FINDINGS: CT HEAD FINDINGS

Brain: No acute infarct or hemorrhage. Lateral ventricles are
grossly unremarkable. 4 mm hyperdense structure in the third
ventricle likely reflects a small colloid cyst. Remaining midline
structures are unremarkable. No acute extra-axial fluid collections.
No mass effect.

Vascular: No hyperdense vessel or unexpected calcification.

Skull: There is a large scalp hematoma along the left frontotemporal
region, with no underlying fracture. The remainder of the calvarium
is unremarkable.

Other: None.

CT MAXILLOFACIAL FINDINGS

Osseous: No fracture or mandibular dislocation. No destructive
process.

Orbits: Negative. No traumatic or inflammatory finding.

Sinuses: Clear.

Soft tissues: Large left frontotemporal scalp hematoma. Remaining
soft tissues are unremarkable.

CT CERVICAL SPINE FINDINGS

Alignment: Alignment is grossly anatomic.

Skull base and vertebrae: No acute displaced fractures.

Soft tissues and spinal canal: No prevertebral fluid or swelling. No
visible canal hematoma.

Disc levels: Mild spondylosis at C5-6, without significant
compressive sequela. Mild facet hypertrophy on the right at C7/T1.

Upper chest: Airway is patent.  Lung apices are clear.

Other: Reconstructed images demonstrate no additional findings.
IMPRESSION: 1. Large left frontotemporal scalp hematoma. No underlying fracture.
2. No acute intracranial process.
3. No evidence of acute facial bone fracture.
4. No acute cervical spine fracture.

## 2021-10-10 ENCOUNTER — Other Ambulatory Visit
Admission: RE | Admit: 2021-10-10 | Discharge: 2021-10-10 | Disposition: A | Discharge status: Home / Self Care | Payer: Self-pay | Source: Ambulatory Visit | Attending: Internal Medicine | Admitting: Internal Medicine

## 2021-10-10 ENCOUNTER — Ambulatory Visit
Admission: EM | Admit: 2021-10-10 | Discharge: 2021-10-10 | Disposition: A | Discharge status: Home / Self Care | Payer: Self-pay | Attending: Internal Medicine | Admitting: Internal Medicine

## 2021-10-10 ENCOUNTER — Encounter: Payer: Self-pay | Admitting: Emergency Medicine

## 2021-10-10 DIAGNOSIS — R300 Dysuria: Secondary | ICD-10-CM | POA: Insufficient documentation

## 2021-10-10 DIAGNOSIS — N3090 Cystitis, unspecified without hematuria: Secondary | ICD-10-CM | POA: Insufficient documentation

## 2021-10-10 DIAGNOSIS — Z113 Encounter for screening for infections with a predominantly sexual mode of transmission: Secondary | ICD-10-CM | POA: Insufficient documentation

## 2021-10-10 LAB — POCT URINALYSIS DIP (MANUAL ENTRY)
Bilirubin, UA: NEGATIVE
Blood, UA: NEGATIVE
Glucose, UA: NEGATIVE mg/dL
Leukocytes, UA: NEGATIVE
Nitrite, UA: NEGATIVE
Protein Ur, POC: NEGATIVE mg/dL
Spec Grav, UA: >=1.03 — AB (ref 1.010–1.025)
Urobilinogen, UA: 0.2 E.U./dL
pH, UA: 6 (ref 5.0–8.0)

## 2021-10-10 MED ORDER — FLUCONAZOLE 150 MG PO TABS: 150.0000 mg | ORAL_TABLET | Freq: Every day | ORAL | 0 refills | Status: DC

## 2021-10-10 MED ORDER — CEFTRIAXONE SODIUM 500 MG IJ SOLR
500.0000 mg | Freq: Once | INTRAMUSCULAR | Status: AC
  Administered 2021-10-10: 500 mg via INTRAMUSCULAR

## 2021-10-10 MED ORDER — DOXYCYCLINE HYCLATE 100 MG PO CAPS
100.0000 mg | ORAL_CAPSULE | Freq: Two times a day (BID) | ORAL | 0 refills | Status: DC
Start: 2021-10-10 — End: 2022-01-26

## 2021-10-10 MED ORDER — NITROFURANTOIN MONOHYD MACRO 100 MG PO CAPS
100.0000 mg | ORAL_CAPSULE | Freq: Two times a day (BID) | ORAL | 0 refills | Status: DC
Start: 2021-10-10 — End: 2022-03-26

## 2021-10-10 MED ORDER — PHENAZOPYRIDINE HCL 200 MG PO TABS
200.0000 mg | ORAL_TABLET | Freq: Three times a day (TID) | ORAL | 0 refills | Status: DC
Start: 2021-10-10 — End: 2022-03-26

## 2021-10-10 NOTE — ED Provider Notes (Signed)
Renaldo Fiddler    CSN: [id number] Arrival date & time: 10/10/21  1817      History   Chief Complaint Chief Complaint  Patient presents with   Dysuria    HPI Erica Larson is a 45 y.o. female who presents with dysuria.  She had sex with her ex who has been cheating with the same woman for 7 years and pt had sex with him 3 days ago. Has persistent burning and bladder pressure which is getting worse.  Has worsening frequency and urgency and normally the dip UA shows negative but cultures come back positive when she gets this way.  She is leaving town tomorrow and will be gone for 10 days Has been having pain on her L SI area.    Past Medical History:  Diagnosis Date   Acne    Amenorrhea    Anxiety    Bipolar disorder (HCC)    type 2   Depression    Hypothyroidism    Infertility, female    treatment with oligammenorhea  Dr Chevis Pretty    Migraine    Restless legs syndrome    currently no problems per patient   SVD (spontaneous vaginal delivery)    x 1   Thyroiditis     Patient Active Problem List   Diagnosis Date Noted   S/P laparoscopic assisted vaginal hysterectomy (LAVH) 12/29/2017   Restless leg syndrome 11/05/2015   Bipolar 2 disorder, major depressive episode (HCC) 11/04/2015   Anxiety state, unspecified 09/27/2013   Fatigue 01/02/2011   VITAMIN D DEFICIENCY 01/05/2009   Hypercalcemia 01/05/2009   NUMBNESS 01/05/2009   THYROIDITIS 01/02/2009   PALPITATIONS, RECURRENT 01/02/2009   SINUSITIS- ACUTE-NOS 08/15/2008   FEMALE INFERTILITY 07/18/2008   WEIGHT GAIN 07/18/2008   ACUTE TONSILLITIS 12/30/2007   URI 12/30/2007   MIGRAINE HEADACHE 08/12/2007   AMENORRHEA 08/12/2007    Past Surgical History:  Procedure Laterality Date   CYSTOSCOPY  12/29/2017   Procedure: CYSTOSCOPY;  Surgeon: Ranae Pila, MD;  Location: [facility];  Service: Gynecology;;   DILATION AND CURETTAGE OF UTERUS     LAPAROSCOPIC VAGINAL HYSTERECTOMY WITH SALPINGECTOMY  Bilateral 12/29/2017   Procedure: LAPAROSCOPIC ASSISTED VAGINAL HYSTERECTOMY WITH SALPINGECTOMY, mccalls culdoplasty;  Surgeon: Ranae Pila, MD;  Location: [facility];  Service: Gynecology;  Laterality: Bilateral;   WISDOM TOOTH EXTRACTION      OB History     Gravida  3   Para  1   Term      Preterm  1   AB  2   Living  1      SAB      IAB      Ectopic  2   Multiple      Live Births               Home Medications    Prior to Admission medications   Medication Sig Start Date End Date Taking? Authorizing Provider  doxycycline (VIBRAMYCIN) 100 MG capsule Take 1 capsule (100 mg total) by mouth 2 (two) times daily. 10/10/21  Yes Rodriguez-Southworth, Nettie Elm, PA-C  fluconazole (DIFLUCAN) 150 MG tablet Take 1 tablet (150 mg total) by mouth daily. 10/10/21  Yes Rodriguez-Southworth, Nettie Elm, PA-C  nitrofurantoin, macrocrystal-monohydrate, (MACROBID) 100 MG capsule Take 1 capsule (100 mg total) by mouth 2 (two) times daily. 10/10/21  Yes Rodriguez-Southworth, Nettie Elm, PA-C  phenazopyridine (PYRIDIUM) 200 MG tablet Take 1 tablet (200 mg total) by mouth 3 (three) times daily. 10/10/21  Yes Rodriguez-Southworth, Nettie Elm,  PA-C  gabapentin (NEURONTIN) 300 MG capsule Take 1 capsule (300 mg total) by mouth 3 (three) times daily. 12/06/19   Lomax, Amy, NP  ibuprofen (ADVIL,MOTRIN) 800 MG tablet Take 1 tablet (800 mg total) by mouth every 8 (eight) hours as needed. 12/31/17   Tyson Dense, MD  levothyroxine (SYNTHROID, LEVOTHROID) 50 MCG tablet Take 50 mcg by mouth daily before breakfast.  09/10/13   [provider]  ondansetron (ZOFRAN) 4 MG tablet Take 1 tablet (4 mg total) by mouth every 6 (six) hours. 02/24/20   Wurst, [place], PA-C  rOPINIRole (REQUIP) 3 MG tablet Take 3 mg by mouth at bedtime.    [provider]  ARIPiprazole (ABILIFY) 5 MG tablet Take 1 tablet (5 mg total) by mouth daily. 10/29/17 12/06/19  Clarene Reamer, MD    Family  History Family History  Problem Relation Age of Onset   Thyroid disease Mother    Nephrolithiasis Mother    Urolithiasis Mother    Heart attack Mother        signs but neg eval   Nephrolithiasis Father    Urolithiasis Father    Anxiety disorder Father    Drug abuse Father    Thyroid disease Sister    Heart disease Maternal Grandmother     Social History Social History   Tobacco Use   Smoking status: Never   Smokeless tobacco: Never  Vaping Use   Vaping Use: Never used  Substance Use Topics   Alcohol use: Yes    Comment: Rarely   Drug use: No     Allergies   Compazine [prochlorperazine] and Oxycodone   Review of Systems Review of Systems  Constitutional:  Negative for fever.  Genitourinary:  Positive for dysuria, frequency and urgency. Negative for flank pain, vaginal discharge and vaginal pain.  Musculoskeletal:  Positive for back pain.    Physical Exam Triage Vital Signs ED Triage Vitals  Enc Vitals Group     BP 10/10/21 1921 125/84     Pulse Rate 10/10/21 1921 93     Resp 10/10/21 1921 18     Temp 10/10/21 1921 98.1 F (36.7 C)     Temp src --      SpO2 10/10/21 1921 99 %     Weight --      Height --      Head Circumference --      Peak Flow --      Pain Score 10/10/21 1922 2     Pain Loc --      Pain Edu? --      Excl. in [place]? --    No data found.  Updated Vital Signs BP 125/84   Pulse 93   Temp 98.1 F (36.7 C)   Resp 18   LMP  (LMP Unknown)   SpO2 99%   Visual Acuity Right Eye Distance:   Left Eye Distance:   Bilateral Distance:    Right Eye Near:   Left Eye Near:    Bilateral Near:     Physical Exam Physical Exam Vitals and nursing note reviewed.  Constitutional:      General: She is not in acute distress.    Appearance: She is not toxic-appearing.  HENT:     Head: Normocephalic.     Right Ear: External ear normal.     Left Ear: External ear normal.  Eyes:     General: No scleral icterus.    Conjunctiva/sclera:  Conjunctivae normal.  Pulmonary:  Effort: Pulmonary effort is normal.  Abdominal:     General: Bowel sounds are normal.     Palpations: Abdomen is soft. There is no mass.     Tenderness: There is no guarding or rebound.     Comments: +/- CVA tenderness   Musculoskeletal:        General: Normal range of motion.     Cervical back: Neck supple.     Comments: BACK- has muscular tenderness on area of complaint with palpation  Skin:    General: Skin is warm and dry.     Findings: No rash.  Neurological:     Mental Status: She is alert and oriented to person, place, and time.     Gait: Gait normal.  Psychiatric:        Mood and Affect: Mood normal.        Behavior: Behavior normal.        Thought Content: Thought content normal.        Judgment: Judgment normal.    UC Treatments / Results  Labs (all labs ordered are listed, but only abnormal results are displayed) Labs Reviewed  POCT URINALYSIS DIP (MANUAL ENTRY) - Abnormal; Notable for the following components:      Result Value   Ketones, POC UA trace (5) (*)    Spec Grav, UA >=1.030 (*)    All other components within normal limits  URINE CULTURE  CERVICOVAGINAL ANCILLARY ONLY    EKG   Radiology No results found.  Procedures Procedures (including critical care time)  Medications Ordered in UC Medications  cefTRIAXone (ROCEPHIN) injection 500 mg (500 mg Intramuscular Given 10/10/21 1945)    Initial Impression / Assessment and Plan / UC Course  I have reviewed the triage vital signs and the nursing notes.  Pertinent labs results that were available during my care of the patient were reviewed by me and considered in my medical decision making (see chart for details).  Dysuria STD screen She was given Rocephin 5000 mg IM I placed her on Macrobid, Diflucan, Pyridium and Doxy as noted. We will call her if the tests are positive. She has mychart and will check on her results while out of town.    Final Clinical  Impressions(s) / UC Diagnoses   Final diagnoses:  Cystitis   Discharge Instructions   None    ED Prescriptions     Medication Sig Dispense Auth. Provider   nitrofurantoin, macrocrystal-monohydrate, (MACROBID) 100 MG capsule Take 1 capsule (100 mg total) by mouth 2 (two) times daily. 14 capsule Rodriguez-Southworth, Symphani Eckstrom, PA-C   fluconazole (DIFLUCAN) 150 MG tablet Take 1 tablet (150 mg total) by mouth daily. 2 tablet Rodriguez-Southworth, Sunday Spillers, PA-C   phenazopyridine (PYRIDIUM) 200 MG tablet Take 1 tablet (200 mg total) by mouth 3 (three) times daily. 6 tablet Rodriguez-Southworth, Sunday Spillers, PA-C   doxycycline (VIBRAMYCIN) 100 MG capsule Take 1 capsule (100 mg total) by mouth 2 (two) times daily. 20 capsule Rodriguez-Southworth, Sunday Spillers, PA-C      PDMP not reviewed this encounter.   Shelby Mattocks, Hershal Coria 10/10/21 2049

## 2021-10-10 NOTE — ED Triage Notes (Signed)
Pt here with burning on urination, some vaginal burning and discharge. States it feels like a yeast infection. She states that her husband has recently cheated on her as well and would like a swab.

## 2021-10-12 LAB — URINE CULTURE: Culture: <10000 — AB

## 2021-10-17 LAB — CERVICOVAGINAL ANCILLARY ONLY
Bacterial Vaginitis (gardnerella): NEGATIVE
Candida Glabrata: NEGATIVE
Candida Vaginitis: POSITIVE — AB
Chlamydia: NEGATIVE
Comment: NEGATIVE
Comment: NEGATIVE
Comment: NEGATIVE
Comment: NEGATIVE
Comment: NEGATIVE
Comment: NORMAL
Neisseria Gonorrhea: NEGATIVE
Trichomonas: NEGATIVE

## 2021-12-10 ENCOUNTER — Other Ambulatory Visit: Payer: Self-pay

## 2021-12-10 ENCOUNTER — Encounter: Payer: Self-pay | Admitting: Emergency Medicine

## 2021-12-10 ENCOUNTER — Ambulatory Visit
Admission: EM | Admit: 2021-12-10 | Discharge: 2021-12-10 | Disposition: A | Discharge status: Home / Self Care | Payer: 59

## 2021-12-10 ENCOUNTER — Ambulatory Visit: Payer: Self-pay

## 2021-12-10 DIAGNOSIS — M25512 Pain in left shoulder: Secondary | ICD-10-CM

## 2021-12-10 DIAGNOSIS — M5441 Lumbago with sciatica, right side: Secondary | ICD-10-CM | POA: Diagnosis not present

## 2021-12-10 LAB — POCT URINALYSIS DIP (MANUAL ENTRY)
Bilirubin, UA: NEGATIVE
Blood, UA: NEGATIVE
Glucose, UA: NEGATIVE mg/dL
Ketones, POC UA: NEGATIVE mg/dL
Leukocytes, UA: NEGATIVE
Nitrite, UA: NEGATIVE
Protein Ur, POC: NEGATIVE mg/dL
Spec Grav, UA: 1.025 (ref 1.010–1.025)
Urobilinogen, UA: 0.2 E.U./dL
pH, UA: 7 (ref 5.0–8.0)

## 2021-12-10 MED ORDER — METHOCARBAMOL 500 MG PO TABS: 500.0000 mg | ORAL_TABLET | Freq: Two times a day (BID) | ORAL | 0 refills | Status: DC | PRN

## 2021-12-10 MED ORDER — KETOROLAC TROMETHAMINE 30 MG/ML IJ SOLN
30.0000 mg | Freq: Once | INTRAMUSCULAR | Status: AC
  Administered 2021-12-10: 30 mg via INTRAMUSCULAR

## 2021-12-10 NOTE — ED Provider Notes (Signed)
Roderic Palau    CSN: [id number] Arrival date & time: 12/10/21  1441      History   Chief Complaint Chief Complaint  Patient presents with   Back Pain   Shoulder Pain    HPI Erica Larson is a 46 y.o. female.  Patient presents with bilateral low back pain x1 day.  The pain occasionally radiates down her right leg; worse with movement, improves with rest.  She also reports left shoulder pain x1 week that started after she was holding her newborn grandchild.  She denies shoulder or back trauma; no falls or injury.  She denies wounds, redness, bruising, numbness, weakness, saddle anesthesia, loss of bowel/bladder control, abdominal pain, dysuria, hematuria, vaginal discharge, pelvic pain, or other symptoms.  Treatment attempted at home with ibuprofen; none taken today.  Her medical history includes migraine headaches, bipolar disorder, restless leg syndrome, depression, hypothyroidism.  The history is provided by the patient and medical records.   Past Medical History:  Diagnosis Date   Acne    Amenorrhea    Anxiety    Bipolar disorder ([place])    type 2   Depression    Hypothyroidism    Infertility, female    treatment with oligammenorhea  Dr Carren Rang    Migraine    Restless legs syndrome    currently no problems per patient   SVD (spontaneous vaginal delivery)    x 1   Thyroiditis     Patient Active Problem List   Diagnosis Date Noted   S/P laparoscopic assisted vaginal hysterectomy (LAVH) 12/29/2017   Restless leg syndrome 11/05/2015   Bipolar 2 disorder, major depressive episode ([place]) 11/04/2015   Anxiety state, unspecified 09/27/2013   Fatigue 01/02/2011   VITAMIN D DEFICIENCY 01/05/2009   Hypercalcemia 01/05/2009   NUMBNESS 01/05/2009   THYROIDITIS 01/02/2009   PALPITATIONS, RECURRENT 01/02/2009   SINUSITIS- ACUTE-NOS 08/15/2008   FEMALE INFERTILITY 07/18/2008   WEIGHT GAIN 07/18/2008   ACUTE TONSILLITIS 12/30/2007   URI 12/30/2007   MIGRAINE HEADACHE  08/12/2007   AMENORRHEA 08/12/2007    Past Surgical History:  Procedure Laterality Date   CYSTOSCOPY  12/29/2017   Procedure: CYSTOSCOPY;  Surgeon: Tyson Dense, MD;  Location: [facility];  Service: Gynecology;;   DILATION AND CURETTAGE OF UTERUS     LAPAROSCOPIC VAGINAL HYSTERECTOMY WITH SALPINGECTOMY Bilateral 12/29/2017   Procedure: LAPAROSCOPIC ASSISTED VAGINAL HYSTERECTOMY WITH SALPINGECTOMY, mccalls culdoplasty;  Surgeon: Tyson Dense, MD;  Location: [facility];  Service: Gynecology;  Laterality: Bilateral;   WISDOM TOOTH EXTRACTION      OB History     Gravida  3   Para  1   Term      Preterm  1   AB  2   Living  1      SAB      IAB      Ectopic  2   Multiple      Live Births               Home Medications    Prior to Admission medications   Medication Sig Start Date End Date Taking? Authorizing Provider  methocarbamol (ROBAXIN) 500 MG tablet Take 1 tablet (500 mg total) by mouth 2 (two) times daily as needed for muscle spasms. 12/10/21  Yes Sharion Balloon, NP  doxycycline (VIBRAMYCIN) 100 MG capsule Take 1 capsule (100 mg total) by mouth 2 (two) times daily. 10/10/21   Rodriguez-Southworth, Sunday Spillers, PA-C  fluconazole (DIFLUCAN) 150 MG tablet Take  1 tablet (150 mg total) by mouth daily. 10/10/21   Rodriguez-Southworth, Sunday Spillers, PA-C  gabapentin (NEURONTIN) 300 MG capsule Take 1 capsule (300 mg total) by mouth 3 (three) times daily. 12/06/19   Lomax, Amy, NP  ibuprofen (ADVIL,MOTRIN) 800 MG tablet Take 1 tablet (800 mg total) by mouth every 8 (eight) hours as needed. 12/31/17   Tyson Dense, MD  levothyroxine (SYNTHROID) 75 MCG tablet levothyroxine 75 mcg tablet    [provider]  nitrofurantoin, macrocrystal-monohydrate, (MACROBID) 100 MG capsule Take 1 capsule (100 mg total) by mouth 2 (two) times daily. 10/10/21   Rodriguez-Southworth, Sunday Spillers, PA-C  ondansetron (ZOFRAN) 4 MG tablet Take 1 tablet (4 mg total) by mouth every 6 (six)  hours. 02/24/20   Wurst, [place], PA-C  phenazopyridine (PYRIDIUM) 200 MG tablet Take 1 tablet (200 mg total) by mouth 3 (three) times daily. 10/10/21   Rodriguez-Southworth, Sunday Spillers, PA-C  rOPINIRole (REQUIP) 3 MG tablet Take 3 mg by mouth at bedtime.    [provider]  ARIPiprazole (ABILIFY) 5 MG tablet Take 1 tablet (5 mg total) by mouth daily. 10/29/17 12/06/19  Clarene Reamer, MD    Family History Family History  Problem Relation Age of Onset   Thyroid disease Mother    Nephrolithiasis Mother    Urolithiasis Mother    Heart attack Mother        signs but neg eval   Nephrolithiasis Father    Urolithiasis Father    Anxiety disorder Father    Drug abuse Father    Thyroid disease Sister    Heart disease Maternal Grandmother     Social History Social History   Tobacco Use   Smoking status: Never   Smokeless tobacco: Never  Vaping Use   Vaping Use: Never used  Substance Use Topics   Alcohol use: Yes    Comment: Rarely   Drug use: No     Allergies   Compazine [prochlorperazine] and Oxycodone   Review of Systems Review of Systems  Constitutional:  Negative for chills and fever.  Respiratory:  Negative for cough and shortness of breath.   Cardiovascular:  Negative for chest pain and palpitations.  Gastrointestinal:  Negative for abdominal pain and vomiting.  Genitourinary:  Negative for dysuria, hematuria, pelvic pain and vaginal discharge.  Musculoskeletal:  Positive for arthralgias and back pain. Negative for gait problem and joint swelling.  Skin:  Negative for color change and rash.  Neurological:  Negative for weakness and numbness.  All other systems reviewed and are negative.   Physical Exam Triage Vital Signs ED Triage Vitals  Enc Vitals Group     BP      Pulse      Resp      Temp      Temp src      SpO2      Weight      Height      Head Circumference      Peak Flow      Pain Score      Pain Loc      Pain Edu?      Excl. in [place]?     No data found.  Updated Vital Signs BP 116/75 (BP Location: Left Arm)    Pulse 85    Temp 98.3 F (36.8 C) (Oral)    Resp 18    LMP  (LMP Unknown)    SpO2 97%   Visual Acuity Right Eye Distance:   Left Eye Distance:  Bilateral Distance:    Right Eye Near:   Left Eye Near:    Bilateral Near:     Physical Exam Vitals and nursing note reviewed.  Constitutional:      General: She is not in acute distress.    Appearance: She is well-developed. She is not ill-appearing.  HENT:     Head: Normocephalic and atraumatic.     Mouth/Throat:     Mouth: Mucous membranes are moist.  Cardiovascular:     Rate and Rhythm: Normal rate and regular rhythm.     Heart sounds: Normal heart sounds.  Pulmonary:     Effort: Pulmonary effort is normal. No respiratory distress.     Breath sounds: Normal breath sounds.  Abdominal:     Palpations: Abdomen is soft.     Tenderness: There is no abdominal tenderness. There is no right CVA tenderness, left CVA tenderness, guarding or rebound.  Musculoskeletal:        General: No swelling, tenderness, deformity or signs of injury.     Cervical back: Neck supple.     Comments: Left shoulder: decreased ROM due to discomfort. Back and shoulder nontender to palpation.    Skin:    General: Skin is warm and dry.     Capillary Refill: Capillary refill takes less than 2 seconds.     Findings: No bruising, erythema, lesion or rash.  Neurological:     General: No focal deficit present.     Mental Status: She is alert and oriented to person, place, and time.     Sensory: No sensory deficit.     Motor: No weakness.     Gait: Gait normal.  Psychiatric:        Mood and Affect: Mood normal.        Behavior: Behavior normal.     UC Treatments / Results  Labs (all labs ordered are listed, but only abnormal results are displayed) Labs Reviewed  POCT URINALYSIS DIP (MANUAL ENTRY)    EKG   Radiology No results found.  Procedures Procedures (including  critical care time)  Medications Ordered in UC Medications  ketorolac (TORADOL) 30 MG/ML injection 30 mg (30 mg Intramuscular Given 12/10/21 1556)    Initial Impression / Assessment and Plan / UC Course  I have reviewed the triage vital signs and the nursing notes.  Pertinent labs & imaging results that were available during my care of the patient were reviewed by me and considered in my medical decision making (see chart for details).    Acute low back pain with sciatica, Left shoulder pain.  Toradol given here.  Also treating with methocarbamol; precautions for drowsiness with this medication discussed.  Instructed patient to follow up with ortho if she is not improving.  She agrees to plan of care.   Final Clinical Impressions(s) / UC Diagnoses   Final diagnoses:  Acute bilateral low back pain with right-sided sciatica  Acute pain of left shoulder     Discharge Instructions      You were given an injection of Toradol today.    Take the muscle relaxer as needed for muscle spasm; Do not drive, operate machinery, or drink alcohol with this medication as it can cause drowsiness.   Follow up with an orthopedist if your symptoms are not improving.         ED Prescriptions     Medication Sig Dispense Auth. Provider   methocarbamol (ROBAXIN) 500 MG tablet Take 1 tablet (500 mg total) by mouth  2 (two) times daily as needed for muscle spasms. 10 tablet Sharion Balloon, NP      I have reviewed the PDMP during this encounter.   Sharion Balloon, NP 12/10/21 1556

## 2021-12-10 NOTE — ED Triage Notes (Signed)
Pt c/o lower Back pain that started last night. She denies any injuries. Her left shoulder has ben hurting x1 week.

## 2021-12-10 NOTE — Discharge Instructions (Addendum)
You were given an injection of Toradol today.    Take the muscle relaxer as needed for muscle spasm; Do not drive, operate machinery, or drink alcohol with this medication as it can cause drowsiness.   Follow up with an orthopedist if your symptoms are not improving.

## 2022-01-26 ENCOUNTER — Ambulatory Visit
Admission: RE | Admit: 2022-01-26 | Discharge: 2022-01-26 | Disposition: A | Discharge status: Home / Self Care | Payer: No Typology Code available for payment source | Source: Ambulatory Visit | Attending: Emergency Medicine | Admitting: Emergency Medicine

## 2022-01-26 ENCOUNTER — Other Ambulatory Visit: Payer: Self-pay

## 2022-01-26 VITALS — BP 134/86 | HR 78 | Temp 98.1°F | Resp 18

## 2022-01-26 DIAGNOSIS — J069 Acute upper respiratory infection, unspecified: Secondary | ICD-10-CM | POA: Diagnosis not present

## 2022-01-26 MED ORDER — ALBUTEROL SULFATE HFA 108 (90 BASE) MCG/ACT IN AERS: 1.0000 | INHALATION_SPRAY | Freq: Four times a day (QID) | RESPIRATORY_TRACT | 0 refills | Status: AC | PRN

## 2022-01-26 MED ORDER — BENZONATATE 100 MG PO CAPS: 100.0000 mg | ORAL_CAPSULE | Freq: Three times a day (TID) | ORAL | 0 refills | Status: DC | PRN

## 2022-01-26 MED ORDER — PREDNISONE 10 MG PO TABS: 40.0000 mg | ORAL_TABLET | Freq: Every day | ORAL | 0 refills | Status: AC

## 2022-01-26 NOTE — ED Triage Notes (Signed)
Pt here with congestion, cough and headache x 2 days.  ?

## 2022-01-26 NOTE — Discharge Instructions (Addendum)
Use the albuterol inhaler and take the prednisone as directed.   ? ?Your COVID, RSV, and Flu tests are pending.  You should self quarantine until the test results are back.   ? ?Take Tylenol or ibuprofen as needed for fever or discomfort.  Rest and keep yourself hydrated.   ? ?Follow-up with your primary care provider if your symptoms are not improving.   ? ? ?

## 2022-01-26 NOTE — ED Provider Notes (Signed)
Erica Larson    CSN: [id number] Arrival date & time: 01/26/22  1132      History   Chief Complaint Chief Complaint  Patient presents with   Cough   Headache   Nasal Congestion    HPI Erica Larson is a 46 y.o. female.  Patient presents with 2-day history of congestion, headache, and cough.  She states her respirations feel "tight."  No fever, rash, ear pain, sore throat, shortness of breath, vomiting, diarrhea, or other symptoms.  Treatment at home with DayQuil and NyQuil.  Her granddaughter was recently diagnosed with RSV.  Her medical history includes hypothyroidism, migraine headaches, bipolar disorder, depression, restless leg syndrome.  The history is provided by the patient and medical records.   Past Medical History:  Diagnosis Date   Acne    Amenorrhea    Anxiety    Bipolar disorder (HCC)    type 2   Depression    Hypothyroidism    Infertility, female    treatment with oligammenorhea  Dr Chevis Pretty    Migraine    Restless legs syndrome    currently no problems per patient   SVD (spontaneous vaginal delivery)    x 1   Thyroiditis     Patient Active Problem List   Diagnosis Date Noted   S/P laparoscopic assisted vaginal hysterectomy (LAVH) 12/29/2017   Restless leg syndrome 11/05/2015   Bipolar 2 disorder, major depressive episode (HCC) 11/04/2015   Anxiety state, unspecified 09/27/2013   Fatigue 01/02/2011   VITAMIN D DEFICIENCY 01/05/2009   Hypercalcemia 01/05/2009   NUMBNESS 01/05/2009   THYROIDITIS 01/02/2009   PALPITATIONS, RECURRENT 01/02/2009   SINUSITIS- ACUTE-NOS 08/15/2008   FEMALE INFERTILITY 07/18/2008   WEIGHT GAIN 07/18/2008   ACUTE TONSILLITIS 12/30/2007   URI 12/30/2007   MIGRAINE HEADACHE 08/12/2007   AMENORRHEA 08/12/2007    Past Surgical History:  Procedure Laterality Date   CYSTOSCOPY  12/29/2017   Procedure: CYSTOSCOPY;  Surgeon: Ranae Pila, MD;  Location: [facility];  Service: Gynecology;;   DILATION AND  CURETTAGE OF UTERUS     LAPAROSCOPIC VAGINAL HYSTERECTOMY WITH SALPINGECTOMY Bilateral 12/29/2017   Procedure: LAPAROSCOPIC ASSISTED VAGINAL HYSTERECTOMY WITH SALPINGECTOMY, mccalls culdoplasty;  Surgeon: Ranae Pila, MD;  Location: [facility];  Service: Gynecology;  Laterality: Bilateral;   WISDOM TOOTH EXTRACTION      OB History     Gravida  3   Para  1   Term      Preterm  1   AB  2   Living  1      SAB      IAB      Ectopic  2   Multiple      Live Births               Home Medications    Prior to Admission medications   Medication Sig Start Date End Date Taking? Authorizing Provider  albuterol (VENTOLIN HFA) 108 (90 Base) MCG/ACT inhaler Inhale 1-2 puffs into the lungs every 6 (six) hours as needed for wheezing or shortness of breath. 01/26/22  Yes Mickie Bail, NP  benzonatate (TESSALON) 100 MG capsule Take 1 capsule (100 mg total) by mouth 3 (three) times daily as needed for cough. 01/26/22  Yes Mickie Bail, NP  predniSONE (DELTASONE) 10 MG tablet Take 4 tablets (40 mg total) by mouth daily for 5 days. 01/26/22 01/31/22 Yes Mickie Bail, NP  gabapentin (NEURONTIN) 300 MG capsule Take 1 capsule (300  mg total) by mouth 3 (three) times daily. 12/06/19   Lomax, Amy, NP  ibuprofen (ADVIL,MOTRIN) 800 MG tablet Take 1 tablet (800 mg total) by mouth every 8 (eight) hours as needed. 12/31/17   Ranae Pila, MD  levothyroxine (SYNTHROID) 75 MCG tablet levothyroxine 75 mcg tablet    [provider]  methocarbamol (ROBAXIN) 500 MG tablet Take 1 tablet (500 mg total) by mouth 2 (two) times daily as needed for muscle spasms. 12/10/21   Mickie Bail, NP  nitrofurantoin, macrocrystal-monohydrate, (MACROBID) 100 MG capsule Take 1 capsule (100 mg total) by mouth 2 (two) times daily. 10/10/21   Rodriguez-Southworth, Nettie Elm, PA-C  ondansetron (ZOFRAN) 4 MG tablet Take 1 tablet (4 mg total) by mouth every 6 (six) hours. 02/24/20   Wurst, [place], PA-C   phenazopyridine (PYRIDIUM) 200 MG tablet Take 1 tablet (200 mg total) by mouth 3 (three) times daily. 10/10/21   Rodriguez-Southworth, Nettie Elm, PA-C  rOPINIRole (REQUIP) 3 MG tablet Take 3 mg by mouth at bedtime.    [provider]  ARIPiprazole (ABILIFY) 5 MG tablet Take 1 tablet (5 mg total) by mouth daily. 10/29/17 12/06/19  Benjaman Pott, MD    Family History Family History  Problem Relation Age of Onset   Thyroid disease Mother    Nephrolithiasis Mother    Urolithiasis Mother    Heart attack Mother        signs but neg eval   Nephrolithiasis Father    Urolithiasis Father    Anxiety disorder Father    Drug abuse Father    Thyroid disease Sister    Heart disease Maternal Grandmother     Social History Social History   Tobacco Use   Smoking status: Never   Smokeless tobacco: Never  Vaping Use   Vaping Use: Never used  Substance Use Topics   Alcohol use: Yes    Comment: Rarely   Drug use: No     Allergies   Compazine [prochlorperazine] and Oxycodone   Review of Systems Review of Systems  Constitutional:  Negative for chills and fever.  HENT:  Positive for congestion. Negative for ear pain and sore throat.   Respiratory:  Positive for cough. Negative for shortness of breath.   Cardiovascular:  Negative for chest pain and palpitations.  Skin:  Negative for color change and rash.  Neurological:  Positive for headaches.  All other systems reviewed and are negative.   Physical Exam Triage Vital Signs ED Triage Vitals  Enc Vitals Group     BP 01/26/22 1140 134/86     Pulse Rate 01/26/22 1140 78     Resp 01/26/22 1140 18     Temp 01/26/22 1140 98.1 F (36.7 C)     Temp src --      SpO2 01/26/22 1140 97 %     Weight --      Height --      Head Circumference --      Peak Flow --      Pain Score 01/26/22 1139 0     Pain Loc --      Pain Edu? --      Excl. in GC? --    No data found.  Updated Vital Signs BP 134/86    Pulse 78    Temp 98.1 F  (36.7 C)    Resp 18    LMP  (LMP Unknown)    SpO2 97%   Visual Acuity Right Eye Distance:   Left Eye  Distance:   Bilateral Distance:    Right Eye Near:   Left Eye Near:    Bilateral Near:     Physical Exam Vitals and nursing note reviewed.  Constitutional:      General: She is not in acute distress.    Appearance: She is well-developed. She is not ill-appearing.  HENT:     Right Ear: Tympanic membrane normal.     Left Ear: Tympanic membrane normal.     Nose: Nose normal.     Mouth/Throat:     Mouth: Mucous membranes are moist.     Pharynx: Oropharynx is clear.  Cardiovascular:     Rate and Rhythm: Normal rate and regular rhythm.     Heart sounds: Normal heart sounds.  Pulmonary:     Effort: Pulmonary effort is normal. No respiratory distress.     Breath sounds: Normal breath sounds.  Musculoskeletal:     Cervical back: Neck supple.  Skin:    General: Skin is warm and dry.  Neurological:     Mental Status: She is alert.  Psychiatric:        Mood and Affect: Mood normal.        Behavior: Behavior normal.     UC Treatments / Results  Labs (all labs ordered are listed, but only abnormal results are displayed) Labs Reviewed  COVID-19, FLU A+B AND RSV    EKG   Radiology No results found.  Procedures Procedures (including critical care time)  Medications Ordered in UC Medications - No data to display  Initial Impression / Assessment and Plan / UC Course  I have reviewed the triage vital signs and the nursing notes.  Pertinent labs & imaging results that were available during my care of the patient were reviewed by me and considered in my medical decision making (see chart for details).   Viral URI with cough.  COVID, Flu, RSV pending.  Instructed patient to self quarantine per CDC guidelines.  Treating with albuterol inhaler and prednisone.  Treating cough with Tessalon Perles.  Discussed symptomatic treatment including Tylenol or ibuprofen, rest, hydration.   Instructed patient to follow up with PCP if symptoms are not improving.  Patient agrees to plan of care.    Final Clinical Impressions(s) / UC Diagnoses   Final diagnoses:  Viral URI with cough     Discharge Instructions      Use the albuterol inhaler and take the prednisone as directed.    Your COVID, RSV, and Flu tests are pending.  You should self quarantine until the test results are back.    Take Tylenol or ibuprofen as needed for fever or discomfort.  Rest and keep yourself hydrated.    Follow-up with your primary care provider if your symptoms are not improving.         ED Prescriptions     Medication Sig Dispense Auth. Provider   albuterol (VENTOLIN HFA) 108 (90 Base) MCG/ACT inhaler Inhale 1-2 puffs into the lungs every 6 (six) hours as needed for wheezing or shortness of breath. 18 g Mickie Bail, NP   predniSONE (DELTASONE) 10 MG tablet Take 4 tablets (40 mg total) by mouth daily for 5 days. 20 tablet Mickie Bail, NP   benzonatate (TESSALON) 100 MG capsule Take 1 capsule (100 mg total) by mouth 3 (three) times daily as needed for cough. 21 capsule Mickie Bail, NP      PDMP not reviewed this encounter.   Mickie Bail, NP 01/26/22  1210 ° °

## 2022-01-27 LAB — COVID-19, FLU A+B AND RSV
Influenza A, NAA: NOT DETECTED
Influenza B, NAA: NOT DETECTED
RSV, NAA: NOT DETECTED
SARS-CoV-2, NAA: NOT DETECTED

## 2022-02-25 ENCOUNTER — Ambulatory Visit: Payer: Self-pay

## 2022-02-26 ENCOUNTER — Ambulatory Visit
Admission: RE | Admit: 2022-02-26 | Discharge: 2022-02-26 | Disposition: A | Discharge status: Home / Self Care | Payer: No Typology Code available for payment source | Source: Ambulatory Visit | Attending: Student | Admitting: Student

## 2022-02-26 VITALS — BP 132/86 | HR 99 | Temp 99.0°F | Resp 18

## 2022-02-26 DIAGNOSIS — H66002 Acute suppurative otitis media without spontaneous rupture of ear drum, left ear: Secondary | ICD-10-CM | POA: Diagnosis not present

## 2022-02-26 DIAGNOSIS — J208 Acute bronchitis due to other specified organisms: Secondary | ICD-10-CM | POA: Diagnosis not present

## 2022-02-26 LAB — POCT URINALYSIS DIP (MANUAL ENTRY)
Bilirubin, UA: NEGATIVE
Blood, UA: NEGATIVE
Glucose, UA: NEGATIVE mg/dL
Ketones, POC UA: NEGATIVE mg/dL
Leukocytes, UA: NEGATIVE
Nitrite, UA: NEGATIVE
Protein Ur, POC: NEGATIVE mg/dL
Spec Grav, UA: 1.02 (ref 1.010–1.025)
Urobilinogen, UA: 0.2 E.U./dL
pH, UA: 7.5 (ref 5.0–8.0)

## 2022-02-26 LAB — POCT RAPID STREP A (OFFICE): Rapid Strep A Screen: NEGATIVE

## 2022-02-26 MED ORDER — FLUCONAZOLE 150 MG PO TABS: 150.0000 mg | ORAL_TABLET | Freq: Every day | ORAL | 0 refills | Status: AC

## 2022-02-26 MED ORDER — AMOXICILLIN-POT CLAVULANATE 875-125 MG PO TABS: 1.0000 | ORAL_TABLET | Freq: Two times a day (BID) | ORAL | 0 refills | Status: DC

## 2022-02-26 NOTE — ED Triage Notes (Signed)
Pt presents with cough, ST and left ear pain x 2 days. She also has foul smelling urine since today  ?

## 2022-02-26 NOTE — Discharge Instructions (Addendum)
-  Start the antibiotic-Augmentin (amoxicillin-clavulanate), 1 pill every 12 hours for 7 days.  You can take this with food like with breakfast and dinner. ?-For your yeast infection, start the Diflucan (fluconazole)- Take one pill today (day 1). If you're still having symptoms in 3 days, take the second pill.  ?-Albuterol inhaler as needed for cough, wheezing, shortness of breath, 1 to 2 puffs every 6 hours as needed. ?-Follow-up with PCP to discuss recurrent upper respiratory infections ?-Seek additional medical attention if new/worsening shortness of breath, fevers, chest pain, etc ? ?

## 2022-02-26 NOTE — ED Provider Notes (Signed)
?[facility] ? ? ? ?CSN: [id number] ?Arrival date & time: 02/26/22  1807 ? ? ?  ? ?History   ?Chief Complaint ?Chief Complaint  ?Patient presents with  ? Cough  ?  Congestion, sore throat, eye ache - Entered by patient  ? Sore Throat  ? Otalgia  ? ? ?HPI ?Erica Larson is a 46 y.o. female presenting with cough, sore throat, ear pain x2 days. History of frequent viral syndromes for the last 6 months per patient.  She does not have a formal diagnosis of asthma.  Describes 2 days of hacking nonproductive cough.  This is not associated with shortness of breath, chest pain, dizziness, fever/chills.  Also with left ear pain, without dizziness, hearing changes, tinnitus.  States her urine has been malodorous today, without other urinary symptoms including dysuria, hematuria, frequency, abdominal pain, flank pain.  Has attempted various over-the-counter cough medications without relief.  Has not followed with her PCP for her repeated respiratory infections.  Never smoker. ? ?HPI ? ?Past Medical History:  ?Diagnosis Date  ? Acne   ? Amenorrhea   ? Anxiety   ? Bipolar disorder (HCC)   ? type 2  ? Depression   ? Hypothyroidism   ? Infertility, female   ? treatment with oligammenorhea  Dr Chevis Pretty   ? Migraine   ? Restless legs syndrome   ? currently no problems per patient  ? SVD (spontaneous vaginal delivery)   ? x 1  ? Thyroiditis   ? ? ?Patient Active Problem List  ? Diagnosis Date Noted  ? S/P laparoscopic assisted vaginal hysterectomy (LAVH) 12/29/2017  ? Restless leg syndrome 11/05/2015  ? Bipolar 2 disorder, major depressive episode (HCC) 11/04/2015  ? Anxiety state, unspecified 09/27/2013  ? Fatigue 01/02/2011  ? VITAMIN D DEFICIENCY 01/05/2009  ? Hypercalcemia 01/05/2009  ? NUMBNESS 01/05/2009  ? THYROIDITIS 01/02/2009  ? PALPITATIONS, RECURRENT 01/02/2009  ? SINUSITIS- ACUTE-NOS 08/15/2008  ? FEMALE INFERTILITY 07/18/2008  ? WEIGHT GAIN 07/18/2008  ? ACUTE TONSILLITIS 12/30/2007  ? URI 12/30/2007  ? MIGRAINE  HEADACHE 08/12/2007  ? AMENORRHEA 08/12/2007  ? ? ?Past Surgical History:  ?Procedure Laterality Date  ? CYSTOSCOPY  12/29/2017  ? Procedure: CYSTOSCOPY;  Surgeon: Ranae Pila, MD;  Location: [facility];  Service: Gynecology;;  ? DILATION AND CURETTAGE OF UTERUS    ? LAPAROSCOPIC VAGINAL HYSTERECTOMY WITH SALPINGECTOMY Bilateral 12/29/2017  ? Procedure: LAPAROSCOPIC ASSISTED VAGINAL HYSTERECTOMY WITH SALPINGECTOMY, mccalls culdoplasty;  Surgeon: Ranae Pila, MD;  Location: [facility];  Service: Gynecology;  Laterality: Bilateral;  ? WISDOM TOOTH EXTRACTION    ? ? ?OB History   ? ? Gravida  ?3  ? Para  ?1  ? Term  ?   ? Preterm  ?1  ? AB  ?2  ? Living  ?1  ?  ? ? SAB  ?   ? IAB  ?   ? Ectopic  ?2  ? Multiple  ?   ? Live Births  ?   ?   ?  ?  ? ? ? ?Home Medications   ? ?Prior to Admission medications   ?Medication Sig Start Date End Date Taking? Authorizing Provider  ?amoxicillin-clavulanate (AUGMENTIN) 875-125 MG tablet Take 1 tablet by mouth every 12 (twelve) hours. 02/26/22  Yes Rhys Martini, PA-C  ?fluconazole (DIFLUCAN) 150 MG tablet Take 1 tablet (150 mg total) by mouth daily. -For your yeast infection, start the Diflucan (fluconazole)- Take one pill today (day 1). If you're  still having symptoms in 3 days, take the second pill. 02/26/22  Yes Rhys Martini, PA-C  ?gabapentin (NEURONTIN) 300 MG capsule Take 1 capsule (300 mg total) by mouth 3 (three) times daily. 12/06/19  Yes Lomax, Amy, NP  ?levothyroxine (SYNTHROID) 75 MCG tablet levothyroxine 75 mcg tablet   Yes [provider]  ?rOPINIRole (REQUIP) 3 MG tablet Take 3 mg by mouth at bedtime.   Yes [provider]  ?albuterol (VENTOLIN HFA) 108 (90 Base) MCG/ACT inhaler Inhale 1-2 puffs into the lungs every 6 (six) hours as needed for wheezing or shortness of breath. 01/26/22   Mickie Bail, NP  ?benzonatate (TESSALON) 100 MG capsule Take 1 capsule (100 mg total) by mouth 3 (three) times daily as needed for cough. 01/26/22   Mickie Bail, NP  ?ibuprofen (ADVIL,MOTRIN) 800 MG tablet Take 1 tablet (800 mg total) by mouth every 8 (eight) hours as needed. 12/31/17   Ranae Pila, MD  ?methocarbamol (ROBAXIN) 500 MG tablet Take 1 tablet (500 mg total) by mouth 2 (two) times daily as needed for muscle spasms. 12/10/21   Mickie Bail, NP  ?nitrofurantoin, macrocrystal-monohydrate, (MACROBID) 100 MG capsule Take 1 capsule (100 mg total) by mouth 2 (two) times daily. 10/10/21   Rodriguez-Southworth, Nettie Elm, PA-C  ?ondansetron (ZOFRAN) 4 MG tablet Take 1 tablet (4 mg total) by mouth every 6 (six) hours. 02/24/20   Wurst, [place], PA-C  ?phenazopyridine (PYRIDIUM) 200 MG tablet Take 1 tablet (200 mg total) by mouth 3 (three) times daily. 10/10/21   Rodriguez-Southworth, Nettie Elm, PA-C  ?ARIPiprazole (ABILIFY) 5 MG tablet Take 1 tablet (5 mg total) by mouth daily. 10/29/17 12/06/19  Benjaman Pott, MD  ? ? ?Family History ?Family History  ?Problem Relation Age of Onset  ? Thyroid disease Mother   ? Nephrolithiasis Mother   ? Urolithiasis Mother   ? Heart attack Mother   ?     signs but neg eval  ? Nephrolithiasis Father   ? Urolithiasis Father   ? Anxiety disorder Father   ? Drug abuse Father   ? Thyroid disease Sister   ? Heart disease Maternal Grandmother   ? ? ?Social History ?Social History  ? ?Tobacco Use  ? Smoking status: Never  ? Smokeless tobacco: Never  ?Vaping Use  ? Vaping Use: Never used  ?Substance Use Topics  ? Alcohol use: Yes  ?  Comment: Rarely  ? Drug use: No  ? ? ? ?Allergies   ?Compazine [prochlorperazine] and Oxycodone ? ? ?Review of Systems ?Review of Systems  ?Constitutional:  Negative for appetite change, chills and fever.  ?HENT:  Positive for congestion and ear pain. Negative for rhinorrhea, sinus pressure, sinus pain and sore throat.   ?Eyes:  Negative for redness and visual disturbance.  ?Respiratory:  Positive for cough. Negative for chest tightness, shortness of breath and wheezing.   ?Cardiovascular:  Negative for  chest pain and palpitations.  ?Gastrointestinal:  Negative for abdominal pain, constipation, diarrhea, nausea and vomiting.  ?Genitourinary:  Negative for dysuria, frequency and urgency.  ?Musculoskeletal:  Negative for myalgias.  ?Neurological:  Negative for dizziness, weakness and headaches.  ?Psychiatric/Behavioral:  Negative for confusion.   ?All other systems reviewed and are negative. ? ? ?Physical Exam ?Triage Vital Signs ?ED Triage Vitals  ?Enc Vitals Group  ?   BP 02/26/22 1827 132/86  ?   Pulse Rate 02/26/22 1827 99  ?   Resp 02/26/22 1827 18  ?   Temp  02/26/22 1827 99 ?F (37.2 ?C)  ?   Temp Source 02/26/22 1827 Oral  ?   SpO2 02/26/22 1827 95 %  ?   Weight --   ?   Height --   ?   Head Circumference --   ?   Peak Flow --   ?   Pain Score 02/26/22 1820 5  ?   Pain Loc --   ?   Pain Edu? --   ?   Excl. in GC? --   ? ?No data found. ? ?Updated Vital Signs ?BP 132/86 (BP Location: Left Arm)   Pulse 99   Temp 99 ?F (37.2 ?C) (Oral)   Resp 18   LMP  (LMP Unknown)   SpO2 95%  ? ?Visual Acuity ?Right Eye Distance:   ?Left Eye Distance:   ?Bilateral Distance:   ? ?Right Eye Near:   ?Left Eye Near:    ?Bilateral Near:    ? ?Physical Exam ?Vitals reviewed.  ?Constitutional:   ?   General: She is not in acute distress. ?   Appearance: Normal appearance. She is not ill-appearing.  ?HENT:  ?   Head: Normocephalic and atraumatic.  ?   Right Ear: Tympanic membrane, ear canal and external ear normal. No tenderness. No middle ear effusion. There is no impacted cerumen. Tympanic membrane is not perforated, erythematous, retracted or bulging.  ?   Left Ear: Ear canal and external ear normal. No tenderness.  No middle ear effusion. There is no impacted cerumen. Tympanic membrane is erythematous and bulging. Tympanic membrane is not perforated or retracted.  ?   Nose: Nose normal. No congestion.  ?   Mouth/Throat:  ?   Mouth: Mucous membranes are moist.  ?   Pharynx: Uvula midline. No oropharyngeal exudate or posterior  oropharyngeal erythema.  ?Eyes:  ?   Extraocular Movements: Extraocular movements intact.  ?   Pupils: Pupils are equal, round, and reactive to light.  ?Cardiovascular:  ?   Rate and Rhythm: Normal rate and r

## 2022-03-01 ENCOUNTER — Ambulatory Visit: Payer: No Typology Code available for payment source

## 2022-03-06 ENCOUNTER — Ambulatory Visit (INDEPENDENT_AMBULATORY_CARE_PROVIDER_SITE_OTHER): Payer: No Typology Code available for payment source

## 2022-03-06 ENCOUNTER — Ambulatory Visit
Admission: EM | Admit: 2022-03-06 | Discharge: 2022-03-06 | Disposition: A | Discharge status: Home / Self Care | Payer: No Typology Code available for payment source | Attending: Family Medicine | Admitting: Family Medicine

## 2022-03-06 DIAGNOSIS — R059 Cough, unspecified: Secondary | ICD-10-CM

## 2022-03-06 DIAGNOSIS — R062 Wheezing: Secondary | ICD-10-CM

## 2022-03-06 DIAGNOSIS — R051 Acute cough: Secondary | ICD-10-CM

## 2022-03-06 MED ORDER — BENZONATATE 100 MG PO CAPS: 100.0000 mg | ORAL_CAPSULE | Freq: Three times a day (TID) | ORAL | 0 refills | Status: DC | PRN

## 2022-03-06 MED ORDER — PREDNISONE 10 MG (48) PO TBPK: ORAL_TABLET | ORAL | 0 refills | Status: DC

## 2022-03-06 NOTE — ED Triage Notes (Signed)
Pt reports coughing red bright blood, sinus pressure and shortness of breath x 1 week. Reports she finished amoxicillin without improved.  ?

## 2022-03-09 NOTE — ED Provider Notes (Signed)
?[facility] ? ? ?[id number] ?03/06/22 Arrival Time: 1623 ? ?ASSESSMENT & PLAN: ? ?1. Acute cough   ?2. Wheezing   ? ?I have personally viewed the imaging studies ordered this visit. ?No worrisome findings; mild bronchitic changes. No PNA. ? ?Likely viral etiology. With wheezing, begin: ?Meds ordered this encounter  ?Medications  ? predniSONE (STERAPRED UNI-PAK 48 TAB) 10 MG (48) TBPK tablet  ?  Sig: Take as directed.  ?  Dispense:  48 tablet  ?  Refill:  0  ? benzonatate (TESSALON) 100 MG capsule  ?  Sig: Take 1 capsule (100 mg total) by mouth 3 (three) times daily as needed for cough.  ?  Dispense:  21 capsule  ?  Refill:  0  ? ? ? ? ? Follow-up Information   ? ? [facility], [facility]   ?Why: As needed. ?Contact information: ?[address] ?[place] [address] ?[phone] ? ? ?  ?  ? ? Schedule an appointment as soon as possible for a visit  with [facility].   ?Contact information: ?[address] ?[address] ?[facility] [place] [address] ? ?  ?  ? ?  ?  ? ?  ? ? ?Reviewed expectations re: course of current medical issues. Questions answered. ?Outlined signs and symptoms indicating need for more acute intervention. ?Understanding verbalized. ?After Visit Summary given. ? ? ?SUBJECTIVE: ?History from: Patient. ?Erica Larson is a 46 y.o. female. Reports: persistent cough; at least a week, ques longer. Occasionally feels winded; ques wheezing at times. Is completing a course of amoxicillin for prev dx PNA. No fever reported. Cough is keeping her awake at night. Denies: headache. Normal PO intake without n/v/d. ? ?OBJECTIVE: ? ?Vitals:  ? 03/06/22 1653  ?BP: 128/83  ?Pulse: 100  ?Resp: 18  ?Temp: 98 ?F (36.7 ?C)  ?TempSrc: Oral  ?SpO2: 98%  ?  ?General appearance: alert; no distress ?Eyes: PERRLA; EOMI; conjunctiva normal ?HENT: [place]; AT; with nasal congestion ?Neck: supple  ?Lungs: speaks full sentences without difficulty; unlabored; mild bilat wheezing ?Extremities: no  edema ?Skin: warm and dry ?Neurologic: normal gait ?Psychological: alert and cooperative; normal mood and affect ? ?Imaging: ?DG Chest 2 View ? ?Result Date: 03/06/2022 ?CLINICAL DATA:  Cough for 1 week. Shortness of breath. History of pneumonia. EXAM: CHEST - 2 VIEW COMPARISON:  Chest radiograph 09/30/2013 FINDINGS: The cardiomediastinal silhouette is unchanged with normal heart size. The lungs are well inflated. There is new mild diffuse airway thickening. No focal airspace consolidation, overt pulmonary edema, pleural effusion, or pneumothorax is identified. No acute osseous abnormality is seen. IMPRESSION: Airway thickening which may reflect bronchitis. Electronically Signed   By: Sebastian Ache M.D.   On: 03/06/2022 16:59  ? ? ?Allergies  ?Allergen Reactions  ? Compazine [Prochlorperazine] Other (See Comments)  ?  Legs jumping   ? Hydrocodone   ? Oxycodone Itching  ? ? ?Past Medical History:  ?Diagnosis Date  ? Acne   ? Amenorrhea   ? Anxiety   ? Bipolar disorder (HCC)   ? type 2  ? Depression   ? Hypothyroidism   ? Infertility, female   ? treatment with oligammenorhea  Dr Chevis Pretty   ? Migraine   ? Restless legs syndrome   ? currently no problems per patient  ? SVD (spontaneous vaginal delivery)   ? x 1  ? Thyroiditis   ? ?Social History  ? ?Socioeconomic History  ? Marital status: Married  ?  Spouse name: Elige Radon  ? Number of children: 1  ? Years of education: Some college  ? Highest education level: Not on file  ?Occupational History  ? Occupation: Network engineer  ? Occupation: Private sitting  ?Tobacco Use  ? Smoking status: Never  ? Smokeless tobacco: Never  ?Vaping Use  ? Vaping Use: Never used  ?Substance and Sexual Activity  ? Alcohol use: Yes  ?  Comment: Rarely  ? Drug use: No  ? Sexual activity: Yes  ?  Birth control/protection: None  ?Other Topics Concern  ? Not on file  ?Social History Narrative  ? Lives with husband  ? [employer] 3 married non smoker    ? Daughter husband and pet dog  ? Caffeine use: Diet coke   ? ?Social Determinants of Health  ? ?Financial Resource Strain: Not on file  ?Food Insecurity: Not on file  ?Transportation Needs: Not on file  ?Physical Activity: Not on file  ?Stress: Not on file  ?Social Connections: Not on file  ?Intimate Partner Violence: Not on file  ? ?Family History  ?Problem Relation Age of Onset  ? Thyroid disease Mother   ? Nephrolithiasis Mother   ? Urolithiasis Mother   ? Heart attack Mother   ?     signs but neg eval  ? Nephrolithiasis Father   ? Urolithiasis Father   ? Anxiety disorder Father   ? Drug abuse Father   ? Thyroid disease Sister   ? Heart disease Maternal Grandmother   ? ?Past Surgical History:  ?Procedure Laterality Date  ? CYSTOSCOPY  12/29/2017  ? Procedure: CYSTOSCOPY;  Surgeon: Ranae Pila, MD;  Location: [facility];  Service: Gynecology;;  ? DILATION AND CURETTAGE OF UTERUS    ? LAPAROSCOPIC VAGINAL HYSTERECTOMY WITH SALPINGECTOMY Bilateral 12/29/2017  ? Procedure: LAPAROSCOPIC ASSISTED VAGINAL HYSTERECTOMY WITH SALPINGECTOMY, mccalls culdoplasty;  Surgeon: Ranae Pila, MD;  Location: [facility];  Service: Gynecology;  Laterality: Bilateral;  ? WISDOM TOOTH EXTRACTION    ? ?  ?Mardella Layman, MD ?03/09/22 1024 ? ?

## 2022-03-12 ENCOUNTER — Emergency Department (HOSPITAL_COMMUNITY): Payer: No Typology Code available for payment source

## 2022-03-12 ENCOUNTER — Encounter (HOSPITAL_COMMUNITY): Payer: Self-pay

## 2022-03-12 ENCOUNTER — Emergency Department (HOSPITAL_COMMUNITY)
Admission: EM | Admit: 2022-03-12 | Discharge: 2022-03-12 | Disposition: A | Discharge status: Home / Self Care | Payer: No Typology Code available for payment source | Attending: Emergency Medicine | Admitting: Emergency Medicine

## 2022-03-12 ENCOUNTER — Other Ambulatory Visit: Payer: Self-pay

## 2022-03-12 DIAGNOSIS — N3001 Acute cystitis with hematuria: Secondary | ICD-10-CM | POA: Diagnosis not present

## 2022-03-12 DIAGNOSIS — R109 Unspecified abdominal pain: Secondary | ICD-10-CM | POA: Diagnosis not present

## 2022-03-12 DIAGNOSIS — Z79899 Other long term (current) drug therapy: Secondary | ICD-10-CM | POA: Insufficient documentation

## 2022-03-12 DIAGNOSIS — E039 Hypothyroidism, unspecified: Secondary | ICD-10-CM | POA: Diagnosis not present

## 2022-03-12 DIAGNOSIS — N201 Calculus of ureter: Secondary | ICD-10-CM | POA: Diagnosis not present

## 2022-03-12 DIAGNOSIS — N1330 Unspecified hydronephrosis: Secondary | ICD-10-CM | POA: Diagnosis not present

## 2022-03-12 DIAGNOSIS — Z20822 Contact with and (suspected) exposure to covid-19: Secondary | ICD-10-CM | POA: Insufficient documentation

## 2022-03-12 DIAGNOSIS — D72829 Elevated white blood cell count, unspecified: Secondary | ICD-10-CM | POA: Diagnosis not present

## 2022-03-12 DIAGNOSIS — R1032 Left lower quadrant pain: Secondary | ICD-10-CM | POA: Diagnosis present

## 2022-03-12 DIAGNOSIS — Z90710 Acquired absence of both cervix and uterus: Secondary | ICD-10-CM | POA: Diagnosis not present

## 2022-03-12 LAB — LIPASE, BLOOD: Lipase: 21 U/L (ref 11–51)

## 2022-03-12 LAB — URINALYSIS, ROUTINE W REFLEX MICROSCOPIC
Bilirubin Urine: NEGATIVE
Glucose, UA: NEGATIVE mg/dL
Ketones, ur: NEGATIVE mg/dL
Nitrite: POSITIVE — AB
Protein, ur: 100 mg/dL — AB
RBC / HPF: >50 RBC/hpf — ABNORMAL HIGH (ref 0–5)
Specific Gravity, Urine: 1.017 (ref 1.005–1.030)
WBC, UA: >50 WBC/hpf — ABNORMAL HIGH (ref 0–5)
pH: 5 (ref 5.0–8.0)

## 2022-03-12 LAB — CBC
HCT: 38.2 % (ref 36.0–46.0)
Hemoglobin: 12.7 g/dL (ref 12.0–15.0)
MCH: 29.3 pg (ref 26.0–34.0)
MCHC: 33.2 g/dL (ref 30.0–36.0)
MCV: 88 fL (ref 80.0–100.0)
Platelets: 348 10*3/uL (ref 150–400)
RBC: 4.34 MIL/uL (ref 3.87–5.11)
RDW: 13.2 % (ref 11.5–15.5)
WBC: 10.7 10*3/uL — ABNORMAL HIGH (ref 4.0–10.5)
nRBC: 0 % (ref 0.0–0.2)

## 2022-03-12 LAB — COMPREHENSIVE METABOLIC PANEL
ALT: 32 U/L (ref 0–44)
AST: 26 U/L (ref 15–41)
Albumin: 4 g/dL (ref 3.5–5.0)
Alkaline Phosphatase: 80 U/L (ref 38–126)
Anion gap: 7 (ref 5–15)
BUN: 13 mg/dL (ref 6–20)
CO2: 25 mmol/L (ref 22–32)
Calcium: 10.3 mg/dL (ref 8.9–10.3)
Chloride: 107 mmol/L (ref 98–111)
Creatinine, Ser: 0.65 mg/dL (ref 0.44–1.00)
GFR, Estimated: >60 mL/min (ref >60)
Glucose, Bld: 109 mg/dL — ABNORMAL HIGH (ref 70–99)
Potassium: 3.8 mmol/L (ref 3.5–5.1)
Sodium: 139 mmol/L (ref 135–145)
Total Bilirubin: 0.3 mg/dL (ref 0.3–1.2)
Total Protein: 7.7 g/dL (ref 6.5–8.1)

## 2022-03-12 LAB — RESP PANEL BY RT-PCR (FLU A&B, COVID) ARPGX2
Influenza A by PCR: NEGATIVE
Influenza B by PCR: NEGATIVE
SARS Coronavirus 2 by RT PCR: NEGATIVE

## 2022-03-12 MED ORDER — TAMSULOSIN HCL 0.4 MG PO CAPS: 0.4000 mg | ORAL_CAPSULE | Freq: Every day | ORAL | 0 refills | Status: AC

## 2022-03-12 MED ORDER — ONDANSETRON 4 MG PO TBDP
4.0000 mg | ORAL_TABLET | Freq: Once | ORAL | Status: AC
  Administered 2022-03-12: 4 mg via ORAL
  Filled 2022-03-12: qty 1

## 2022-03-12 MED ORDER — CEFPODOXIME PROXETIL 200 MG PO TABS: 200.0000 mg | ORAL_TABLET | Freq: Two times a day (BID) | ORAL | 0 refills | Status: AC

## 2022-03-12 MED ORDER — HYDROMORPHONE HCL 1 MG/ML IJ SOLN
0.5000 mg | Freq: Once | INTRAMUSCULAR | Status: AC
  Administered 2022-03-12: 0.5 mg via INTRAVENOUS
  Filled 2022-03-12: qty 1

## 2022-03-12 MED ORDER — LACTATED RINGERS IV BOLUS: 1000.0000 mL | Freq: Once | INTRAVENOUS | Status: DC

## 2022-03-12 MED ORDER — TRAMADOL HCL 50 MG PO TABS
100.0000 mg | ORAL_TABLET | Freq: Four times a day (QID) | ORAL | 0 refills | Status: DC | PRN
Start: 2022-03-12 — End: 2022-07-19

## 2022-03-12 MED ORDER — ONDANSETRON HCL 4 MG/2ML IJ SOLN
4.0000 mg | Freq: Once | INTRAMUSCULAR | Status: AC
  Administered 2022-03-12: 4 mg via INTRAVENOUS
  Filled 2022-03-12: qty 2

## 2022-03-12 MED ORDER — TRAMADOL HCL 50 MG PO TABS
50.0000 mg | ORAL_TABLET | Freq: Once | ORAL | Status: AC
  Administered 2022-03-12: 50 mg via ORAL
  Filled 2022-03-12: qty 1

## 2022-03-12 MED ORDER — KETOROLAC TROMETHAMINE 15 MG/ML IJ SOLN
15.0000 mg | Freq: Once | INTRAMUSCULAR | Status: AC
  Administered 2022-03-12: 15 mg via INTRAVENOUS
  Filled 2022-03-12: qty 1

## 2022-03-12 MED ORDER — DIPHENHYDRAMINE HCL 50 MG/ML IJ SOLN
12.5000 mg | Freq: Once | INTRAMUSCULAR | Status: AC
  Administered 2022-03-12: 12.5 mg via INTRAVENOUS
  Filled 2022-03-12: qty 1

## 2022-03-12 MED ORDER — ONDANSETRON 4 MG PO TBDP: 4.0000 mg | ORAL_TABLET | Freq: Three times a day (TID) | ORAL | 0 refills | Status: DC | PRN

## 2022-03-12 MED ORDER — SODIUM CHLORIDE 0.9 % IV BOLUS
1000.0000 mL | Freq: Once | INTRAVENOUS | Status: AC
  Administered 2022-03-12: 1000 mL via INTRAVENOUS

## 2022-03-12 MED ORDER — HYDROMORPHONE HCL 1 MG/ML IJ SOLN
0.5000 mg | Freq: Once | INTRAMUSCULAR | Status: AC
  Administered 2022-03-12: 0.5 mg via INTRAVENOUS
  Filled 2022-03-12: qty 0.5

## 2022-03-12 MED ORDER — DIPHENHYDRAMINE HCL 25 MG PO CAPS
25.0000 mg | ORAL_CAPSULE | Freq: Once | ORAL | Status: AC
  Administered 2022-03-12: 25 mg via ORAL
  Filled 2022-03-12: qty 1

## 2022-03-12 MED ORDER — ACETAMINOPHEN 10 MG/ML IV SOLN
1000.0000 mg | Freq: Once | INTRAVENOUS | Status: AC
  Administered 2022-03-12: 1000 mg via INTRAVENOUS
  Filled 2022-03-12: qty 100

## 2022-03-12 MED ORDER — FENTANYL CITRATE PF 50 MCG/ML IJ SOSY
100.0000 ug | PREFILLED_SYRINGE | Freq: Once | INTRAMUSCULAR | Status: AC
  Administered 2022-03-12: 100 ug via INTRAVENOUS
  Filled 2022-03-12: qty 2

## 2022-03-12 MED ORDER — KETOROLAC TROMETHAMINE 15 MG/ML IJ SOLN
30.0000 mg | Freq: Once | INTRAMUSCULAR | Status: AC
  Administered 2022-03-12: 30 mg via INTRAVENOUS
  Filled 2022-03-12: qty 2

## 2022-03-12 MED ORDER — TAMSULOSIN HCL 0.4 MG PO CAPS
0.4000 mg | ORAL_CAPSULE | Freq: Every day | ORAL | Status: DC
  Administered 2022-03-12: 0.4 mg via ORAL
  Filled 2022-03-12: qty 1

## 2022-03-12 MED ORDER — SODIUM CHLORIDE 0.9 % IV SOLN
1.0000 g | Freq: Once | INTRAVENOUS | Status: AC
  Administered 2022-03-12: 1 g via INTRAVENOUS
  Filled 2022-03-12: qty 10

## 2022-03-12 NOTE — ED Provider Notes (Signed)
?[facility] ?Provider Note ? ? ?CSN: [id number] ?Arrival date & time: 03/12/22  0137 ? ?  ? ?History ? ?Chief Complaint  ?Patient presents with  ? Abdominal Pain  ? ? ?LOUCILE NIEBUR is a 46 y.o. female. ? ?The history is provided by the patient.  ?Abdominal Pain ?Pain location:  LLQ ?Pain quality: sharp   ?Pain radiates to:  L flank ?Pain severity:  Severe ?Onset quality:  Sudden ?Timing:  Constant ?Progression:  Worsening ?Chronicity:  New ?Relieved by:  Nothing ?Associated symptoms: nausea and vomiting   ?Associated symptoms: no dysuria and no fever   ?Patient reports sudden onset of left lower abdominal pain now having nausea and vomiting.  Also is having flank pain ?  ?Past Medical History:  ?Diagnosis Date  ? Acne   ? Amenorrhea   ? Anxiety   ? Bipolar disorder ([place])   ? type 2  ? Depression   ? Hypothyroidism   ? Infertility, female   ? treatment with oligammenorhea  Dr Carren Rang   ? Migraine   ? Restless legs syndrome   ? currently no problems per patient  ? SVD (spontaneous vaginal delivery)   ? x 1  ? Thyroiditis   ? ? ?Home Medications ?Prior to Admission medications   ?Medication Sig Start Date End Date Taking? Authorizing Provider  ?albuterol (VENTOLIN HFA) 108 (90 Base) MCG/ACT inhaler Inhale 1-2 puffs into the lungs every 6 (six) hours as needed for wheezing or shortness of breath. 01/26/22   Sharion Balloon, NP  ?amoxicillin-clavulanate (AUGMENTIN) 875-125 MG tablet Take 1 tablet by mouth every 12 (twelve) hours. 02/26/22   Hazel Sams, PA-C  ?benzonatate (TESSALON) 100 MG capsule Take 1 capsule (100 mg total) by mouth 3 (three) times daily as needed for cough. 03/06/22   Vanessa Kick, MD  ?fluconazole (DIFLUCAN) 150 MG tablet Take 1 tablet (150 mg total) by mouth daily. -For your yeast infection, start the Diflucan (fluconazole)- Take one pill today (day 1). If you're still having symptoms in 3 days, take the second pill. 02/26/22   Hazel Sams, PA-C  ?gabapentin (NEURONTIN) 300  MG capsule Take 1 capsule (300 mg total) by mouth 3 (three) times daily. 12/06/19   Lomax, Amy, NP  ?ibuprofen (ADVIL,MOTRIN) 800 MG tablet Take 1 tablet (800 mg total) by mouth every 8 (eight) hours as needed. 12/31/17   Tyson Dense, MD  ?levothyroxine (SYNTHROID) 75 MCG tablet levothyroxine 75 mcg tablet    [provider]  ?methocarbamol (ROBAXIN) 500 MG tablet Take 1 tablet (500 mg total) by mouth 2 (two) times daily as needed for muscle spasms. 12/10/21   Sharion Balloon, NP  ?nitrofurantoin, macrocrystal-monohydrate, (MACROBID) 100 MG capsule Take 1 capsule (100 mg total) by mouth 2 (two) times daily. 10/10/21   Rodriguez-Southworth, Sunday Spillers, PA-C  ?ondansetron (ZOFRAN) 4 MG tablet Take 1 tablet (4 mg total) by mouth every 6 (six) hours. 02/24/20   Wurst, [place], PA-C  ?phenazopyridine (PYRIDIUM) 200 MG tablet Take 1 tablet (200 mg total) by mouth 3 (three) times daily. 10/10/21   Rodriguez-Southworth, Sunday Spillers, PA-C  ?predniSONE (STERAPRED UNI-PAK 48 TAB) 10 MG (48) TBPK tablet Take as directed. 03/06/22   Vanessa Kick, MD  ?rOPINIRole (REQUIP) 3 MG tablet Take 3 mg by mouth at bedtime.    [provider]  ?ARIPiprazole (ABILIFY) 5 MG tablet Take 1 tablet (5 mg total) by mouth daily. 10/29/17 12/06/19  Clarene Reamer, MD  ?   ? ?  Allergies    ?Compazine [prochlorperazine], Hydrocodone, and Oxycodone   ? ?Review of Systems   ?Review of Systems  ?Constitutional:  Negative for fever.  ?Gastrointestinal:  Positive for abdominal pain, nausea and vomiting.  ?Genitourinary:  Negative for dysuria.  ? ?Physical Exam ?Updated Vital Signs ?BP 100/69   Pulse 88   Temp 97.6 ?F (36.4 ?C) (Oral)   Resp 18   Ht 1.676 m (5\' 6" )   Wt 77.1 kg   LMP  (LMP Unknown)   SpO2 97%   BMI 27.44 kg/m?  ?Physical Exam ?CONSTITUTIONAL: Well developed/well nourished, uncomfortable appearing ?HEAD: Normocephalic/atraumatic ?EYES: EOMI/PERRL ?ENMT: Mucous membranes moist ?NECK: supple no meningeal  signs ?SPINE/BACK:entire spine nontender ?CV: S1/S2 noted, no murmurs/rubs/gallops noted ?LUNGS: Lungs are clear to auscultation bilaterally, no apparent distress ?ABDOMEN: soft, left lower quadrant tenderness, no rebound or guarding, bowel sounds noted throughout abdomen ?GU: Left cva tenderness ?NEURO: Pt is awake/alert/appropriate, moves all extremitiesx4.  No facial droop.   ?EXTREMITIES: pulses normal/equal, full ROM ?SKIN: warm, color normal ?PSYCH: Anxious ? ?ED Results / Procedures / Treatments   ?Labs ?(all labs ordered are listed, but only abnormal results are displayed) ?Labs Reviewed  ?COMPREHENSIVE METABOLIC PANEL - Abnormal; Notable for the following components:  ?    Result Value  ? Glucose, Bld 109 (*)   ? All other components within normal limits  ?CBC - Abnormal; Notable for the following components:  ? WBC 10.7 (*)   ? All other components within normal limits  ?URINALYSIS, ROUTINE W REFLEX MICROSCOPIC - Abnormal; Notable for the following components:  ? APPearance HAZY (*)   ? Hgb urine dipstick LARGE (*)   ? Protein, ur 100 (*)   ? Nitrite POSITIVE (*)   ? Leukocytes,Ua LARGE (*)   ? RBC / HPF >50 (*)   ? WBC, UA >50 (*)   ? Bacteria, UA FEW (*)   ? All other components within normal limits  ?URINE CULTURE  ?RESP PANEL BY RT-PCR (FLU A&B, COVID) [id number]  ?LIPASE, BLOOD  ? ? ?EKG ?None ? ?Radiology ?CT Renal Stone Study ? ?Result Date: 03/12/2022 ?CLINICAL DATA:  Left lower quadrant pain that started tonight EXAM: CT ABDOMEN AND PELVIS WITHOUT CONTRAST TECHNIQUE: Multidetector CT imaging of the abdomen and pelvis was performed following the standard protocol without IV contrast. RADIATION DOSE REDUCTION: This exam was performed according to the departmental dose-optimization program which includes automated exposure control, adjustment of the mA and/or kV according to patient size and/or use of iterative reconstruction technique. COMPARISON:  None. FINDINGS: Lower chest:  No contributory findings.  Hepatobiliary: No focal liver abnormality.No evidence of biliary obstruction or stone. Pancreas: Unremarkable. Spleen: Unremarkable. Adrenals/Urinary Tract: Negative adrenals. 2 mm distal left ureteral stone, just above the UVJ. No hydronephrosis there is left perinephric stranding that is mild. Unremarkable bladder. Stomach/Bowel:  No obstruction. No appendicitis. Vascular/Lymphatic: No acute vascular abnormality. No mass or adenopathy. Reproductive:Hysterectomy.  Symmetric ovaries. Other: No ascites or pneumoperitoneum. Musculoskeletal: No acute abnormalities. IMPRESSION: 2 mm distal left ureteral calculus.  No hydronephrosis. Electronically Signed   By: Jorje Guild M.D.   On: 03/12/2022 04:26   ? ?Procedures ?Procedures  ? ? ?Medications Ordered in ED ?Medications  ?fentaNYL (SUBLIMAZE) injection 100 mcg (100 mcg Intravenous Given 03/12/22 0441)  ?ondansetron [facility]) injection 4 mg (4 mg Intravenous Given 03/12/22 0440)  ?cefTRIAXone (ROCEPHIN) 1 g in sodium chloride 0.9 % 100 mL IVPB (0 g Intravenous Stopped 03/12/22 0509)  ?sodium chloride 0.9 % bolus 1,000  mL (0 mLs Intravenous Stopped 03/12/22 0537)  ?ketorolac (TORADOL) 15 MG/ML injection 15 mg (15 mg Intravenous Given 03/12/22 0440)  ?ondansetron [facility]) injection 4 mg (4 mg Intravenous Given 03/12/22 0536)  ?fentaNYL (SUBLIMAZE) injection 100 mcg (100 mcg Intravenous Given 03/12/22 0550)  ? ? ?ED Course/ Medical Decision Making/ A&P ?Clinical Course as of 03/12/22 0609  ?Tue Mar 12, 2022  ?0544 Discussed the case with Dr. Lovena Neighbours with urology.  If we cannot control her pain, patient will need to be transferred to [facility] long for an emergent stent [DW]  ?0608 Patient still having abdominal and flank pain.  Her pain is intractable.  This patient has an infected stone, will transfer to [facility] long to be seen by urology [DW]  ?[phone] Accepted by Dr. Ralene Bathe in the emergency department at [facility] [DW]  ?  ?Clinical Course User Index ?[DW] Ripley Fraise, MD  ? ?                         ?Medical Decision Making ?Amount and/or Complexity of Data Reviewed ?Labs: ordered. ?Radiology: ordered. ? ?Risk ?Prescription drug management. ? ? ?This patient presents to the ED for concern of abdom

## 2022-03-12 NOTE — ED Provider Notes (Signed)
Blood pressure 111/76, pulse 73, temperature 97.6 ?F (36.4 ?C), temperature source Oral, resp. rate 16, height 5\' 6"  (1.676 m), weight 77.1 kg, SpO2 98 %. ? ?In short, Erica Larson is a 46 y.o. female with a chief complaint of Abdominal Pain ?59  Refer to the original H&P for additional details. ? ?09:30 AM  ?Patient arrives to the [facility] for Urology evaluation. Received Dilaudid PTA but feels like pain is coming back. Additional meds ordered. Patient received Rocephin for UTI noted at [facility]. Patient overall looks well.  ? ?Patient evaluated by Urology in the ED. Plan for pain control and close office follow up rather that stenting today.  ? ? ? ?  ?[facility], MD ?03/19/22 [phone] ? ?

## 2022-03-12 NOTE — ED Triage Notes (Signed)
LLQ pain that started tonight around 20:00. ?

## 2022-03-12 NOTE — Consult Note (Signed)
I have been asked to see the patient by Dr. Alona Bene, for evaluation and management of 2 mm left distal ureteral stone. ? ?History of present illness: ?46 year old female who presented to the [facility] emergency department late last night for acute onset left-sided flank pain with associated nausea and vomiting.  CT scan demonstrated 2 mm stone at the UVJ of the left ureter.  There are no additional stones.  The urine analysis did demonstrate concern for infection.  Otherwise, her labs were within normal limits including a normal creatinine and white blood cell count of 10.7.  The patient had minimal voiding symptoms leading up to her acute episode of flank pain.  She does have a history of recurrent urinary tract infections.  Her last documented UTI was in 2021.  The patient denies any fevers or chills. ? ?In the emergency department the patient was given several doses of narcotics as well as a small dose of ketorolac.  Her pain was still poorly controlled and so she was transferred to the [facility] for further evaluation.  Here in the ER she has been given lots of pain medication, continues to have poorly controlled pain.  She is complaining of bladder pressure and urgency.  She has had normal vital signs and has been afebrile. ? ?Review of systems: A 12 point comprehensive review of systems was obtained and is negative unless otherwise stated in the history of present illness. ? ?Patient Active Problem List  ? Diagnosis Date Noted  ? S/P laparoscopic assisted vaginal hysterectomy (LAVH) 12/29/2017  ? Restless leg syndrome 11/05/2015  ? Bipolar 2 disorder, major depressive episode (HCC) 11/04/2015  ? Anxiety state, unspecified 09/27/2013  ? Fatigue 01/02/2011  ? VITAMIN D DEFICIENCY 01/05/2009  ? Hypercalcemia 01/05/2009  ? NUMBNESS 01/05/2009  ? THYROIDITIS 01/02/2009  ? PALPITATIONS, RECURRENT 01/02/2009  ? SINUSITIS- ACUTE-NOS 08/15/2008  ? FEMALE INFERTILITY 07/18/2008  ? WEIGHT GAIN 07/18/2008   ? ACUTE TONSILLITIS 12/30/2007  ? URI 12/30/2007  ? MIGRAINE HEADACHE 08/12/2007  ? AMENORRHEA 08/12/2007  ? ? ?No current facility-administered medications on file prior to encounter.  ? ?Current Outpatient Medications on File Prior to Encounter  ?Medication Sig Dispense Refill  ? albuterol (VENTOLIN HFA) 108 (90 Base) MCG/ACT inhaler Inhale 1-2 puffs into the lungs every 6 (six) hours as needed for wheezing or shortness of breath. 18 g 0  ? amoxicillin-clavulanate (AUGMENTIN) 875-125 MG tablet Take 1 tablet by mouth every 12 (twelve) hours. 14 tablet 0  ? benzonatate (TESSALON) 100 MG capsule Take 1 capsule (100 mg total) by mouth 3 (three) times daily as needed for cough. 21 capsule 0  ? fluconazole (DIFLUCAN) 150 MG tablet Take 1 tablet (150 mg total) by mouth daily. -For your yeast infection, start the Diflucan (fluconazole)- Take one pill today (day 1). If you're still having symptoms in 3 days, take the second pill. 2 tablet 0  ? gabapentin (NEURONTIN) 300 MG capsule Take 1 capsule (300 mg total) by mouth 3 (three) times daily. 270 capsule 3  ? ibuprofen (ADVIL,MOTRIN) 800 MG tablet Take 1 tablet (800 mg total) by mouth every 8 (eight) hours as needed. 30 tablet 0  ? levothyroxine (SYNTHROID) 75 MCG tablet levothyroxine 75 mcg tablet    ? methocarbamol (ROBAXIN) 500 MG tablet Take 1 tablet (500 mg total) by mouth 2 (two) times daily as needed for muscle spasms. 10 tablet 0  ? nitrofurantoin, macrocrystal-monohydrate, (MACROBID) 100 MG capsule Take 1 capsule (100 mg total) by mouth  2 (two) times daily. 14 capsule 0  ? ondansetron (ZOFRAN) 4 MG tablet Take 1 tablet (4 mg total) by mouth every 6 (six) hours. 12 tablet 0  ? phenazopyridine (PYRIDIUM) 200 MG tablet Take 1 tablet (200 mg total) by mouth 3 (three) times daily. 6 tablet 0  ? predniSONE (STERAPRED UNI-PAK 48 TAB) 10 MG (48) TBPK tablet Take as directed. 48 tablet 0  ? rOPINIRole (REQUIP) 3 MG tablet Take 3 mg by mouth at bedtime.    ? [DISCONTINUED]  ARIPiprazole (ABILIFY) 5 MG tablet Take 1 tablet (5 mg total) by mouth daily. 30 tablet 2  ? ? ?Past Medical History:  ?Diagnosis Date  ? Acne   ? Amenorrhea   ? Anxiety   ? Bipolar disorder (HCC)   ? type 2  ? Depression   ? Hypothyroidism   ? Infertility, female   ? treatment with oligammenorhea  Dr Chevis Pretty   ? Migraine   ? Restless legs syndrome   ? currently no problems per patient  ? SVD (spontaneous vaginal delivery)   ? x 1  ? Thyroiditis   ? ? ?Past Surgical History:  ?Procedure Laterality Date  ? CYSTOSCOPY  12/29/2017  ? Procedure: CYSTOSCOPY;  Surgeon: Ranae Pila, MD;  Location: [facility];  Service: Gynecology;;  ? DILATION AND CURETTAGE OF UTERUS    ? LAPAROSCOPIC VAGINAL HYSTERECTOMY WITH SALPINGECTOMY Bilateral 12/29/2017  ? Procedure: LAPAROSCOPIC ASSISTED VAGINAL HYSTERECTOMY WITH SALPINGECTOMY, mccalls culdoplasty;  Surgeon: Ranae Pila, MD;  Location: [facility];  Service: Gynecology;  Laterality: Bilateral;  ? WISDOM TOOTH EXTRACTION    ? ? ?Social History  ? ?Tobacco Use  ? Smoking status: Never  ? Smokeless tobacco: Never  ?Vaping Use  ? Vaping Use: Never used  ?Substance Use Topics  ? Alcohol use: Yes  ?  Comment: Rarely  ? Drug use: No  ? ? ?Family History  ?Problem Relation Age of Onset  ? Thyroid disease Mother   ? Nephrolithiasis Mother   ? Urolithiasis Mother   ? Heart attack Mother   ?     signs but neg eval  ? Nephrolithiasis Father   ? Urolithiasis Father   ? Anxiety disorder Father   ? Drug abuse Father   ? Thyroid disease Sister   ? Heart disease Maternal Grandmother   ? ? ?PE: ?Vitals:  ? 03/12/22 0945 03/12/22 1000 03/12/22 1100 03/12/22 1200  ?BP: 111/73 (!) 109/58 106/81 118/88  ?Pulse: 72 72 70   ?Resp: 15 12 13  (!) 21  ?Temp: 97.7 ?F (36.5 ?C)     ?TempSrc: Oral     ?SpO2: 94% 99% 90%   ?Weight:      ?Height:      ? ?Patient appears sedated but arousable and appropriate ?patient is alert and oriented x3 ?Atraumatic normocephalic head ?No cervical or supraclavicular  lymphadenopathy appreciated ?No increased work of breathing, no audible wheezes/rhonchi ?Regular sinus rhythm/rate ?Abdomen is soft, nontender, nondistended, left CVA tenderness ?Lower extremities are symmetric without appreciable edema ?Grossly neurologically intact ?No identifiable skin lesions ? ?Recent Labs  ?  03/12/22 ?0217  ?WBC 10.7*  ?HGB 12.7  ?HCT 38.2  ? ?Recent Labs  ?  03/12/22 ?0217  ?NA 139  ?K 3.8  ?CL 107  ?CO2 25  ?GLUCOSE 109*  ?BUN 13  ?CREATININE 0.65  ?CALCIUM 10.3  ? ?No results for input(s): LABPT, INR in the last 72 hours. ?No results for input(s): LABURIN in the last 72 hours. ?Results  for orders placed or performed during the hospital encounter of 03/12/22  ?Resp Panel by RT-PCR (Flu A&B, Covid) Nasopharyngeal Swab     Status: None  ? Collection Time: 03/12/22  6:10 AM  ? Specimen: Nasopharyngeal Swab; Nasopharyngeal(NP) swabs in vial transport medium  ?Result Value Ref Range Status  ? SARS Coronavirus 2 by RT PCR NEGATIVE NEGATIVE Final  ?  Comment: (NOTE) ?SARS-CoV-2 target nucleic acids are NOT DETECTED. ? ?The SARS-CoV-2 RNA is generally detectable in upper respiratory ?specimens during the acute phase of infection. The lowest ?concentration of SARS-CoV-2 viral copies this assay can detect is ?138 copies/mL. A negative result does not preclude SARS-Cov-2 ?infection and should not be used as the sole basis for treatment or ?other patient management decisions. A negative result may occur with  ?improper specimen collection/handling, submission of specimen other ?than nasopharyngeal swab, presence of viral mutation(s) within the ?areas targeted by this assay, and inadequate number of viral ?copies(<138 copies/mL). A negative result must be combined with ?clinical observations, patient history, and epidemiological ?information. The expected result is Negative. ? ?Fact Sheet for Patients:  ?BloggerCourse.com ? ?Fact Sheet for Healthcare Providers:   ?SeriousBroker.it ? ?This test is no t yet approved or cleared by the [place] FDA and  ?has been authorized for detection and/or diagnosis of SARS-CoV-2 by ?FDA under an Emergency Use Autho

## 2022-03-12 NOTE — ED Notes (Signed)
Pt is reporting pain and itching and nausea (she ate fast food) EDP notified who ordered Tramadol. Pt is requesting to stay another hour as she is worried about pain control and does not feel comfortable leaving.  ?

## 2022-03-12 NOTE — ED Notes (Signed)
Placed on [facility] post-Fentanyl ?

## 2022-03-12 NOTE — ED Notes (Signed)
Pt is having 6/10 pain and reports that she feels she is having too much pain to make it at home.  Advised that prescriptions medications will help and that flomax should help pass the stone and once it is passed she will feel relief. ?

## 2022-03-12 NOTE — ED Notes (Signed)
Pt is being seen by Urology and she has a visitor at the bedside.   ?

## 2022-03-12 NOTE — ED Notes (Signed)
Report called to [place], RN at [facility] ?

## 2022-03-12 NOTE — ED Notes (Signed)
[facility] at bedside.  All belongings, including car keys, placed in a bag and provided to transport.   ?

## 2022-03-12 NOTE — ED Notes (Signed)
Pt is sleeping, she is waiting for her daughter who is coming to get her at 1500 ?

## 2022-03-12 NOTE — Discharge Instructions (Signed)
You have been seen in the Emergency Department (ED) today for pain that we believe based on your workup, is caused by kidney stones.  As we have discussed, please drink plenty of fluids.  Please make a follow up appointment with the physician(s) listed elsewhere in this documentation. ? ?You may take pain medication as needed but ONLY as prescribed.  Please also take your prescribed Flomax daily.  We also recommend that you take over-the-counter ibuprofen regularly according to label instructions over the next 5 days.  Take it with meals to minimize stomach discomfort. ? ?Please see your doctor as soon as possible as stones may take 1-3 weeks to pass and you may require additional care or medications. ? ?Do not drink alcohol, drive or participate in any other potentially dangerous activities while taking opiate pain medication as it may make you sleepy. Do not take this medication with any other sedating medications, either prescription or over-the-counter. If you were prescribed Percocet or Vicodin, do not take these with acetaminophen (Tylenol) as it is already contained within these medications. ?  ?Take Tramadol as needed for severe pain.  This medication is an opiate (or narcotic) pain medication and can be habit forming.  Use it as little as possible to achieve adequate pain control.  Do not use or use it with extreme caution if you have a history of opiate abuse or dependence.  If you are on a pain contract with your primary care doctor or a pain specialist, be sure to let them know you were prescribed this medication today from the Emergency Department.  This medication is intended for your use only - do not give any to anyone else and keep it in a secure place where nobody else, especially children, have access to it.  It will also cause or worsen constipation, so you may want to consider taking an over-the-counter stool softener while you are taking this medication. ? ? ? ?

## 2022-03-12 NOTE — ED Notes (Signed)
Patient transported to CT 

## 2022-03-14 ENCOUNTER — Other Ambulatory Visit: Payer: Self-pay

## 2022-03-14 ENCOUNTER — Encounter: Payer: Self-pay | Admitting: Emergency Medicine

## 2022-03-14 ENCOUNTER — Emergency Department
Admission: EM | Admit: 2022-03-14 | Discharge: 2022-03-14 | Disposition: A | Discharge status: Home / Self Care | Payer: No Typology Code available for payment source | Attending: Emergency Medicine | Admitting: Emergency Medicine

## 2022-03-14 DIAGNOSIS — G43909 Migraine, unspecified, not intractable, without status migrainosus: Secondary | ICD-10-CM | POA: Insufficient documentation

## 2022-03-14 DIAGNOSIS — N201 Calculus of ureter: Secondary | ICD-10-CM | POA: Diagnosis not present

## 2022-03-14 DIAGNOSIS — Z5321 Procedure and treatment not carried out due to patient leaving prior to being seen by health care provider: Secondary | ICD-10-CM | POA: Diagnosis not present

## 2022-03-14 DIAGNOSIS — R109 Unspecified abdominal pain: Secondary | ICD-10-CM | POA: Insufficient documentation

## 2022-03-14 DIAGNOSIS — N3001 Acute cystitis with hematuria: Secondary | ICD-10-CM | POA: Diagnosis not present

## 2022-03-14 LAB — URINALYSIS, ROUTINE W REFLEX MICROSCOPIC
Bilirubin Urine: NEGATIVE
Glucose, UA: NEGATIVE mg/dL
Hgb urine dipstick: NEGATIVE
Ketones, ur: NEGATIVE mg/dL
Leukocytes,Ua: NEGATIVE
Nitrite: NEGATIVE
Protein, ur: NEGATIVE mg/dL
Specific Gravity, Urine: 1.02 (ref 1.005–1.030)
pH: 5 (ref 5.0–8.0)

## 2022-03-14 LAB — URINE CULTURE: Culture: >=100000 — AB

## 2022-03-14 NOTE — ED Notes (Signed)
Pt not in room at this time. Unclear if patient eloped. ?

## 2022-03-14 NOTE — ED Triage Notes (Signed)
Pt comes into the ED via POV c/o left flank pain.  Pt states she was dx on Tuesday with a kidney stone and she is still having pain.  Pt also admits to a migraine.  Pt in NAD at this time with even and unlabored respirations.   ?

## 2022-03-14 NOTE — ED Notes (Signed)
Pt reports that she thinks she passed her kidney stone. Pt denies flank pain currently. Will notify MD.  ?

## 2022-03-14 NOTE — ED Notes (Signed)
Per MD, OK to hold off on bloodwork.  ?

## 2022-03-14 NOTE — ED Provider Notes (Signed)
----------------------------------------- ?  10:11 AM on 03/14/2022 ?----------------------------------------- ?Patient appears to have eloped from the emergency department prior to my evaluation.  She had previously stated to nursing that she believes she had passed her kidney stone, no signs of infection on urinalysis and she was previously prescribed antibiotics. ?  ?Erica Noon, MD ?03/14/22 1012 ? ?

## 2022-03-15 ENCOUNTER — Telehealth: Payer: Self-pay | Admitting: Emergency Medicine

## 2022-03-15 NOTE — Telephone Encounter (Signed)
Post ED Visit - Positive Culture Follow-up ? ?Culture report reviewed by antimicrobial stewardship pharmacist: ?[place] Team ?[]  Elenor Quinones, Pharm.D. ?[x]  Heide Guile, Pharm.D., BCPS AQ-ID ?[]  Parks Neptune, Pharm.D., BCPS ?[]  Alycia Rossetti, Pharm.D., BCPS ?[]  Henry, Pharm.D., BCPS, AAHIVP ?[]  Legrand Como, Pharm.D., BCPS, AAHIVP ?[]  Salome Arnt, PharmD, BCPS ?[]  Johnnette Gourd, PharmD, BCPS ?[]  Hughes Better, PharmD, BCPS ?[]  Leeroy Cha, PharmD ?[]  Laqueta Linden, PharmD, BCPS ?[]  Albertina Parr, PharmD ? ?[place] Team ?[]  Leodis Sias, PharmD ?[]  Lindell Spar, PharmD ?[]  Royetta Asal, PharmD ?[]  Graylin Shiver, Rph ?[]  Rema Fendt) Glennon Mac, PharmD ?[]  Arlyn Dunning, PharmD ?[]  Netta Cedars, PharmD ?[]  Dia Sitter, PharmD ?[]  Leone Haven, PharmD ?[]  Gretta Arab, PharmD ?[]  Theodis Shove, PharmD ?[]  Peggyann Juba, PharmD ?[]  Reuel Boom, PharmD ? ? ?Positive urine culture ?Treated with Cefpodoxime, organism sensitive to the same and no further patient follow-up is required at this time. ? ?Milus Mallick ?03/15/2022, 10:05 AM ?  ?

## 2022-03-26 ENCOUNTER — Emergency Department (HOSPITAL_COMMUNITY): Payer: No Typology Code available for payment source

## 2022-03-26 ENCOUNTER — Encounter (HOSPITAL_COMMUNITY): Payer: Self-pay | Admitting: Emergency Medicine

## 2022-03-26 ENCOUNTER — Emergency Department (HOSPITAL_COMMUNITY)
Admission: EM | Admit: 2022-03-26 | Discharge: 2022-03-26 | Disposition: A | Discharge status: Home / Self Care | Payer: No Typology Code available for payment source | Attending: Emergency Medicine | Admitting: Emergency Medicine

## 2022-03-26 ENCOUNTER — Other Ambulatory Visit: Payer: Self-pay

## 2022-03-26 DIAGNOSIS — S4992XA Unspecified injury of left shoulder and upper arm, initial encounter: Secondary | ICD-10-CM | POA: Diagnosis present

## 2022-03-26 DIAGNOSIS — T1490XA Injury, unspecified, initial encounter: Secondary | ICD-10-CM

## 2022-03-26 DIAGNOSIS — S3992XA Unspecified injury of lower back, initial encounter: Secondary | ICD-10-CM | POA: Diagnosis not present

## 2022-03-26 DIAGNOSIS — S46912A Strain of unspecified muscle, fascia and tendon at shoulder and upper arm level, left arm, initial encounter: Secondary | ICD-10-CM | POA: Insufficient documentation

## 2022-03-26 DIAGNOSIS — R0600 Dyspnea, unspecified: Secondary | ICD-10-CM | POA: Insufficient documentation

## 2022-03-26 MED ORDER — ONDANSETRON 8 MG PO TBDP
8.0000 mg | ORAL_TABLET | Freq: Once | ORAL | Status: AC
  Administered 2022-03-26: 8 mg via ORAL
  Filled 2022-03-26: qty 1

## 2022-03-26 MED ORDER — KETOROLAC TROMETHAMINE 60 MG/2ML IM SOLN
60.0000 mg | Freq: Once | INTRAMUSCULAR | Status: AC
Start: 2022-03-26 — End: 2022-03-26
  Administered 2022-03-26: 60 mg via INTRAMUSCULAR
  Filled 2022-03-26: qty 2

## 2022-03-26 MED ORDER — METHOCARBAMOL 500 MG PO TABS: 500.0000 mg | ORAL_TABLET | Freq: Two times a day (BID) | ORAL | 0 refills | Status: DC
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
## 2022-04-26 ENCOUNTER — Ambulatory Visit
Admission: RE | Admit: 2022-04-26 | Discharge: 2022-04-26 | Disposition: A | Discharge status: Home / Self Care | Payer: Self-pay | Source: Ambulatory Visit | Attending: Family Medicine | Admitting: Family Medicine

## 2022-04-26 ENCOUNTER — Ambulatory Visit (INDEPENDENT_AMBULATORY_CARE_PROVIDER_SITE_OTHER): Payer: Self-pay

## 2022-04-26 VITALS — BP 119/84 | HR 101 | Temp 98.7°F | Resp 16

## 2022-04-26 DIAGNOSIS — M25551 Pain in right hip: Secondary | ICD-10-CM

## 2022-04-26 DIAGNOSIS — M545 Low back pain, unspecified: Secondary | ICD-10-CM

## 2022-04-26 DIAGNOSIS — M7918 Myalgia, other site: Secondary | ICD-10-CM

## 2022-04-26 DIAGNOSIS — W19XXXA Unspecified fall, initial encounter: Secondary | ICD-10-CM

## 2022-04-26 MED ORDER — METHOCARBAMOL 500 MG PO TABS: 500.0000 mg | ORAL_TABLET | Freq: Two times a day (BID) | ORAL | 0 refills | Status: DC

## 2022-04-26 MED ORDER — IBUPROFEN 800 MG PO TABS: 800.0000 mg | ORAL_TABLET | Freq: Three times a day (TID) | ORAL | 0 refills | Status: DC

## 2022-04-26 NOTE — ED Triage Notes (Signed)
Patient presents to Urgent Care with complaints of a fall landing on her back yesterday at [employer] . She c/o of right arm pain, right elbow, lower back pain that radiates to her right leg, and left shoulder pain. Treating pain with 800 mg of ibuprofen. Last dose 2 hrs ago.

## 2022-04-26 NOTE — ED Provider Notes (Signed)
Renaldo Fiddler    CSN: [id number] Arrival date & time: 04/26/22  1708      History   Chief Complaint Chief Complaint  Patient presents with   Back Pain    Entered by patient    HPI Erica Larson is a 46 y.o. female.   HPI Patient presents today with a back injury she sustained yesterday after falling at a grocery store. She reports multiple areas of pain but is mostly concerned about back pain and right hip pain. She is   Past Medical History:  Diagnosis Date   Acne    Amenorrhea    Anxiety    Bipolar disorder (HCC)    type 2   Depression    Hypothyroidism    Infertility, female    treatment with oligammenorhea  Dr Chevis Pretty    Migraine    Restless legs syndrome    currently no problems per patient   SVD (spontaneous vaginal delivery)    x 1   Thyroiditis     Patient Active Problem List   Diagnosis Date Noted   S/P laparoscopic assisted vaginal hysterectomy (LAVH) 12/29/2017   Restless leg syndrome 11/05/2015   Bipolar 2 disorder, major depressive episode (HCC) 11/04/2015   Anxiety state, unspecified 09/27/2013   Fatigue 01/02/2011   VITAMIN D DEFICIENCY 01/05/2009   Hypercalcemia 01/05/2009   NUMBNESS 01/05/2009   THYROIDITIS 01/02/2009   PALPITATIONS, RECURRENT 01/02/2009   SINUSITIS- ACUTE-NOS 08/15/2008   FEMALE INFERTILITY 07/18/2008   WEIGHT GAIN 07/18/2008   ACUTE TONSILLITIS 12/30/2007   URI 12/30/2007   MIGRAINE HEADACHE 08/12/2007   AMENORRHEA 08/12/2007    Past Surgical History:  Procedure Laterality Date   CYSTOSCOPY  12/29/2017   Procedure: CYSTOSCOPY;  Surgeon: Ranae Pila, MD;  Location: [facility];  Service: Gynecology;;   DILATION AND CURETTAGE OF UTERUS     LAPAROSCOPIC VAGINAL HYSTERECTOMY WITH SALPINGECTOMY Bilateral 12/29/2017   Procedure: LAPAROSCOPIC ASSISTED VAGINAL HYSTERECTOMY WITH SALPINGECTOMY, mccalls culdoplasty;  Surgeon: Ranae Pila, MD;  Location: [facility];  Service: Gynecology;  Laterality:  Bilateral;   WISDOM TOOTH EXTRACTION      OB History     Gravida  3   Para  1   Term      Preterm  1   AB  2   Living  1      SAB      IAB      Ectopic  2   Multiple      Live Births               Home Medications    Prior to Admission medications   Medication Sig Start Date End Date Taking? Authorizing Provider  albuterol (VENTOLIN HFA) 108 (90 Base) MCG/ACT inhaler Inhale 1-2 puffs into the lungs every 6 (six) hours as needed for wheezing or shortness of breath. 01/26/22   Mickie Bail, NP  benzonatate (TESSALON) 100 MG capsule Take 1 capsule (100 mg total) by mouth 3 (three) times daily as needed for cough. 03/06/22   Mardella Layman, MD  fluconazole (DIFLUCAN) 150 MG tablet Take 1 tablet (150 mg total) by mouth daily. -For your yeast infection, start the Diflucan (fluconazole)- Take one pill today (day 1). If you're still having symptoms in 3 days, take the second pill. 02/26/22   Rhys Martini, PA-C  gabapentin (NEURONTIN) 300 MG capsule Take 1 capsule (300 mg total) by mouth 3 (three) times daily. Patient taking differently: Take 300 mg by  mouth 3 (three) times daily as needed (nerve pain). 12/06/19   Lomax, Amy, NP  levothyroxine (SYNTHROID) 75 MCG tablet Take 75 mcg by mouth daily before breakfast.    [provider]  methocarbamol (ROBAXIN) 500 MG tablet Take 1 tablet (500 mg total) by mouth 2 (two) times daily. 03/26/22   Zadie Rhine, MD  ondansetron (ZOFRAN) 4 MG tablet Take 1 tablet (4 mg total) by mouth every 6 (six) hours. Patient not taking: Reported on 03/12/2022 02/24/20   Wurst, [place], PA-C  ondansetron (ZOFRAN-ODT) 4 MG disintegrating tablet Take 1 tablet (4 mg total) by mouth every 8 (eight) hours as needed for nausea or vomiting. 03/12/22   Long, Arlyss Repress, MD  rOPINIRole (REQUIP) 3 MG tablet Take 3 mg by mouth at bedtime.    [provider]  traMADol (ULTRAM) 50 MG tablet Take 2 tablets (100 mg total) by mouth every 6 (six)  hours as needed for severe pain. 03/12/22   Long, Arlyss Repress, MD  ARIPiprazole (ABILIFY) 5 MG tablet Take 1 tablet (5 mg total) by mouth daily. 10/29/17 12/06/19  Benjaman Pott, MD    Family History Family History  Problem Relation Age of Onset   Thyroid disease Mother    Nephrolithiasis Mother    Urolithiasis Mother    Heart attack Mother        signs but neg eval   Nephrolithiasis Father    Urolithiasis Father    Anxiety disorder Father    Drug abuse Father    Thyroid disease Sister    Heart disease Maternal Grandmother     Social History Social History   Tobacco Use   Smoking status: Never   Smokeless tobacco: Never  Vaping Use   Vaping Use: Never used  Substance Use Topics   Alcohol use: Yes    Comment: Rarely   Drug use: No     Allergies   Compazine [prochlorperazine], Hydrocodone, and Oxycodone   Review of Systems Review of Systems Pertinent negatives listed in HPI   Physical Exam Triage Vital Signs ED Triage Vitals [04/26/22 1721]  Enc Vitals Group     BP 119/84     Pulse Rate (!) 101     Resp 16     Temp 98.7 F (37.1 C)     Temp Source Oral     SpO2 95 %     Weight      Height      Head Circumference      Peak Flow      Pain Score      Pain Loc      Pain Edu?      Excl. in GC?    No data found.  Updated Vital Signs BP 119/84 (BP Location: Left Arm)   Pulse (!) 101   Temp 98.7 F (37.1 C) (Oral)   Resp 16   LMP  (LMP Unknown)   SpO2 95%   Visual Acuity Right Eye Distance:   Left Eye Distance:   Bilateral Distance:    Right Eye Near:   Left Eye Near:    Bilateral Near:     Physical Exam   UC Treatments / Results  Labs (all labs ordered are listed, but only abnormal results are displayed) Labs Reviewed - No data to display  EKG   Radiology No results found.  Procedures Procedures (including critical care time)  Medications Ordered in UC Medications - No data to display  Initial Impression / Assessment and Plan  /  UC Course  I have reviewed the triage vital signs and the nursing notes.  Pertinent labs & imaging results that were available during my care of the patient were reviewed by me and considered in my medical decision making (see chart for details).     Final Clinical Impressions(s) / UC Diagnoses   Final diagnoses:  Fall, initial encounter  Musculoskeletal pain  Lumbar back pain   Discharge Instructions   None    ED Prescriptions   None    PDMP not reviewed this encounter.

## 2022-04-26 NOTE — Discharge Instructions (Addendum)
Your images are negative for any acute fractures related to injury. If symptoms persist follow-up with [facility].

## 2022-07-18 ENCOUNTER — Emergency Department (HOSPITAL_COMMUNITY)
Admission: EM | Admit: 2022-07-18 | Discharge: 2022-07-19 | Disposition: A | Discharge status: Home / Self Care | Payer: BC Managed Care – PPO | Attending: Emergency Medicine | Admitting: Emergency Medicine

## 2022-07-18 ENCOUNTER — Encounter (HOSPITAL_COMMUNITY): Payer: Self-pay | Admitting: Emergency Medicine

## 2022-07-18 ENCOUNTER — Other Ambulatory Visit: Payer: Self-pay

## 2022-07-18 DIAGNOSIS — E876 Hypokalemia: Secondary | ICD-10-CM | POA: Insufficient documentation

## 2022-07-18 DIAGNOSIS — S91352A Open bite, left foot, initial encounter: Secondary | ICD-10-CM | POA: Insufficient documentation

## 2022-07-18 DIAGNOSIS — W5911XA Bitten by nonvenomous snake, initial encounter: Secondary | ICD-10-CM | POA: Insufficient documentation

## 2022-07-18 DIAGNOSIS — S99922A Unspecified injury of left foot, initial encounter: Secondary | ICD-10-CM | POA: Diagnosis present

## 2022-07-18 DIAGNOSIS — R202 Paresthesia of skin: Secondary | ICD-10-CM | POA: Diagnosis not present

## 2022-07-18 LAB — CBC WITH DIFFERENTIAL/PLATELET
Abs Immature Granulocytes: 0.02 10*3/uL (ref 0.00–0.07)
Basophils Absolute: 0 10*3/uL (ref 0.0–0.1)
Basophils Relative: 0 %
Eosinophils Absolute: 0.1 10*3/uL (ref 0.0–0.5)
Eosinophils Relative: 2 %
HCT: 38.3 % (ref 36.0–46.0)
Hemoglobin: 12.9 g/dL (ref 12.0–15.0)
Immature Granulocytes: 0 %
Lymphocytes Relative: 30 %
Lymphs Abs: 2.2 10*3/uL (ref 0.7–4.0)
MCH: 29.9 pg (ref 26.0–34.0)
MCHC: 33.7 g/dL (ref 30.0–36.0)
MCV: 88.7 fL (ref 80.0–100.0)
Monocytes Absolute: 0.6 10*3/uL (ref 0.1–1.0)
Monocytes Relative: 7 %
Neutro Abs: 4.5 10*3/uL (ref 1.7–7.7)
Neutrophils Relative %: 61 %
Platelets: 346 10*3/uL (ref 150–400)
RBC: 4.32 MIL/uL (ref 3.87–5.11)
RDW: 13 % (ref 11.5–15.5)
WBC: 7.4 10*3/uL (ref 4.0–10.5)
nRBC: 0 % (ref 0.0–0.2)

## 2022-07-18 LAB — BASIC METABOLIC PANEL
Anion gap: 7 (ref 5–15)
BUN: 13 mg/dL (ref 6–20)
CO2: 25 mmol/L (ref 22–32)
Calcium: 10.3 mg/dL (ref 8.9–10.3)
Chloride: 106 mmol/L (ref 98–111)
Creatinine, Ser: 0.65 mg/dL (ref 0.44–1.00)
GFR, Estimated: >60 mL/min (ref >60)
Glucose, Bld: 85 mg/dL (ref 70–99)
Potassium: 3.4 mmol/L — ABNORMAL LOW (ref 3.5–5.1)
Sodium: 138 mmol/L (ref 135–145)

## 2022-07-18 LAB — FIBRINOGEN: Fibrinogen: 298 mg/dL (ref 210–475)

## 2022-07-18 LAB — PROTIME-INR
INR: 1 (ref 0.8–1.2)
Prothrombin Time: 13.1 seconds (ref 11.4–15.2)

## 2022-07-18 MED ORDER — LACTATED RINGERS IV BOLUS
1000.0000 mL | Freq: Once | INTRAVENOUS | Status: AC
  Administered 2022-07-18: 1000 mL via INTRAVENOUS

## 2022-07-18 MED ORDER — HYDROMORPHONE HCL 1 MG/ML IJ SOLN
1.0000 mg | Freq: Once | INTRAMUSCULAR | Status: AC
  Administered 2022-07-18: 1 mg via INTRAVENOUS
  Filled 2022-07-18: qty 1

## 2022-07-18 NOTE — ED Triage Notes (Addendum)
Pt to ed via [facility] c/o confirmed copperhead snake bite to left anterior ankle around 2110.  18" copperhead snake that was killed onsite and verified by EMS. Pt c/o pain to site. EMS gave a of fentanyl. A total of 100 mcg fentanyl. Last dose 2210. Poison control called onsite  Hx of borderline thyroid, partial hysterectomy, restless leg syndrome

## 2022-07-18 NOTE — ED Provider Notes (Signed)
[facility] EMERGENCY DEPARTMENT Provider Note   CSN: [id number] Arrival date & time: 07/18/22  2219     History  Chief Complaint  Patient presents with   Snake Bite    Copper Head    Erica Larson is a 46 y.o. female.  HPI 46 year old female presents after a snakebite to the left foot/ankle.  This occurred around 9:15 PM.  Baby copperhead was killed and confirmed as a copperhead.  Patient is having severe pain and some tingling in her foot.  She was given a total of 100 mcg of IV fentanyl which minimally helped.  Home Medications Prior to Admission medications   Medication Sig Start Date End Date Taking? Authorizing Provider  albuterol (VENTOLIN HFA) 108 (90 Base) MCG/ACT inhaler Inhale 1-2 puffs into the lungs every 6 (six) hours as needed for wheezing or shortness of breath. 01/26/22   Mickie Bail, NP  benzonatate (TESSALON) 100 MG capsule Take 1 capsule (100 mg total) by mouth 3 (three) times daily as needed for cough. 03/06/22   Mardella Layman, MD  fluconazole (DIFLUCAN) 150 MG tablet Take 1 tablet (150 mg total) by mouth daily. -For your yeast infection, start the Diflucan (fluconazole)- Take one pill today (day 1). If you're still having symptoms in 3 days, take the second pill. 02/26/22   Rhys Martini, PA-C  gabapentin (NEURONTIN) 300 MG capsule Take 1 capsule (300 mg total) by mouth 3 (three) times daily. Patient taking differently: Take 300 mg by mouth 3 (three) times daily as needed (nerve pain). 12/06/19   Lomax, Amy, NP  ibuprofen (ADVIL) 800 MG tablet Take 1 tablet (800 mg total) by mouth 3 (three) times daily. 04/26/22   Bing Neighbors, FNP  levothyroxine (SYNTHROID) 75 MCG tablet Take 75 mcg by mouth daily before breakfast.    [provider]  methocarbamol (ROBAXIN) 500 MG tablet Take 1 tablet (500 mg total) by mouth 2 (two) times daily. 04/26/22   Bing Neighbors, FNP  ondansetron (ZOFRAN) 4 MG tablet Take 1 tablet (4 mg total) by mouth every 6 (six)  hours. Patient not taking: Reported on 03/12/2022 02/24/20   Wurst, [place], PA-C  ondansetron (ZOFRAN-ODT) 4 MG disintegrating tablet Take 1 tablet (4 mg total) by mouth every 8 (eight) hours as needed for nausea or vomiting. 03/12/22   Long, Arlyss Repress, MD  rOPINIRole (REQUIP) 3 MG tablet Take 3 mg by mouth at bedtime.    [provider]  traMADol (ULTRAM) 50 MG tablet Take 2 tablets (100 mg total) by mouth every 6 (six) hours as needed for severe pain. 03/12/22   Long, Arlyss Repress, MD  ARIPiprazole (ABILIFY) 5 MG tablet Take 1 tablet (5 mg total) by mouth daily. 10/29/17 12/06/19  Benjaman Pott, MD      Allergies    Compazine [prochlorperazine], Hydrocodone, and Oxycodone    Review of Systems   Review of Systems  Musculoskeletal:  Positive for arthralgias and joint swelling.  Neurological:  Positive for numbness. Negative for weakness.    Physical Exam Updated Vital Signs BP (!) 125/93   Pulse 85   Temp 97.8 F (36.6 C) (Oral)   Resp 16   Ht 5\' 6"  (1.676 m)   Wt 79.4 kg   LMP  (LMP Unknown)   SpO2 100%   BMI 28.25 kg/m  Physical Exam Vitals and nursing note reviewed.  Constitutional:      Appearance: She is well-developed.  HENT:     Head: Normocephalic  and atraumatic.  Cardiovascular:     Rate and Rhythm: Normal rate and regular rhythm.     Pulses:          Dorsalis pedis pulses are 2+ on the left side.     Heart sounds: Normal heart sounds.  Pulmonary:     Effort: Pulmonary effort is normal.  Abdominal:     General: There is no distension.  Musculoskeletal:       Legs:     Comments: Able to wiggle toes and grossly normal sensation bilaterally in the foot.  Skin:    General: Skin is warm and dry.  Neurological:     Mental Status: She is alert.     ED Results / Procedures / Treatments   Labs (all labs ordered are listed, but only abnormal results are displayed) Labs Reviewed  BASIC METABOLIC PANEL - Abnormal; Notable for the following components:       Result Value   Potassium 3.4 (*)    All other components within normal limits  CBC WITH DIFFERENTIAL/PLATELET  PROTIME-INR  FIBRINOGEN  CBC WITH DIFFERENTIAL/PLATELET  CBC WITH DIFFERENTIAL/PLATELET  PROTIME-INR  PROTIME-INR  FIBRINOGEN  FIBRINOGEN  POC URINE PREG, ED    EKG EKG Interpretation  Date/Time:  Thursday July 18 2022 22:45:50 EDT Ventricular Rate:  94 PR Interval:    QRS Duration: 79 QT Interval:  347 QTC Calculation: 430 R Axis:   49 Text Interpretation: sinus rhythm with artifact no acute ST/T changes Poor data quality in current ECG precludes serial comparison Confirmed by Pricilla Loveless [phone]) on 07/18/2022 11:31:47 PM  Radiology No results found.  Procedures Procedures    Medications Ordered in ED Medications  lactated ringers bolus 1,000 mL (0 mLs Intravenous Stopped 07/18/22 2355)  HYDROmorphone (DILAUDID) injection 1 mg (1 mg Intravenous Given 07/18/22 2248)  HYDROmorphone (DILAUDID) injection 1 mg (1 mg Intravenous Given 07/18/22 2354)  ondansetron (ZOFRAN) injection 4 mg (4 mg Intravenous Given 07/19/22 0010)    ED Course/ Medical Decision Making/ A&P                           Medical Decision Making Amount and/or Complexity of Data Reviewed Labs: ordered.  Risk Prescription drug management.   Patient has been given IV dilaudid for pain. On repeat exams I have not noted any significant swelling or progression beyond the ankle. Initial screening labs are ok besides mild hypokalemia.  At this point does not meed criteria for crofab. Will continue to monitor. Care transferred to Dr. Bernette Mayers.         Final Clinical Impression(s) / ED Diagnoses Final diagnoses:  None    Rx / DC Orders ED Discharge Orders     None         Pricilla Loveless, MD 07/19/22 [phone]

## 2022-07-19 LAB — CBC WITH DIFFERENTIAL/PLATELET
Abs Immature Granulocytes: 0.01 10*3/uL (ref 0.00–0.07)
Basophils Absolute: 0 10*3/uL (ref 0.0–0.1)
Basophils Relative: 0 %
Eosinophils Absolute: 0.2 10*3/uL (ref 0.0–0.5)
Eosinophils Relative: 2 %
HCT: 35.5 % — ABNORMAL LOW (ref 36.0–46.0)
Hemoglobin: 12 g/dL (ref 12.0–15.0)
Immature Granulocytes: 0 %
Lymphocytes Relative: 25 %
Lymphs Abs: 2 10*3/uL (ref 0.7–4.0)
MCH: 29.9 pg (ref 26.0–34.0)
MCHC: 33.8 g/dL (ref 30.0–36.0)
MCV: 88.5 fL (ref 80.0–100.0)
Monocytes Absolute: 0.6 10*3/uL (ref 0.1–1.0)
Monocytes Relative: 8 %
Neutro Abs: 5.3 10*3/uL (ref 1.7–7.7)
Neutrophils Relative %: 65 %
Platelets: 303 10*3/uL (ref 150–400)
RBC: 4.01 MIL/uL (ref 3.87–5.11)
RDW: 13.2 % (ref 11.5–15.5)
WBC: 8.1 10*3/uL (ref 4.0–10.5)
nRBC: 0 % (ref 0.0–0.2)

## 2022-07-19 LAB — PROTIME-INR
INR: 1.1 (ref 0.8–1.2)
Prothrombin Time: 13.9 seconds (ref 11.4–15.2)

## 2022-07-19 LAB — FIBRINOGEN: Fibrinogen: 276 mg/dL (ref 210–475)

## 2022-07-19 LAB — POC URINE PREG, ED: Preg Test, Ur: NEGATIVE

## 2022-07-19 MED ORDER — TRAMADOL HCL 50 MG PO TABS: 100.0000 mg | ORAL_TABLET | Freq: Four times a day (QID) | ORAL | 0 refills | Status: DC | PRN

## 2022-07-19 MED ORDER — IBUPROFEN 600 MG PO TABS: 600.0000 mg | ORAL_TABLET | Freq: Three times a day (TID) | ORAL | 0 refills | Status: DC

## 2022-07-19 MED ORDER — HYDROMORPHONE HCL 1 MG/ML IJ SOLN
0.5000 mg | Freq: Once | INTRAMUSCULAR | Status: AC
  Administered 2022-07-19: 0.5 mg via INTRAVENOUS
  Filled 2022-07-19: qty 0.5

## 2022-07-19 MED ORDER — ACETAMINOPHEN 325 MG PO TABS
650.0000 mg | ORAL_TABLET | Freq: Once | ORAL | Status: AC
  Administered 2022-07-19: 650 mg via ORAL
  Filled 2022-07-19: qty 2

## 2022-07-19 MED ORDER — ONDANSETRON 4 MG PO TBDP
4.0000 mg | ORAL_TABLET | Freq: Once | ORAL | Status: DC
  Filled 2022-07-19: qty 1

## 2022-07-19 MED ORDER — ONDANSETRON HCL 4 MG/2ML IJ SOLN
4.0000 mg | Freq: Once | INTRAMUSCULAR | Status: AC
  Administered 2022-07-19: 4 mg via INTRAVENOUS
  Filled 2022-07-19: qty 2

## 2022-07-19 MED ORDER — ONDANSETRON 4 MG PO TBDP: 4.0000 mg | ORAL_TABLET | Freq: Three times a day (TID) | ORAL | 0 refills | Status: AC | PRN

## 2022-07-19 MED ORDER — ONDANSETRON HCL 4 MG/2ML IJ SOLN: 4.0000 mg | Freq: Once | INTRAMUSCULAR | Status: AC

## 2022-07-19 MED ORDER — ONDANSETRON HCL 4 MG/2ML IJ SOLN
INTRAMUSCULAR | Status: AC
  Administered 2022-07-19: 4 mg via INTRAVENOUS
  Filled 2022-07-19: qty 2

## 2022-07-19 MED ORDER — HYDROMORPHONE HCL 1 MG/ML IJ SOLN
0.5000 mg | Freq: Once | INTRAMUSCULAR | Status: DC
  Filled 2022-07-19: qty 0.5

## 2022-07-19 MED ORDER — HYDROMORPHONE HCL 1 MG/ML IJ SOLN
0.5000 mg | Freq: Once | INTRAMUSCULAR | Status: AC
  Administered 2022-07-19: 0.5 mg via INTRAMUSCULAR

## 2022-07-19 NOTE — ED Notes (Signed)
Rounding on pt. Pt feeling nauseated and having dry heaves.pt asking for something for a headache. EDP made aware and awaiting orders

## 2022-07-19 NOTE — ED Notes (Signed)
Lab at bedside

## 2022-07-19 NOTE — ED Provider Notes (Signed)
Care of the patient assumed at the change of shift. Here after a snake bite, observed for 8 hours with minimal change in foot swelling and no evidence of proximal progression. Labs have remained normal. She is resting comfortably in bed. Plan discharge with Rx for pain meds as needed. Rest, ice and elevated the foot. RTED for any other concerns.    Pollyann Savoy, MD 07/19/22 [phone]

## 2022-07-19 NOTE — ED Notes (Signed)
Guideline for Monitoring Snake Bites-Foot/Leg  Foot: 31.4cm Ankle: 22cm Calf: 41cm Thigh: 44.5cm   Foot swelling (bite site) increased swelling 1.4cm. No change in swelling to ankle, calf, and thigh. No groin tenderness.

## 2022-07-19 NOTE — ED Notes (Signed)
Guideline for Monitoring Snake Bites- Foot/Leg per Poison Control  Foot: 30cm Ankle: 22cm Calf: 41cm Thigh: 44.5cm  1cm increase in swelling to ankle and calf; 2cm increase in swelling to foot  Left leg elevated 3 pillows high and above heart level.

## 2022-07-19 NOTE — ED Notes (Signed)
Poison Control called and updated on pt's status  Recommended to observe for 8 hrs

## 2022-07-19 NOTE — ED Notes (Signed)
Daughter called regarding pt's disposition. This RN unable to provide information at this time due to daughter not listed in pt's record.

## 2022-07-19 NOTE — ED Notes (Signed)
Guideline for Monitoring Snake Bite-Foot/Leg  Foot: 31.4cm Ankle: 22cm Calf: 40.5cm Thigh: 44cm  No groin pain No change in swelling from last measurement

## 2022-07-19 NOTE — ED Notes (Signed)
Guideline Monitoring for Snake Bites- Foot/Leg from Poison Control  Foot: 28cm Ankle: 21cm Calf: 40cm  Pt has noted swelling to left foot where bite occurred. Only one puncture mark noted Slight bruising coloration to bite site No redness or streaking past ankle at this time  Left ankle is 3 pillows elevated above heart

## 2022-07-19 NOTE — ED Notes (Signed)
Pt states that she only needs half of the pain medicine that she has been giving. States that she "wants to maintain where she is at" pain wise. Upon entering pt's room, pt was asleep along with pt's husband who is asleep with her in the bed.

## 2022-07-19 NOTE — ED Notes (Addendum)
Pt c/o throat irritation. Pt states "feels like there is a rock in my throat". Pt states that she is able to swallow fine at this time. VS are stable and WNL. EDP made aware

## 2022-07-19 NOTE — ED Notes (Signed)
Pt given saltines and ginger ale. 

## 2022-07-19 NOTE — ED Notes (Signed)
Guideline for Monitoring Snake Bite- Foot/Leg    Foot: 31.5cm Ankle: 22.5cm Calf: 40.5cm Thigh: 44cm  No groin tenderness

## 2022-07-19 NOTE — ED Notes (Signed)
Pt taken to the bathroom via wheelchair per pt's request.

## 2022-07-19 NOTE — ED Notes (Addendum)
Pt O2 sats dropping as pt is trying to sleep; 86-91%. Placed on 2L [place] for comfort. O2 sat now 100%

## 2023-04-29 ENCOUNTER — Emergency Department (HOSPITAL_BASED_OUTPATIENT_CLINIC_OR_DEPARTMENT_OTHER)
Admission: EM | Admit: 2023-04-29 | Discharge: 2023-04-29 | Disposition: A | Discharge status: Home / Self Care | Payer: Medicaid Other | Attending: Emergency Medicine | Admitting: Emergency Medicine

## 2023-04-29 ENCOUNTER — Other Ambulatory Visit: Payer: Self-pay

## 2023-04-29 DIAGNOSIS — B9789 Other viral agents as the cause of diseases classified elsewhere: Secondary | ICD-10-CM | POA: Diagnosis not present

## 2023-04-29 DIAGNOSIS — J069 Acute upper respiratory infection, unspecified: Secondary | ICD-10-CM | POA: Diagnosis not present

## 2023-04-29 DIAGNOSIS — R300 Dysuria: Secondary | ICD-10-CM | POA: Diagnosis not present

## 2023-04-29 DIAGNOSIS — S022XXA Fracture of nasal bones, initial encounter for closed fracture: Secondary | ICD-10-CM | POA: Insufficient documentation

## 2023-04-29 DIAGNOSIS — S0993XA Unspecified injury of face, initial encounter: Secondary | ICD-10-CM | POA: Diagnosis present

## 2023-04-29 DIAGNOSIS — M545 Low back pain, unspecified: Secondary | ICD-10-CM | POA: Diagnosis not present

## 2023-04-29 DIAGNOSIS — S02401A Maxillary fracture, unspecified, initial encounter for closed fracture: Secondary | ICD-10-CM | POA: Diagnosis not present

## 2023-04-29 DIAGNOSIS — S0292XA Unspecified fracture of facial bones, initial encounter for closed fracture: Secondary | ICD-10-CM

## 2023-04-29 LAB — URINALYSIS, ROUTINE W REFLEX MICROSCOPIC
Bilirubin Urine: NEGATIVE
Glucose, UA: NEGATIVE mg/dL
Hgb urine dipstick: NEGATIVE
Ketones, ur: NEGATIVE mg/dL
Leukocytes,Ua: NEGATIVE
Nitrite: NEGATIVE
Protein, ur: NEGATIVE mg/dL
Specific Gravity, Urine: 1.024 (ref 1.005–1.030)
pH: 5.5 (ref 5.0–8.0)

## 2023-04-29 LAB — GROUP A STREP BY PCR: Group A Strep by PCR: NOT DETECTED

## 2023-04-29 MED ORDER — DEXAMETHASONE 4 MG PO TABS
6.0000 mg | ORAL_TABLET | Freq: Once | ORAL | Status: AC
  Administered 2023-04-29: 6 mg via ORAL
  Filled 2023-04-29: qty 2

## 2023-04-29 MED ORDER — BENZONATATE 200 MG PO CAPS
200.0000 mg | ORAL_CAPSULE | Freq: Three times a day (TID) | ORAL | 0 refills | Status: AC
Start: 2023-04-29 — End: 2023-05-09

## 2023-04-29 NOTE — ED Triage Notes (Signed)
Patient here POV from Home.  Endorses Sore Throat that began 2 days ago. Also notes Lower Back Pain for 2 Days.   Also notes having a Nasal Fracture recently that occurred 3-4 weeks ago and seeks Evaluation for Numbness to facial area.   NAD noted During Triage. A&Ox4. GCS 15. Ambulatory.
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
## 2023-04-29 NOTE — Discharge Instructions (Signed)
Home to rest and hydrate. Can use saline mist as previously instructed at last ER visit. Tessalon as needed as prescribed for cough.  Follow up with ENT- please call to schedule an appointment.  Follow up with primary care if urinary symptoms continue. Urinalysis does not meet criteria for culture tonight. Prior culture report reviewed and negative.

## 2023-07-26 ENCOUNTER — Emergency Department (HOSPITAL_BASED_OUTPATIENT_CLINIC_OR_DEPARTMENT_OTHER): Payer: Medicaid Other | Admitting: Radiology

## 2023-07-26 ENCOUNTER — Encounter (HOSPITAL_BASED_OUTPATIENT_CLINIC_OR_DEPARTMENT_OTHER): Payer: Self-pay

## 2023-07-26 ENCOUNTER — Emergency Department (HOSPITAL_BASED_OUTPATIENT_CLINIC_OR_DEPARTMENT_OTHER)
Admission: EM | Admit: 2023-07-26 | Discharge: 2023-07-26 | Disposition: A | Discharge status: Home / Self Care | Payer: Medicaid Other | Attending: Emergency Medicine | Admitting: Emergency Medicine

## 2023-07-26 ENCOUNTER — Other Ambulatory Visit: Payer: Self-pay

## 2023-07-26 DIAGNOSIS — N3000 Acute cystitis without hematuria: Secondary | ICD-10-CM | POA: Diagnosis not present

## 2023-07-26 DIAGNOSIS — S0011XA Contusion of right eyelid and periocular area, initial encounter: Secondary | ICD-10-CM | POA: Diagnosis not present

## 2023-07-26 DIAGNOSIS — S39012A Strain of muscle, fascia and tendon of lower back, initial encounter: Secondary | ICD-10-CM

## 2023-07-26 DIAGNOSIS — Z87442 Personal history of urinary calculi: Secondary | ICD-10-CM | POA: Insufficient documentation

## 2023-07-26 DIAGNOSIS — W1839XA Other fall on same level, initial encounter: Secondary | ICD-10-CM | POA: Diagnosis not present

## 2023-07-26 DIAGNOSIS — S3992XA Unspecified injury of lower back, initial encounter: Secondary | ICD-10-CM | POA: Diagnosis present

## 2023-07-26 LAB — URINALYSIS, W/ REFLEX TO CULTURE (INFECTION SUSPECTED)
Bacteria, UA: NONE SEEN
Bilirubin Urine: NEGATIVE
Glucose, UA: NEGATIVE mg/dL
Hgb urine dipstick: NEGATIVE
Ketones, ur: NEGATIVE mg/dL
Nitrite: POSITIVE — AB
Specific Gravity, Urine: 1.03 (ref 1.005–1.030)
WBC, UA: >50 WBC/hpf (ref 0–5)
pH: 5.5 (ref 5.0–8.0)

## 2023-07-26 MED ORDER — NITROFURANTOIN MONOHYD MACRO 100 MG PO CAPS: 100.0000 mg | ORAL_CAPSULE | Freq: Two times a day (BID) | ORAL | 0 refills | Status: AC

## 2023-07-26 MED ORDER — NAPROXEN 250 MG PO TABS
500.0000 mg | ORAL_TABLET | Freq: Once | ORAL | Status: AC
  Administered 2023-07-26: 500 mg via ORAL
  Filled 2023-07-26: qty 2

## 2023-07-26 MED ORDER — NAPROXEN 500 MG PO TABS: 500.0000 mg | ORAL_TABLET | Freq: Two times a day (BID) | ORAL | 0 refills | Status: AC

## 2023-07-26 MED ORDER — CEPHALEXIN 500 MG PO CAPS
500.0000 mg | ORAL_CAPSULE | Freq: Two times a day (BID) | ORAL | 0 refills | Status: DC
Start: 2023-07-26 — End: 2023-07-26

## 2023-07-26 MED ORDER — NITROFURANTOIN MONOHYD MACRO 100 MG PO CAPS
100.0000 mg | ORAL_CAPSULE | Freq: Once | ORAL | Status: AC
  Administered 2023-07-26: 100 mg via ORAL
  Filled 2023-07-26: qty 1

## 2023-07-26 MED ORDER — CEPHALEXIN 250 MG PO CAPS
500.0000 mg | ORAL_CAPSULE | Freq: Once | ORAL | Status: DC
  Filled 2023-07-26: qty 2

## 2023-07-26 NOTE — ED Triage Notes (Signed)
Pov, A&O x 4, gcs 15, amb to room  C/o right sided flank pain and urinary frequency x 2 days, hx of stones and UTI. Endorses nausea and not feeling well.

## 2023-07-26 NOTE — ED Notes (Signed)
Pt requested to speak with Dr. Bernette Mayers in another room while she left her cell phone on in exam 14. She conveyed this request by typing it out on her cell phone and showing Zong Mcquarrie RN the message. Pt appeared fearful of verbalizing this request. Pt moved to triage and Dr. Bernette Mayers to triage with Pt.
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
## 2023-07-28 LAB — URINE CULTURE: Culture: >=100000 — AB

## 2023-07-29 ENCOUNTER — Telehealth (HOSPITAL_BASED_OUTPATIENT_CLINIC_OR_DEPARTMENT_OTHER): Payer: Self-pay | Admitting: *Deleted

## 2023-07-29 NOTE — Telephone Encounter (Signed)
Post ED Visit - Positive Culture Follow-up: Successful Patient Follow-Up  Culture assessed and recommendations reviewed by:  []  Enzo Bi, Pharm.D. []  Celedonio Miyamoto, Pharm.D., BCPS AQ-ID []  Garvin Fila, Pharm.D., BCPS []  Georgina Pillion, Pharm.D., BCPS []  [place], [place].D., BCPS, AAHIVP []  Estella Husk, Pharm.D., BCPS, AAHIVP []  Lysle Pearl, PharmD, BCPS []  Phillips Climes, PharmD, BCPS []  Agapito Games, PharmD, BCPS [x] , Verdene Rio, PharmD  Positive urine culture  []  Patient discharged without antimicrobial prescription and treatment is now indicated [x]  Organism is intermediate to prescribed ED discharge antimicrobial []  Patient with positive blood cultures  Called pt for symptom check. Pt is still having burning and some pain when urinating.  Advised pt to stop Nitrofurantoin and I would call in new antibiotic  Changes discussed with ED provider: Theda Belfast, MD  New antibiotic prescription Cephalexin 500mg  BID x 5 days Called to [place], [place], [place]  Contacted patient, date 07/29/23 , time 9:40   Erica Larson 07/29/2023, 9:40 AM

## 2023-07-29 NOTE — Progress Notes (Signed)
ED Antimicrobial Stewardship Positive Culture Follow Up   Erica Larson is an 47 y.o. female who presented to [facility] on 07/26/2023 with a chief complaint of  Chief Complaint  Patient presents with   Dysuria   Back Pain    Recent Results (from the past 720 hour(s))  Urine Culture     Status: Abnormal   Collection Time: 07/26/23  2:04 AM   Specimen: Urine, Random  Result Value Ref Range Status   Specimen Description   Final    URINE, RANDOM Performed at [facility], [address], [place], [place] [address]    Special Requests   Final    NONE Reflexed from [id number] Performed at [facility], [address], [place], [place] [address]    Culture >=100,000 COLONIES/mL ESCHERICHIA COLI (A)  Final   Report Status 07/28/2023 FINAL  Final   Organism ID, Bacteria ESCHERICHIA COLI (A)  Final      Susceptibility   Escherichia coli - MIC*    AMPICILLIN >=32 RESISTANT Resistant     CEFAZOLIN <=4 SENSITIVE Sensitive     CEFEPIME <=0.12 SENSITIVE Sensitive     CEFTRIAXONE <=0.25 SENSITIVE Sensitive     CIPROFLOXACIN <=0.25 SENSITIVE Sensitive     GENTAMICIN <=1 SENSITIVE Sensitive     IMIPENEM <=0.25 SENSITIVE Sensitive     NITROFURANTOIN 64 INTERMEDIATE Intermediate     TRIMETH/SULFA >=320 RESISTANT Resistant     AMPICILLIN/SULBACTAM 16 INTERMEDIATE Intermediate     PIP/TAZO <=4 SENSITIVE Sensitive     * >=100,000 COLONIES/mL ESCHERICHIA COLI    [x]  Treated with Nitrofurantoin, organism intermediate to prescribed antimicrobial  Plan: RN to call patient to check on symptoms. If urinary symptoms present, stop Nitrofurantoin and start Cephalexin 500mg  BID x 5d.   ED Provider: Theda Belfast, MD   Verdene Rio 07/29/2023, 8:28 AM Clinical Pharmacist Monday - Friday phone -  [phone] Saturday - Sunday phone - [phone]

## 2023-11-02 ENCOUNTER — Encounter (HOSPITAL_BASED_OUTPATIENT_CLINIC_OR_DEPARTMENT_OTHER): Payer: Self-pay

## 2023-11-02 ENCOUNTER — Emergency Department (HOSPITAL_BASED_OUTPATIENT_CLINIC_OR_DEPARTMENT_OTHER)
Admission: EM | Admit: 2023-11-02 | Discharge: 2023-11-03 | Disposition: A | Discharge status: Home / Self Care | Payer: Medicaid Other | Attending: Emergency Medicine | Admitting: Emergency Medicine

## 2023-11-02 DIAGNOSIS — Z20822 Contact with and (suspected) exposure to covid-19: Secondary | ICD-10-CM | POA: Diagnosis not present

## 2023-11-02 DIAGNOSIS — R059 Cough, unspecified: Secondary | ICD-10-CM | POA: Diagnosis not present

## 2023-11-02 DIAGNOSIS — J069 Acute upper respiratory infection, unspecified: Secondary | ICD-10-CM | POA: Diagnosis not present

## 2023-11-02 LAB — RESP PANEL BY RT-PCR (RSV, FLU A&B, COVID)  RVPGX2
Influenza A by PCR: NEGATIVE
Influenza B by PCR: NEGATIVE
Resp Syncytial Virus by PCR: NEGATIVE
SARS Coronavirus 2 by RT PCR: NEGATIVE

## 2023-11-02 NOTE — ED Triage Notes (Signed)
Pt to triage c/o cough fever chills flu like symptoms x 3 days. PT has been taking OTC med without relief. VSS NAD on room air. Swab completed.

## 2023-11-03 MED ORDER — DOXYCYCLINE HYCLATE 100 MG PO CAPS: 100.0000 mg | ORAL_CAPSULE | Freq: Two times a day (BID) | ORAL | 0 refills | Status: AC

## 2023-11-03 NOTE — ED Provider Notes (Signed)
[place] EMERGENCY DEPARTMENT AT [facility] Provider Note   CSN: [id number] Arrival date & time: 11/02/23  2101     History  Chief Complaint  Patient presents with   Cough    Erica Larson is a 47 y.o. female.  Patient is a 47 year old female with past medical history of thyroiditis, bipolar, anxiety, restless legs.  Patient presenting today for evaluation of bodyaches, cough.  She tells me that her cough is reductive of a bloody sputum, but denies any chest pain or difficulty breathing.  She is also having some bleeding from the nose in the mornings.  She denies any ill contacts.  No aggravating or alleviating factors.  The history is provided by the patient.       Home Medications Prior to Admission medications   Medication Sig Start Date End Date Taking? Authorizing Provider  albuterol (VENTOLIN HFA) 108 (90 Base) MCG/ACT inhaler Inhale 1-2 puffs into the lungs every 6 (six) hours as needed for wheezing or shortness of breath. 01/26/22   Mickie Bail, NP  fluconazole (DIFLUCAN) 150 MG tablet Take 1 tablet (150 mg total) by mouth daily. -For your yeast infection, start the Diflucan (fluconazole)- Take one pill today (day 1). If you're still having symptoms in 3 days, take the second pill. 02/26/22   Rhys Martini, PA-C  gabapentin (NEURONTIN) 300 MG capsule Take 1 capsule (300 mg total) by mouth 3 (three) times daily. Patient taking differently: Take 300 mg by mouth 3 (three) times daily as needed (nerve pain). 12/06/19   Lomax, Amy, NP  levothyroxine (SYNTHROID) 75 MCG tablet Take 75 mcg by mouth daily before breakfast.    [provider]  naproxen (NAPROSYN) 500 MG tablet Take 1 tablet (500 mg total) by mouth 2 (two) times daily. 07/26/23   Pollyann Savoy, MD  nitrofurantoin, macrocrystal-monohydrate, (MACROBID) 100 MG capsule Take 1 capsule (100 mg total) by mouth 2 (two) times daily. 07/26/23   Pollyann Savoy, MD  ondansetron (ZOFRAN-ODT) 4 MG  disintegrating tablet Take 1 tablet (4 mg total) by mouth every 8 (eight) hours as needed for nausea or vomiting. 07/19/22   Pollyann Savoy, MD  rOPINIRole (REQUIP) 3 MG tablet Take 3 mg by mouth at bedtime.    [provider]  ARIPiprazole (ABILIFY) 5 MG tablet Take 1 tablet (5 mg total) by mouth daily. 10/29/17 12/06/19  Benjaman Pott, MD      Allergies    Compazine [prochlorperazine], Hydrocodone, and Oxycodone    Review of Systems   Review of Systems  All other systems reviewed and are negative.   Physical Exam Updated Vital Signs BP 121/85 (BP Location: Right Arm)   Pulse 83   Temp 98 F (36.7 C)   Resp 20   Wt 73.5 kg   LMP  (LMP Unknown)   SpO2 97%   BMI 26.96 kg/m  Physical Exam Vitals and nursing note reviewed.  Constitutional:      General: She is not in acute distress.    Appearance: She is well-developed. She is not diaphoretic.  HENT:     Head: Normocephalic and atraumatic.     Mouth/Throat:     Mouth: Mucous membranes are moist.     Pharynx: No oropharyngeal exudate or posterior oropharyngeal erythema.  Cardiovascular:     Rate and Rhythm: Normal rate and regular rhythm.     Heart sounds: No murmur heard.    No friction rub. No gallop.  Pulmonary:  Effort: Pulmonary effort is normal. No respiratory distress.     Breath sounds: Normal breath sounds. No wheezing.  Abdominal:     General: Bowel sounds are normal. There is no distension.     Palpations: Abdomen is soft.     Tenderness: There is no abdominal tenderness.  Musculoskeletal:        General: Normal range of motion.     Cervical back: Normal range of motion and neck supple.  Skin:    General: Skin is warm and dry.  Neurological:     General: No focal deficit present.     Mental Status: She is alert and oriented to person, place, and time.     ED Results / Procedures / Treatments   Labs (all labs ordered are listed, but only abnormal results are displayed) Labs Reviewed   RESP PANEL BY RT-PCR (RSV, FLU A&B, COVID)  [id number]    EKG None  Radiology No results found.  Procedures Procedures    Medications Ordered in ED Medications - No data to display  ED Course/ Medical Decision Making/ A&P  Patient presenting with URI symptoms as described in the HPI.  Her COVID/flu/RSV swab is negative.  I suspect a viral etiology, however she does describe some sinus pressure and occasional bloody sputum and bleeding from her nose.  It is possible this could be related to a sinus infection as well.  I will provide a prescription for an antibiotic if she is not improving or worsens in the next few days.  Final Clinical Impression(s) / ED Diagnoses Final diagnoses:  None    Rx / DC Orders ED Discharge Orders     None         Geoffery Lyons, MD 11/03/23 0101

## 2023-11-03 NOTE — Discharge Instructions (Signed)
Take over-the-counter medications as needed for relief of symptoms.  Drink plenty of fluids and get plenty of rest.  If you are not improving in the next 2 to 3 days, fill the prescription for doxycycline you were given this evening and begin taking this medication.

## 2024-02-04 ENCOUNTER — Ambulatory Visit

## 2024-02-26 DIAGNOSIS — N12 Tubulo-interstitial nephritis, not specified as acute or chronic: Secondary | ICD-10-CM | POA: Diagnosis not present

## 2024-02-26 DIAGNOSIS — R160 Hepatomegaly, not elsewhere classified: Secondary | ICD-10-CM | POA: Diagnosis not present

## 2024-02-26 DIAGNOSIS — N390 Urinary tract infection, site not specified: Secondary | ICD-10-CM | POA: Diagnosis not present

## 2024-02-26 DIAGNOSIS — Z888 Allergy status to other drugs, medicaments and biological substances status: Secondary | ICD-10-CM | POA: Diagnosis not present

## 2024-02-26 DIAGNOSIS — R109 Unspecified abdominal pain: Secondary | ICD-10-CM | POA: Diagnosis not present

## 2024-02-26 DIAGNOSIS — R112 Nausea with vomiting, unspecified: Secondary | ICD-10-CM | POA: Diagnosis not present

## 2024-03-21 DIAGNOSIS — H53149 Visual discomfort, unspecified: Secondary | ICD-10-CM | POA: Diagnosis not present

## 2024-03-21 DIAGNOSIS — R102 Pelvic and perineal pain: Secondary | ICD-10-CM | POA: Diagnosis not present

## 2024-03-21 DIAGNOSIS — G43909 Migraine, unspecified, not intractable, without status migrainosus: Secondary | ICD-10-CM | POA: Diagnosis not present

## 2024-03-21 DIAGNOSIS — D72829 Elevated white blood cell count, unspecified: Secondary | ICD-10-CM | POA: Diagnosis not present

## 2024-03-21 DIAGNOSIS — Z90710 Acquired absence of both cervix and uterus: Secondary | ICD-10-CM | POA: Diagnosis not present

## 2024-03-21 DIAGNOSIS — R112 Nausea with vomiting, unspecified: Secondary | ICD-10-CM | POA: Diagnosis not present

## 2024-03-21 DIAGNOSIS — H93239 Hyperacusis, unspecified ear: Secondary | ICD-10-CM | POA: Diagnosis not present

## 2024-03-21 DIAGNOSIS — R519 Headache, unspecified: Secondary | ICD-10-CM | POA: Diagnosis not present

## 2024-03-21 DIAGNOSIS — Z87440 Personal history of urinary (tract) infections: Secondary | ICD-10-CM | POA: Diagnosis not present

## 2024-03-21 DIAGNOSIS — R8271 Bacteriuria: Secondary | ICD-10-CM | POA: Diagnosis not present

## 2024-05-29 DIAGNOSIS — G2581 Restless legs syndrome: Secondary | ICD-10-CM | POA: Diagnosis not present

## 2024-05-29 DIAGNOSIS — N390 Urinary tract infection, site not specified: Secondary | ICD-10-CM | POA: Diagnosis not present

## 2024-05-29 DIAGNOSIS — N760 Acute vaginitis: Secondary | ICD-10-CM | POA: Diagnosis not present

## 2024-12-04 ENCOUNTER — Other Ambulatory Visit: Payer: Self-pay

## 2024-12-04 ENCOUNTER — Encounter (HOSPITAL_BASED_OUTPATIENT_CLINIC_OR_DEPARTMENT_OTHER): Payer: Self-pay | Admitting: Emergency Medicine

## 2024-12-04 ENCOUNTER — Emergency Department (HOSPITAL_BASED_OUTPATIENT_CLINIC_OR_DEPARTMENT_OTHER)
Admission: EM | Admit: 2024-12-04 | Discharge: 2024-12-04 | Disposition: A | Discharge status: Home / Self Care | Attending: Emergency Medicine | Admitting: Emergency Medicine

## 2024-12-04 DIAGNOSIS — N898 Other specified noninflammatory disorders of vagina: Secondary | ICD-10-CM | POA: Insufficient documentation

## 2024-12-04 DIAGNOSIS — M545 Low back pain, unspecified: Secondary | ICD-10-CM | POA: Diagnosis present

## 2024-12-04 LAB — URINALYSIS, ROUTINE W REFLEX MICROSCOPIC
Bilirubin Urine: NEGATIVE
Glucose, UA: NEGATIVE mg/dL
Hgb urine dipstick: NEGATIVE
Ketones, ur: NEGATIVE mg/dL
Leukocytes,Ua: NEGATIVE
Nitrite: NEGATIVE
Protein, ur: NEGATIVE mg/dL
Specific Gravity, Urine: 1.005 (ref 1.005–1.030)
pH: 6 (ref 5.0–8.0)

## 2024-12-04 LAB — WET PREP, GENITAL
Clue Cells Wet Prep HPF POC: NONE SEEN
Sperm: NONE SEEN
Trich, Wet Prep: NONE SEEN
WBC, Wet Prep HPF POC: <10
Yeast Wet Prep HPF POC: NONE SEEN
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
## 2024-12-04 NOTE — Discharge Instructions (Signed)
 Are seen in the emergency department for your vaginal irritation.  Your urine was negative for urinary tract infection and we did send a urine culture.  Your wet prep was negative for yeast, trichomonas and BV.  We did send off STI testing so if your culture or other testing comes back positive you should expect a call.  You did have an area of skin breakdown and irritation on your vulva and can use Vaseline as a barrier cream to help this heal.  You should follow-up with your GYN to have your symptoms rechecked and make sure this heals appropriately.  You should return to the emergency department with any new or concerning symptoms.

## 2024-12-04 NOTE — ED Provider Notes (Signed)
 " [place] EMERGENCY DEPARTMENT AT [facility] Provider Note   CSN: [id number] Arrival date & time: 12/04/24  1830     Patient presents with: Vaginal Pain   Erica Larson is a 49 y.o. female.   Patient is a 49 year old female with a history of hysterectomy presenting to the emergency department with vaginal discomfort.  The patient reports that she woke up this morning with some cramping in her low back which she states that she frequently gets with UTIs.  She states that in the shower she noticed what looked to be like an ulcer on her vulva.  She states that she has not had any dysuria or hematuria or abnormal vaginal discharge.  She denies any new sexual partners.  She states that she has had some nausea recently but attributes this to being on a weight loss injectable.  The history is provided by the patient.  Vaginal Pain       Prior to Admission medications  Medication Sig Start Date End Date Taking? Authorizing Provider  albuterol  (VENTOLIN  HFA) 108 (90 Base) MCG/ACT inhaler Inhale 1-2 puffs into the lungs every 6 (six) hours as needed for wheezing or shortness of breath. 01/26/22   Corlis Burnard DEL, NP  doxycycline  (VIBRAMYCIN ) 100 MG capsule Take 1 capsule (100 mg total) by mouth 2 (two) times daily. One po bid x 7 days 11/03/23   Geroldine Berg, MD  fluconazole  (DIFLUCAN ) 150 MG tablet Take 1 tablet (150 mg total) by mouth daily. -For your yeast infection, start the Diflucan  (fluconazole )- Take one pill today (day 1). If you're still having symptoms in 3 days, take the second pill. 02/26/22   Graham, Laura E, PA-C  gabapentin  (NEURONTIN ) 300 MG capsule Take 1 capsule (300 mg total) by mouth 3 (three) times daily. Patient taking differently: Take 300 mg by mouth 3 (three) times daily as needed (nerve pain). 12/06/19   Lomax, Amy, NP  levothyroxine  (SYNTHROID ) 75 MCG tablet Take 75 mcg by mouth daily before breakfast.    [provider]  naproxen  (NAPROSYN ) 500 MG  tablet Take 1 tablet (500 mg total) by mouth 2 (two) times daily. 07/26/23   Roselyn Carlin NOVAK, MD  nitrofurantoin , macrocrystal-monohydrate, (MACROBID ) 100 MG capsule Take 1 capsule (100 mg total) by mouth 2 (two) times daily. 07/26/23   Roselyn Carlin NOVAK, MD  ondansetron  (ZOFRAN -ODT) 4 MG disintegrating tablet Take 1 tablet (4 mg total) by mouth every 8 (eight) hours as needed for nausea or vomiting. 07/19/22   Roselyn Carlin NOVAK, MD  rOPINIRole  (REQUIP ) 3 MG tablet Take 3 mg by mouth at bedtime.    [provider]  ARIPiprazole  (ABILIFY ) 5 MG tablet Take 1 tablet (5 mg total) by mouth daily. 10/29/17 12/06/19  Waddell Elna BIRCH, MD    Allergies: Compazine [prochlorperazine], Hydrocodone , and Oxycodone     Review of Systems  Genitourinary:  Positive for vaginal pain.    Updated Vital Signs BP 124/88   Pulse 82   Temp 98.2 F (36.8 C) (Oral)   Resp 18   Ht 5' 5 (1.651 m)   Wt 74.8 kg   LMP  (LMP Unknown)   SpO2 100%   BMI 27.46 kg/m   Physical Exam Vitals and nursing note reviewed.  Constitutional:      General: She is not in acute distress.    Appearance: Normal appearance.  HENT:     Head: Normocephalic and atraumatic.     Nose: Nose normal.     Mouth/Throat:  Mouth: Mucous membranes are moist.  Eyes:     Extraocular Movements: Extraocular movements intact.  Cardiovascular:     Rate and Rhythm: Normal rate and regular rhythm.     Heart sounds: Normal heart sounds.  Pulmonary:     Effort: Pulmonary effort is normal.     Breath sounds: Normal breath sounds.  Abdominal:     General: Abdomen is flat.     Palpations: Abdomen is soft.     Tenderness: There is no abdominal tenderness.  Musculoskeletal:        General: Normal range of motion.     Cervical back: Normal range of motion.  Skin:    General: Skin is warm and dry.  Neurological:     Mental Status: She is alert and oriented to person, place, and time.  Psychiatric:        Mood and Affect: Mood  normal.        Behavior: Behavior normal.     (all labs ordered are listed, but only abnormal results are displayed) Labs Reviewed  URINALYSIS, ROUTINE W REFLEX MICROSCOPIC - Abnormal; Notable for the following components:      Result Value   Color, Urine COLORLESS (*)    All other components within normal limits  WET PREP, GENITAL  URINE CULTURE  SYPHILIS: RPR W/REFLEX TO RPR TITER AND TREPONEMAL ANTIBODIES, TRADITIONAL SCREENING AND DIAGNOSIS ALGORITHM  HIV ANTIBODY (ROUTINE TESTING W REFLEX)  GC/CHLAMYDIA PROBE AMP ([place]) NOT AT [facility]    EKG: None  Radiology: No results found.   Procedures   Medications Ordered in the ED - No data to display  Clinical Course as of 12/04/24 2230  Sat Dec 04, 2024  2143 Pelvic exam chaparoned by NT - showed skin breakdown and irritation at the proximal aspect between L labia majora and minora, no ulceration, no blistering. Small amount of white discharge in the vaginal vault.  [VK]  2148 Wet prep negative. Patient recommended vaseline barrier and outpatient follow up. [VK]    Clinical Course User Index [VK] Kingsley, Brandyn Lowrey K, DO                                 Medical Decision Making This patient presents to the ED with chief complaint(s) of vaginal lesion with pertinent past medical history of hysterectomy which further complicates the presenting complaint. The complaint involves an extensive differential diagnosis and also carries with it a high risk of complications and morbidity.    The differential diagnosis includes herpes, STI, UTI, laceration or abrasion, cellulitis, abscess  Additional history obtained: Additional history obtained from N/A Records reviewed Care Everywhere/External Records  ED Course and Reassessment: On patient's arrival she is hemodynamically stable in no acute distress.  Had urine sent from triage which was negative for UTI.  She states that her urine is usually negative at the beginning of a UTI  and requested urine culture to be sent.  Patient will additionally have pelvic exam to evaluate for STI as well as etiology of her vulvar lesion.  Independent labs interpretation:  The following labs were independently interpreted: UA and wet prep negative  Independent visualization of imaging: -N/A  Consultation: - Consulted or discussed management/test interpretation w/ external professional: N/A  Consideration for admission or further workup: Patient has no emergent conditions requiring admission or further work-up at this time and is stable for discharge home with primary care follow-up  Social Determinants  of health: N/A    Amount and/or Complexity of Data Reviewed Labs: ordered.       Final diagnoses:  Vaginal irritation    ED Discharge Orders     None          Ellouise Richerd POUR, DO 12/04/24 2230  "

## 2024-12-05 LAB — HIV ANTIBODY (ROUTINE TESTING W REFLEX): HIV Screen 4th Generation wRfx: NONREACTIVE

## 2024-12-05 LAB — SYPHILIS: RPR W/REFLEX TO RPR TITER AND TREPONEMAL ANTIBODIES, TRADITIONAL SCREENING AND DIAGNOSIS ALGORITHM: RPR Ser Ql: NONREACTIVE

## 2024-12-06 LAB — GC/CHLAMYDIA PROBE AMP (~~LOC~~) NOT AT ARMC
Chlamydia: NEGATIVE
Comment: NEGATIVE
Comment: NORMAL
Neisseria Gonorrhea: NEGATIVE

## 2024-12-06 LAB — URINE CULTURE: Culture: <10000 — AB
